# Patient Record
Sex: Male | Born: 1980 | Race: White | Hispanic: No | Marital: Single | Smoking: Current every day smoker
Health system: Southern US, Community
[De-identification: ages and names within clinical notes are randomized; demographics above are authoritative.]

## PROBLEM LIST (undated history)

## (undated) VITALS — BP 120/79 | HR 101 | Temp 98.5°F | Resp 18 | Ht 68.0 in | Wt 196.0 lb

## (undated) DIAGNOSIS — Z21 Asymptomatic human immunodeficiency virus [HIV] infection status: Secondary | ICD-10-CM

## (undated) DIAGNOSIS — Z9089 Acquired absence of other organs: Secondary | ICD-10-CM

## (undated) DIAGNOSIS — S3282XA Multiple fractures of pelvis without disruption of pelvic ring, initial encounter for closed fracture: Secondary | ICD-10-CM

## (undated) DIAGNOSIS — Z8781 Personal history of (healed) traumatic fracture: Secondary | ICD-10-CM

## (undated) DIAGNOSIS — Z87828 Personal history of other (healed) physical injury and trauma: Secondary | ICD-10-CM

## (undated) DIAGNOSIS — F1120 Opioid dependence, uncomplicated: Secondary | ICD-10-CM

## (undated) DIAGNOSIS — B20 Human immunodeficiency virus [HIV] disease: Secondary | ICD-10-CM

## (undated) DIAGNOSIS — M1990 Unspecified osteoarthritis, unspecified site: Secondary | ICD-10-CM

## (undated) DIAGNOSIS — F32A Depression, unspecified: Secondary | ICD-10-CM

## (undated) DIAGNOSIS — N200 Calculus of kidney: Secondary | ICD-10-CM

## (undated) DIAGNOSIS — F329 Major depressive disorder, single episode, unspecified: Secondary | ICD-10-CM

## (undated) DIAGNOSIS — IMO0002 Reserved for concepts with insufficient information to code with codable children: Secondary | ICD-10-CM

## (undated) DIAGNOSIS — Z5189 Encounter for other specified aftercare: Secondary | ICD-10-CM

## (undated) DIAGNOSIS — F99 Mental disorder, not otherwise specified: Secondary | ICD-10-CM

## (undated) HISTORY — PX: HIP FRACTURE SURGERY: SHX118

## (undated) HISTORY — DX: Personal history of (healed) traumatic fracture: Z87.81

## (undated) HISTORY — DX: Personal history of other (healed) physical injury and trauma: Z87.828

## (undated) HISTORY — DX: Acquired absence of other organs: Z90.89

## (undated) HISTORY — DX: Multiple fractures of pelvis without disruption of pelvic ring, initial encounter for closed fracture: S32.82XA

## (undated) HISTORY — PX: SPLENECTOMY: SUR1306

## (undated) HISTORY — DX: Reserved for concepts with insufficient information to code with codable children: IMO0002

## (undated) HISTORY — PX: TONSILLECTOMY: SUR1361

---
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
## 2005-05-19 DIAGNOSIS — IMO0001 Reserved for inherently not codable concepts without codable children: Secondary | ICD-10-CM

## 2005-05-19 DIAGNOSIS — Z5189 Encounter for other specified aftercare: Secondary | ICD-10-CM

## 2005-05-19 HISTORY — DX: Reserved for inherently not codable concepts without codable children: IMO0001

## 2005-05-19 HISTORY — DX: Encounter for other specified aftercare: Z51.89

## 2008-05-06 ENCOUNTER — Emergency Department (HOSPITAL_BASED_OUTPATIENT_CLINIC_OR_DEPARTMENT_OTHER): Admission: EM | Admit: 2008-05-06 | Discharge: 2008-05-06 | Payer: Self-pay | Admitting: Emergency Medicine

## 2008-05-06 ENCOUNTER — Ambulatory Visit: Payer: Self-pay | Admitting: Diagnostic Radiology

## 2009-05-09 ENCOUNTER — Ambulatory Visit: Payer: Self-pay | Admitting: Diagnostic Radiology

## 2009-05-09 ENCOUNTER — Ambulatory Visit (HOSPITAL_BASED_OUTPATIENT_CLINIC_OR_DEPARTMENT_OTHER): Admission: RE | Admit: 2009-05-09 | Discharge: 2009-05-09 | Payer: Self-pay | Admitting: Family Medicine

## 2009-08-09 ENCOUNTER — Emergency Department (HOSPITAL_BASED_OUTPATIENT_CLINIC_OR_DEPARTMENT_OTHER): Admission: EM | Admit: 2009-08-09 | Discharge: 2009-08-10 | Payer: Self-pay | Admitting: Emergency Medicine

## 2009-08-10 ENCOUNTER — Ambulatory Visit: Payer: Self-pay | Admitting: Interventional Radiology

## 2010-01-07 ENCOUNTER — Ambulatory Visit: Payer: Self-pay | Admitting: Diagnostic Radiology

## 2010-01-07 ENCOUNTER — Emergency Department (HOSPITAL_BASED_OUTPATIENT_CLINIC_OR_DEPARTMENT_OTHER): Admission: EM | Admit: 2010-01-07 | Discharge: 2010-01-07 | Payer: Self-pay | Admitting: Emergency Medicine

## 2010-08-02 LAB — POCT CARDIAC MARKERS
CKMB, poc: <1 ng/mL — ABNORMAL LOW (ref 1.0–8.0)
Myoglobin, poc: 36.4 ng/mL (ref 12–200)
Troponin i, poc: <0.05 ng/mL (ref 0.00–0.09)

## 2010-08-02 LAB — DIFFERENTIAL
Basophils Relative: 3 % — ABNORMAL HIGH (ref 0–1)
Eosinophils Absolute: 0.1 10*3/uL (ref 0.0–0.7)
Eosinophils Relative: 2 % (ref 0–5)
Lymphocytes Relative: 52 % — ABNORMAL HIGH (ref 12–46)
Monocytes Absolute: 0.4 10*3/uL (ref 0.1–1.0)
Monocytes Relative: 6 % (ref 3–12)
Neutrophils Relative %: 37 % — ABNORMAL LOW (ref 43–77)

## 2010-08-02 LAB — BASIC METABOLIC PANEL
BUN: 14 mg/dL (ref 6–23)
CO2: 28 mEq/L (ref 19–32)
Calcium: 9.2 mg/dL (ref 8.4–10.5)
Creatinine, Ser: 1 mg/dL (ref 0.4–1.5)
GFR calc Af Amer: >60 mL/min (ref >60)
Glucose, Bld: 75 mg/dL (ref 70–99)
Potassium: 3.8 mEq/L (ref 3.5–5.1)

## 2010-08-02 LAB — CBC
Hemoglobin: 15.4 g/dL (ref 13.0–17.0)
Platelets: 220 10*3/uL (ref 150–400)
RDW: 13.2 % (ref 11.5–15.5)

## 2010-08-12 LAB — BASIC METABOLIC PANEL
CO2: 27 mEq/L (ref 19–32)
GFR calc Af Amer: >60 mL/min (ref >60)
Glucose, Bld: 86 mg/dL (ref 70–99)
Sodium: 145 mEq/L (ref 135–145)

## 2010-08-12 LAB — CBC
HCT: 42.1 % (ref 39.0–52.0)
Hemoglobin: 14.5 g/dL (ref 13.0–17.0)
MCHC: 34.5 g/dL (ref 30.0–36.0)
MCV: 99.7 fL (ref 78.0–100.0)
Platelets: 238 10*3/uL (ref 150–400)
RBC: 4.22 MIL/uL (ref 4.22–5.81)
RDW: 12.8 % (ref 11.5–15.5)
WBC: 9.4 K/uL (ref 4.0–10.5)

## 2010-08-12 LAB — URINALYSIS, ROUTINE W REFLEX MICROSCOPIC
Bilirubin Urine: NEGATIVE
Glucose, UA: NEGATIVE mg/dL
Hgb urine dipstick: NEGATIVE
Ketones, ur: NEGATIVE mg/dL
Nitrite: NEGATIVE
Protein, ur: NEGATIVE mg/dL
Specific Gravity, Urine: 1.012 (ref 1.005–1.030)
Urobilinogen, UA: 1 mg/dL (ref 0.0–1.0)
pH: 7 (ref 5.0–8.0)

## 2010-08-12 LAB — DIFFERENTIAL
Basophils Absolute: 0.1 K/uL (ref 0.0–0.1)
Basophils Relative: 1 % (ref 0–1)
Eosinophils Absolute: 0.1 K/uL (ref 0.0–0.7)
Eosinophils Relative: 1 % (ref 0–5)
Lymphocytes Relative: 39 % (ref 12–46)
Lymphs Abs: 3.6 10*3/uL (ref 0.7–4.0)
Monocytes Absolute: 0.7 10*3/uL (ref 0.1–1.0)
Monocytes Relative: 8 % (ref 3–12)
Neutro Abs: 4.9 10*3/uL (ref 1.7–7.7)
Neutrophils Relative %: 52 % (ref 43–77)

## 2010-08-12 LAB — BASIC METABOLIC PANEL WITH GFR
BUN: 10 mg/dL (ref 6–23)
Calcium: 9.3 mg/dL (ref 8.4–10.5)
Chloride: 108 meq/L (ref 96–112)
Creatinine, Ser: 1 mg/dL (ref 0.4–1.5)
GFR calc non Af Amer: >60 mL/min (ref >60)
Potassium: 3.9 meq/L (ref 3.5–5.1)

## 2011-02-18 ENCOUNTER — Encounter: Payer: Self-pay | Admitting: *Deleted

## 2011-02-18 ENCOUNTER — Emergency Department (HOSPITAL_BASED_OUTPATIENT_CLINIC_OR_DEPARTMENT_OTHER)
Admission: EM | Admit: 2011-02-18 | Discharge: 2011-02-18 | Disposition: A | Discharge status: Home / Self Care | Payer: 59 | Attending: Emergency Medicine | Admitting: Emergency Medicine

## 2011-02-18 DIAGNOSIS — Z21 Asymptomatic human immunodeficiency virus [HIV] infection status: Secondary | ICD-10-CM | POA: Insufficient documentation

## 2011-02-18 DIAGNOSIS — IMO0001 Reserved for inherently not codable concepts without codable children: Secondary | ICD-10-CM | POA: Insufficient documentation

## 2011-02-18 DIAGNOSIS — F1120 Opioid dependence, uncomplicated: Secondary | ICD-10-CM | POA: Insufficient documentation

## 2011-02-18 DIAGNOSIS — F17200 Nicotine dependence, unspecified, uncomplicated: Secondary | ICD-10-CM | POA: Insufficient documentation

## 2011-02-18 DIAGNOSIS — R110 Nausea: Secondary | ICD-10-CM | POA: Insufficient documentation

## 2011-02-18 HISTORY — DX: Opioid dependence, uncomplicated: F11.20

## 2011-02-18 HISTORY — DX: Asymptomatic human immunodeficiency virus (hiv) infection status: Z21

## 2011-02-18 HISTORY — DX: Human immunodeficiency virus (HIV) disease: B20

## 2011-02-18 LAB — URINALYSIS, ROUTINE W REFLEX MICROSCOPIC
Bilirubin Urine: NEGATIVE
Hgb urine dipstick: NEGATIVE
Specific Gravity, Urine: 1.01 (ref 1.005–1.030)
pH: 7.5 (ref 5.0–8.0)

## 2011-02-18 LAB — RAPID URINE DRUG SCREEN, HOSP PERFORMED
Barbiturates: NOT DETECTED
Tetrahydrocannabinol: NOT DETECTED

## 2011-02-18 MED ORDER — OXYCODONE-ACETAMINOPHEN 5-325 MG PO TABS
2.0000 | ORAL_TABLET | Freq: Once | ORAL | Status: AC
  Administered 2011-02-18: 2 via ORAL
  Filled 2011-02-18: qty 2

## 2011-02-18 MED ORDER — OXYCODONE-ACETAMINOPHEN 5-325 MG PO TABS: 1.0000 | ORAL_TABLET | Freq: Three times a day (TID) | ORAL | Status: AC

## 2011-02-18 NOTE — ED Notes (Signed)
Pt. To see PA Lovenia Kim tomarrow.  Pt. In no distress and is not suicidal per Pt.

## 2011-02-18 NOTE — ED Provider Notes (Signed)
History     CSN: [id number] Arrival date & time: 02/18/2011 12:47 PM  Chief Complaint  Patient presents with  . Addiction Problem    (Consider location/radiation/quality/duration/timing/severity/associated sxs/prior treatment) HPI Comments: Pt states that he has been on percocet 10 three times a day for 1 year and 3 days ago he decided he didn't want to take them anymore so he flushed time down the toilet:pt states that feels horrible and he is supposed to see his doctor tomorrow, but with how anxious he felt today he thought he needed to be seen  Patient is a 30 y.o. male presenting with drug problem. The history is provided by the patient. No language interpreter was used.  Drug Problem This is a new problem. The current episode started in the past 7 days. The problem occurs constantly. Associated symptoms include diaphoresis, myalgias and nausea. Pertinent negatives include no vomiting. The symptoms are aggravated by nothing. He has tried nothing for the symptoms.  Drug Problem This is a new problem. The current episode started in the past 7 days. The problem occurs constantly. The symptoms are aggravated by nothing. He has tried nothing for the symptoms.    Past Medical History  Diagnosis Date  . HIV (human immunodeficiency virus infection)   . Narcotic addiction     Past Surgical History  Procedure Date  . Splenectomy     No family history on file.  History  Substance Use Topics  . Smoking status: Current Some Day Smoker  . Smokeless tobacco: Not on file  . Alcohol Use: No      Review of Systems  Constitutional: Positive for diaphoresis.  Gastrointestinal: Positive for nausea. Negative for vomiting.  Musculoskeletal: Positive for myalgias.  All other systems reviewed and are negative.    Allergies  Review of patient's allergies indicates not on file.  Home Medications  No current outpatient prescriptions on file.  BP 128/92  Pulse 133  Temp(Src) 98.3 F  (36.8 C) (Oral)  Resp 24  SpO2 99%  Physical Exam  Nursing note and vitals reviewed. Constitutional: He is oriented to person, place, and time. He appears well-developed and well-nourished.  HENT:  Head: Normocephalic and atraumatic.  Eyes: Pupils are equal, round, and reactive to light.  Cardiovascular: Normal rate and regular rhythm.   Pulmonary/Chest: Effort normal and breath sounds normal.  Abdominal: Soft. Bowel sounds are normal.  Musculoskeletal: Normal range of motion.  Neurological: He is alert and oriented to person, place, and time.  Skin: He is diaphoretic.  Psychiatric: His mood appears anxious.    ED Course  Procedures (including critical care time)  Labs Reviewed  URINE RAPID DRUG SCREEN (HOSP PERFORMED) - Abnormal; Notable for the following:    Benzodiazepines POSITIVE (*)    All other components within normal limits  URINALYSIS, ROUTINE W REFLEX MICROSCOPIC   No results found.   No diagnosis found.    MDM  Pt is feeling better after the medications:pt is supposed to see pcp tomorrow as I talked with pcp and they are to come up with a plan for the pt to get of the medications        Teressa Lower, NP 02/18/11 1456  Medical screening examination/treatment/procedure(s) were performed by non-physician practitioner and as supervising physician I was immediately available for consultation/collaboration.  Ethelda Chick, MD 02/18/11 [phone]

## 2011-02-18 NOTE — ED Notes (Signed)
States he is trying to get off Percocet. Has been taking 3 tablets everyday for the past year. Has not had any x 3 days. Feels shaky and anxious.

## 2011-02-18 NOTE — ED Notes (Signed)
Md canceled blood order

## 2011-02-21 LAB — RAPID STREP SCREEN (MED CTR MEBANE ONLY): Streptococcus, Group A Screen (Direct): NEGATIVE

## 2011-06-04 ENCOUNTER — Other Ambulatory Visit: Payer: Self-pay

## 2011-06-04 ENCOUNTER — Encounter (HOSPITAL_BASED_OUTPATIENT_CLINIC_OR_DEPARTMENT_OTHER): Payer: Self-pay | Admitting: *Deleted

## 2011-06-04 ENCOUNTER — Emergency Department (HOSPITAL_BASED_OUTPATIENT_CLINIC_OR_DEPARTMENT_OTHER)
Admission: EM | Admit: 2011-06-04 | Discharge: 2011-06-05 | Disposition: A | Discharge status: Home / Self Care | Payer: 59 | Attending: Emergency Medicine | Admitting: Emergency Medicine

## 2011-06-04 ENCOUNTER — Emergency Department (INDEPENDENT_AMBULATORY_CARE_PROVIDER_SITE_OTHER): Payer: 59

## 2011-06-04 DIAGNOSIS — F3289 Other specified depressive episodes: Secondary | ICD-10-CM | POA: Insufficient documentation

## 2011-06-04 DIAGNOSIS — R45851 Suicidal ideations: Secondary | ICD-10-CM

## 2011-06-04 DIAGNOSIS — F1910 Other psychoactive substance abuse, uncomplicated: Secondary | ICD-10-CM

## 2011-06-04 DIAGNOSIS — Z0389 Encounter for observation for other suspected diseases and conditions ruled out: Secondary | ICD-10-CM

## 2011-06-04 DIAGNOSIS — G8929 Other chronic pain: Secondary | ICD-10-CM | POA: Insufficient documentation

## 2011-06-04 DIAGNOSIS — F19939 Other psychoactive substance use, unspecified with withdrawal, unspecified: Secondary | ICD-10-CM | POA: Insufficient documentation

## 2011-06-04 DIAGNOSIS — Z79899 Other long term (current) drug therapy: Secondary | ICD-10-CM | POA: Insufficient documentation

## 2011-06-04 DIAGNOSIS — F1920 Other psychoactive substance dependence, uncomplicated: Secondary | ICD-10-CM | POA: Insufficient documentation

## 2011-06-04 DIAGNOSIS — Z21 Asymptomatic human immunodeficiency virus [HIV] infection status: Secondary | ICD-10-CM | POA: Insufficient documentation

## 2011-06-04 DIAGNOSIS — F329 Major depressive disorder, single episode, unspecified: Secondary | ICD-10-CM

## 2011-06-04 LAB — URINALYSIS, ROUTINE W REFLEX MICROSCOPIC
Ketones, ur: NEGATIVE mg/dL
Leukocytes, UA: NEGATIVE
Nitrite: NEGATIVE
Protein, ur: NEGATIVE mg/dL

## 2011-06-04 LAB — COMPREHENSIVE METABOLIC PANEL
ALT: 15 U/L (ref 0–53)
AST: 16 U/L (ref 0–37)
CO2: 25 mEq/L (ref 19–32)
Calcium: 9.6 mg/dL (ref 8.4–10.5)
Sodium: 141 mEq/L (ref 135–145)
Total Protein: 7.3 g/dL (ref 6.0–8.3)

## 2011-06-04 LAB — CBC
MCV: 95 fL (ref 78.0–100.0)
Platelets: 283 10*3/uL (ref 150–400)
RDW: 13.2 % (ref 11.5–15.5)
WBC: 12.3 10*3/uL — ABNORMAL HIGH (ref 4.0–10.5)

## 2011-06-04 LAB — DIFFERENTIAL
Basophils Absolute: 0 10*3/uL (ref 0.0–0.1)
Lymphs Abs: 7 10*3/uL — ABNORMAL HIGH (ref 0.7–4.0)
Monocytes Relative: 4 % (ref 3–12)
Neutrophils Relative %: 38 % — ABNORMAL LOW (ref 43–77)

## 2011-06-04 LAB — RAPID URINE DRUG SCREEN, HOSP PERFORMED
Amphetamines: NOT DETECTED
Barbiturates: NOT DETECTED
Benzodiazepines: POSITIVE — AB

## 2011-06-04 MED ORDER — ACETAMINOPHEN 325 MG PO TABS
650.0000 mg | ORAL_TABLET | Freq: Once | ORAL | Status: AC
  Administered 2011-06-04: 650 mg via ORAL
  Filled 2011-06-04: qty 2

## 2011-06-04 MED ORDER — LORAZEPAM 1 MG PO TABS
1.0000 mg | ORAL_TABLET | Freq: Once | ORAL | Status: AC
  Administered 2011-06-04: 1 mg via ORAL
  Filled 2011-06-04: qty 1
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
## 2011-06-04 NOTE — ED Notes (Signed)
[facility] here to evaluate pt now.

## 2011-06-04 NOTE — ED Notes (Signed)
Pt also sts he is having thoughts "that I wold be better off if I weren't here." Pt denies plan or desire to actually harm himself.

## 2011-06-04 NOTE — ED Notes (Signed)
Spoke with Dewayne Hatch from [facility]. Pt information given and will await return call.

## 2011-06-04 NOTE — ED Notes (Signed)
MD in to evaluate pt now.

## 2011-06-04 NOTE — ED Notes (Signed)
Pt sts he is addicted to percocet and wants to get off of them. He sts he ran out and is having body aches, nausea and sweats. Pt sts he called his PMD for a referral but he was not in the office.
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
## 2011-06-05 DIAGNOSIS — R45851 Suicidal ideations: Secondary | ICD-10-CM

## 2011-06-05 NOTE — ED Provider Notes (Signed)
12:40 AM   Has been seen by counsellor.  NO SI or HI.  Interested in pain mgt. Program and will see his pmd tomorrow.    Also has outpt psychiatrist.  Stable for dc.    Twylia Oka A. Patrica Duel, MD 06/05/11 0040

## 2011-06-05 NOTE — ED Notes (Signed)
[facility] counselor in with pt at this time

## 2011-06-27 ENCOUNTER — Other Ambulatory Visit: Payer: Self-pay | Admitting: Urology

## 2011-06-30 ENCOUNTER — Encounter (HOSPITAL_COMMUNITY): Payer: Self-pay | Admitting: *Deleted

## 2011-06-30 NOTE — Pre-Procedure Instructions (Signed)
Arrive in [facility] at 1530 on day of procedure,patient to follow laxative instructions in blue folder. Npo after midnight for solid food, clear liquids until 0900 .am on day of procedure then npo except sip with meds. Not to take any aspirin , ibuprofen, vitamins ,mobic until after the procedure. To bring driver, picture ID, insurance info, blue folder to short stay.. patient verbalized  Understanding of instructions.

## 2011-07-03 ENCOUNTER — Encounter (HOSPITAL_COMMUNITY)
Admission: RE | Disposition: A | Discharge status: Home / Self Care | Payer: Self-pay | Source: Ambulatory Visit | Attending: Urology

## 2011-07-03 ENCOUNTER — Ambulatory Visit (HOSPITAL_COMMUNITY): Payer: 59

## 2011-07-03 ENCOUNTER — Ambulatory Visit (HOSPITAL_COMMUNITY)
Admission: RE | Admit: 2011-07-03 | Discharge: 2011-07-03 | Disposition: A | Discharge status: Home / Self Care | Payer: 59 | Source: Ambulatory Visit | Attending: Urology | Admitting: Urology

## 2011-07-03 ENCOUNTER — Encounter (HOSPITAL_COMMUNITY): Payer: Self-pay | Admitting: *Deleted

## 2011-07-03 DIAGNOSIS — Z87891 Personal history of nicotine dependence: Secondary | ICD-10-CM | POA: Insufficient documentation

## 2011-07-03 DIAGNOSIS — F329 Major depressive disorder, single episode, unspecified: Secondary | ICD-10-CM | POA: Insufficient documentation

## 2011-07-03 DIAGNOSIS — F411 Generalized anxiety disorder: Secondary | ICD-10-CM | POA: Insufficient documentation

## 2011-07-03 DIAGNOSIS — B20 Human immunodeficiency virus [HIV] disease: Secondary | ICD-10-CM | POA: Insufficient documentation

## 2011-07-03 DIAGNOSIS — R12 Heartburn: Secondary | ICD-10-CM | POA: Insufficient documentation

## 2011-07-03 DIAGNOSIS — N200 Calculus of kidney: Secondary | ICD-10-CM | POA: Insufficient documentation

## 2011-07-03 DIAGNOSIS — Z79899 Other long term (current) drug therapy: Secondary | ICD-10-CM | POA: Insufficient documentation

## 2011-07-03 DIAGNOSIS — F3289 Other specified depressive episodes: Secondary | ICD-10-CM | POA: Insufficient documentation

## 2011-07-03 HISTORY — DX: Calculus of kidney: N20.0

## 2011-07-03 HISTORY — DX: Encounter for other specified aftercare: Z51.89

## 2011-07-03 HISTORY — DX: Unspecified osteoarthritis, unspecified site: M19.90

## 2011-07-03 SURGERY — LITHOTRIPSY, ESWL
Anesthesia: LOCAL | Laterality: Left

## 2011-07-03 MED ORDER — CIPROFLOXACIN HCL 500 MG PO TABS
500.0000 mg | ORAL_TABLET | ORAL | Status: AC
  Administered 2011-07-03: 500 mg via ORAL

## 2011-07-03 MED ORDER — DIPHENHYDRAMINE HCL 25 MG PO CAPS
25.0000 mg | ORAL_CAPSULE | Freq: Once | ORAL | Status: AC
  Administered 2011-07-03: 25 mg via ORAL

## 2011-07-03 MED ORDER — DEXTROSE-NACL 5-0.45 % IV SOLN
Freq: Once | INTRAVENOUS | Status: AC
  Administered 2011-07-03: 16:00:00 via INTRAVENOUS

## 2011-07-03 MED ORDER — DIAZEPAM 5 MG PO TABS
10.0000 mg | ORAL_TABLET | Freq: Once | ORAL | Status: AC
  Administered 2011-07-03: 10 mg via ORAL

## 2011-07-03 MED ORDER — DIAZEPAM 5 MG PO TABS: 10.0000 mg | ORAL_TABLET | ORAL | Status: DC

## 2011-07-03 MED ORDER — DEXTROSE-NACL 5-0.45 % IV SOLN: INTRAVENOUS | Status: DC

## 2011-07-03 MED ORDER — DIPHENHYDRAMINE HCL 25 MG PO CAPS
ORAL_CAPSULE | ORAL | Status: AC
  Administered 2011-07-03: 25 mg via ORAL
  Filled 2011-07-03: qty 1

## 2011-07-03 MED ORDER — DIPHENHYDRAMINE HCL 25 MG PO CAPS: 25.0000 mg | ORAL_CAPSULE | ORAL | Status: DC

## 2011-07-03 MED ORDER — DIAZEPAM 5 MG PO TABS
ORAL_TABLET | ORAL | Status: AC
  Administered 2011-07-03: 10 mg via ORAL
  Filled 2011-07-03: qty 2

## 2011-07-03 MED ORDER — CIPROFLOXACIN HCL 500 MG PO TABS: 500.0000 mg | ORAL_TABLET | ORAL | Status: DC

## 2011-07-03 MED ORDER — CIPROFLOXACIN HCL 500 MG PO TABS
ORAL_TABLET | ORAL | Status: AC
  Administered 2011-07-03: 500 mg via ORAL
  Filled 2011-07-03: qty 1

## 2011-07-03 NOTE — H&P (Signed)
History of Present Illness  Devin Crawford was seen in the ER for moderately severe left flank pain on 1/30.  CT scan revealed a 1.3 cm calculus in the upper pole of the left kidney.  He had similar symptom in March 2012.  CT scan then showed a 9 mm left upper pole renal calculus.  He still complains of left flank pain associated with frequency, dysuria.  Urinalysis shows 21-50 RBC's, 0-3 WBC's.  I reviewed the CT scan from a year ago.  I will request a copy of the most recent CT at [facility].   Past Medical History Problems  1. History of  Anxiety (Symptom) 300.00 2. History of  Depression 311 3. History of  Heartburn 787.1 4. History of  HIV Infection 042  Surgical History Problems  1. History of  Splenectomy  Current Meds 1. Abilify 2 MG Oral Tablet; Therapy: (Recorded:07Feb2013) to 2. Etodolac 400 MG Oral Tablet; Therapy: (Recorded:07Feb2013) to 3. FentaNYL 25 MCG/HR Transdermal Patch 72 Hour; Therapy: (Recorded:07Feb2013) to 4. Kaletra 200-50 MG Oral Tablet; Therapy: (Recorded:07Feb2013) to 5. Multi-Vitamin TABS; Therapy: (Recorded:07Feb2013) to 6. Omeprazole 40 MG Oral Capsule Delayed Release; Therapy: (Recorded:17Jan2011) to 7. Truvada 200-300 MG Oral Tablet; Therapy: (Recorded:07Feb2013) to 8. Tylenol TABS; Therapy: (Recorded:07Feb2013) to 9. Xanax 1 MG Oral Tablet; Therapy: (Recorded:07Feb2013) to 10. Zoloft 100 MG Oral Tablet; Therapy: (Recorded:07Feb2013) to  Allergies Medication  1. Morphine Derivatives 2. Penicillins 3. Sulfa Drugs  Family History Problems  1. Paternal history of  Family Health Status - Father's Age 40 2. Paternal history of  Family Health Status - Mother's Age 72 3. Family history of  Nephrolithiasis  Social History Problems  1. Caffeine Use 2-3 per day 2. Marital History - Single 3. Tobacco Use 305.1 1 pk in 2 wks, quit 1 yr ago Denied  4. History of  Alcohol Use  Review of Systems  As per HPI.  Everything else is unchanged since  06/04/09.     Vitals Vital Signs [Data Includes: Last 1 Day]  07Feb2013 10:25AM  BMI Calculated: 28.09 BSA Calculated: 1.93 Height: 5 ft 7 in Weight: 179 lb  Blood Pressure: 130 / 85 Heart Rate: 112 Respiration: 18  Physical Exam Constitutional: Well nourished and well developed . No acute distress.  ENT:. The ears and nose are normal in appearance.  Neck: The appearance of the neck is normal and no neck mass is present.  Pulmonary: No respiratory distress and normal respiratory rhythm and effort.  Cardiovascular: Heart rate and rhythm are normal . No peripheral edema.  Abdomen: The abdomen is soft and nontender. No masses are palpated. No CVA tenderness. No hernias are palpable. No hepatosplenomegaly noted.  Genitourinary: Examination of the penis demonstrates a normal meatus. The penis is circumcised. The scrotum is normal in appearance. The right epididymis is palpably normal. The left epididymis is enlarged. The right testis is palpably normal. The left testis is normal.  Lymphatics: The femoral and inguinal nodes are not enlarged or tender.  Skin: Normal skin turgor, no visible rash and no visible skin lesions.  Neuro/Psych:. Mood and affect are appropriate.    Results/Data Urine [Data Includes: Last 1 Day]   07Feb2013  COLOR YELLOW   APPEARANCE CLEAR   SPECIFIC GRAVITY 1.025   pH 6.0   GLUCOSE NEG mg/dL  BILIRUBIN NEG   KETONE NEG mg/dL  BLOOD LARGE   PROTEIN TRACE mg/dL  UROBILINOGEN 0.2 mg/dL  NITRITE NEG   LEUKOCYTE ESTERASE NEG   SQUAMOUS EPITHELIAL/HPF RARE  WBC 0-3 WBC/hpf  RBC 21-50 RBC/hpf  BACTERIA RARE   CRYSTALS Calcium Oxalate crystals noted   CASTS NONE SEEN    Assessment Assessed  1. Nephrolithiasis Of The Left Kidney 592.0 2. Ultrasound Scrotal Epididymis Left Cyst (___ Cm)  Plan Health Maintenance (V70.0)  1. UA With REFLEX  Done: 07Feb2013 10:07AM Nephrolithiasis Of The Left Kidney (592.0)  2. Hydrocodone-Acetaminophen 5-500 MG Oral  Tablet; Take by mouth 1 OR 2 tablets every 4-6  hours as needed for pain; Therapy: 07Feb2013 to (Last Rx:07Feb2013) 3. Follow-up Schedule Surgery Office  Follow-up  Done: 07Feb2013   He needs ESL of the renal calculus.  The procedure, risks, benefits were explained to the patient.  The risks include but are not limited to hemorrhage, renal or perirenal hematoma, injury to adjacent organs, steinstrasse.  He understands and wishes to proceed.   Signatures  CC: Dr Silvestre Moment  Electronically signed by : Su Grand, M.D.; Jun 26 2011 11:09AM

## 2011-07-03 NOTE — Discharge Instructions (Signed)

## 2011-07-05 ENCOUNTER — Encounter (HOSPITAL_COMMUNITY): Payer: Self-pay | Admitting: *Deleted

## 2011-07-05 ENCOUNTER — Observation Stay (HOSPITAL_COMMUNITY)
Admission: EM | Admit: 2011-07-05 | Discharge: 2011-07-06 | DRG: 669 | Disposition: A | Discharge status: Home / Self Care | Payer: 59 | Attending: Urology | Admitting: Urology

## 2011-07-05 DIAGNOSIS — Z7982 Long term (current) use of aspirin: Secondary | ICD-10-CM | POA: Insufficient documentation

## 2011-07-05 DIAGNOSIS — G8929 Other chronic pain: Secondary | ICD-10-CM | POA: Insufficient documentation

## 2011-07-05 DIAGNOSIS — Z21 Asymptomatic human immunodeficiency virus [HIV] infection status: Secondary | ICD-10-CM | POA: Insufficient documentation

## 2011-07-05 DIAGNOSIS — R109 Unspecified abdominal pain: Secondary | ICD-10-CM | POA: Insufficient documentation

## 2011-07-05 DIAGNOSIS — R112 Nausea with vomiting, unspecified: Secondary | ICD-10-CM | POA: Insufficient documentation

## 2011-07-05 DIAGNOSIS — N1330 Unspecified hydronephrosis: Secondary | ICD-10-CM | POA: Insufficient documentation

## 2011-07-05 DIAGNOSIS — Z79899 Other long term (current) drug therapy: Secondary | ICD-10-CM | POA: Insufficient documentation

## 2011-07-05 DIAGNOSIS — R000 Tachycardia, unspecified: Secondary | ICD-10-CM | POA: Insufficient documentation

## 2011-07-05 DIAGNOSIS — N201 Calculus of ureter: Principal | ICD-10-CM | POA: Insufficient documentation

## 2011-07-05 DIAGNOSIS — Z880 Allergy status to penicillin: Secondary | ICD-10-CM | POA: Insufficient documentation

## 2011-07-05 DIAGNOSIS — Z885 Allergy status to narcotic agent status: Secondary | ICD-10-CM | POA: Insufficient documentation

## 2011-07-05 DIAGNOSIS — F17200 Nicotine dependence, unspecified, uncomplicated: Secondary | ICD-10-CM | POA: Insufficient documentation

## 2011-07-05 DIAGNOSIS — N200 Calculus of kidney: Secondary | ICD-10-CM | POA: Insufficient documentation

## 2011-07-05 NOTE — ED Notes (Signed)
Pt c/o L flank pain and nausea since 2000. Pt is s/p lithotripsy x 2 days ago.

## 2011-07-06 ENCOUNTER — Encounter (HOSPITAL_COMMUNITY)
Admission: EM | Disposition: A | Discharge status: Home / Self Care | Payer: Self-pay | Source: Home / Self Care | Attending: Emergency Medicine

## 2011-07-06 ENCOUNTER — Emergency Department (HOSPITAL_COMMUNITY): Payer: 59

## 2011-07-06 ENCOUNTER — Encounter (HOSPITAL_COMMUNITY): Payer: Self-pay | Admitting: Certified Registered Nurse Anesthetist

## 2011-07-06 ENCOUNTER — Inpatient Hospital Stay (HOSPITAL_COMMUNITY): Payer: 59 | Admitting: Certified Registered Nurse Anesthetist

## 2011-07-06 LAB — URINE MICROSCOPIC-ADD ON

## 2011-07-06 LAB — BASIC METABOLIC PANEL
BUN: 10 mg/dL (ref 6–23)
Chloride: 101 mEq/L (ref 96–112)
Creatinine, Ser: 1.07 mg/dL (ref 0.50–1.35)
GFR calc Af Amer: >90 mL/min (ref >90)

## 2011-07-06 LAB — URINALYSIS, ROUTINE W REFLEX MICROSCOPIC
Bilirubin Urine: NEGATIVE
Ketones, ur: NEGATIVE mg/dL
Nitrite: NEGATIVE
Urobilinogen, UA: 0.2 mg/dL (ref 0.0–1.0)
pH: 6.5 (ref 5.0–8.0)

## 2011-07-06 SURGERY — CYSTOURETEROSCOPY, WITH RETROGRADE PYELOGRAM AND STENT INSERTION
Anesthesia: General | Site: Ureter | Laterality: Left | Wound class: Clean Contaminated

## 2011-07-06 MED ORDER — EMTRICITABINE-TENOFOVIR DF 200-300 MG PO TABS
1.0000 | ORAL_TABLET | Freq: Every day | ORAL | Status: DC
  Filled 2011-07-06 (×2): qty 1

## 2011-07-06 MED ORDER — CIPROFLOXACIN HCL 500 MG PO TABS: 500.0000 mg | ORAL_TABLET | Freq: Two times a day (BID) | ORAL | Status: AC

## 2011-07-06 MED ORDER — CIPROFLOXACIN IN D5W 400 MG/200ML IV SOLN
400.0000 mg | Freq: Once | INTRAVENOUS | Status: AC
  Administered 2011-07-06: 400 mg via INTRAVENOUS
  Filled 2011-07-06: qty 200

## 2011-07-06 MED ORDER — MELOXICAM 7.5 MG PO TABS
7.5000 mg | ORAL_TABLET | Freq: Every day | ORAL | Status: DC
  Filled 2011-07-06 (×2): qty 1

## 2011-07-06 MED ORDER — FENTANYL CITRATE 0.05 MG/ML IJ SOLN
INTRAMUSCULAR | Status: DC | PRN
  Administered 2011-07-06 (×2): 50 ug via INTRAVENOUS

## 2011-07-06 MED ORDER — HYDROMORPHONE HCL PF 1 MG/ML IJ SOLN
1.0000 mg | Freq: Once | INTRAMUSCULAR | Status: AC
  Administered 2011-07-06: 1 mg via INTRAVENOUS
  Filled 2011-07-06: qty 1

## 2011-07-06 MED ORDER — ALPRAZOLAM 0.5 MG PO TABS
ORAL_TABLET | ORAL | Status: AC
  Administered 2011-07-06: 0.5 mg
  Filled 2011-07-06: qty 1

## 2011-07-06 MED ORDER — HYDROMORPHONE HCL PF 1 MG/ML IJ SOLN: 1.0000 mg | INTRAMUSCULAR | Status: DC | PRN

## 2011-07-06 MED ORDER — PROMETHAZINE HCL 25 MG/ML IJ SOLN: 6.2500 mg | INTRAMUSCULAR | Status: DC | PRN

## 2011-07-06 MED ORDER — ONDANSETRON HCL 4 MG/2ML IJ SOLN
4.0000 mg | Freq: Once | INTRAMUSCULAR | Status: AC
  Administered 2011-07-06: 4 mg via INTRAVENOUS

## 2011-07-06 MED ORDER — LACTATED RINGERS IV SOLN
INTRAVENOUS | Status: DC | PRN
  Administered 2011-07-06: 14:00:00 via INTRAVENOUS

## 2011-07-06 MED ORDER — CIPROFLOXACIN IN D5W 400 MG/200ML IV SOLN
INTRAVENOUS | Status: DC | PRN
  Administered 2011-07-06: 400 mg via INTRAVENOUS

## 2011-07-06 MED ORDER — LIDOCAINE HCL (CARDIAC) 20 MG/ML IV SOLN
INTRAVENOUS | Status: DC | PRN
  Administered 2011-07-06: 100 mg via INTRAVENOUS

## 2011-07-06 MED ORDER — SODIUM CHLORIDE 0.9 % IR SOLN
Status: DC | PRN
  Administered 2011-07-06: 4000 mL

## 2011-07-06 MED ORDER — HYDROCODONE-ACETAMINOPHEN 5-325 MG PO TABS: 1.0000 | ORAL_TABLET | Freq: Four times a day (QID) | ORAL | Status: DC | PRN

## 2011-07-06 MED ORDER — TAMSULOSIN HCL 0.4 MG PO CAPS
0.4000 mg | ORAL_CAPSULE | Freq: Every day | ORAL | Status: DC
  Filled 2011-07-06 (×2): qty 1

## 2011-07-06 MED ORDER — FENTANYL CITRATE 0.05 MG/ML IJ SOLN
INTRAMUSCULAR | Status: AC
  Filled 2011-07-06: qty 2

## 2011-07-06 MED ORDER — DEXAMETHASONE SODIUM PHOSPHATE 10 MG/ML IJ SOLN
INTRAMUSCULAR | Status: DC | PRN
  Administered 2011-07-06: 10 mg via INTRAVENOUS

## 2011-07-06 MED ORDER — OXYCODONE HCL 5 MG PO TABS: 5.0000 mg | ORAL_TABLET | ORAL | Status: AC | PRN

## 2011-07-06 MED ORDER — ONDANSETRON HCL 4 MG/2ML IJ SOLN
INTRAMUSCULAR | Status: DC | PRN
  Administered 2011-07-06: 4 mg via INTRAVENOUS

## 2011-07-06 MED ORDER — ADULT MULTIVITAMIN W/MINERALS CH
1.0000 | ORAL_TABLET | Freq: Every day | ORAL | Status: DC
  Filled 2011-07-06 (×2): qty 1

## 2011-07-06 MED ORDER — 0.9 % SODIUM CHLORIDE (POUR BTL) OPTIME
TOPICAL | Status: DC | PRN
  Administered 2011-07-06: 1000 mL

## 2011-07-06 MED ORDER — ACETAMINOPHEN 10 MG/ML IV SOLN
INTRAVENOUS | Status: AC
  Filled 2011-07-06: qty 100

## 2011-07-06 MED ORDER — IBUPROFEN 800 MG PO TABS: 400.0000 mg | ORAL_TABLET | Freq: Four times a day (QID) | ORAL | Status: DC | PRN

## 2011-07-06 MED ORDER — URIBEL 118 MG PO CAPS: 1.0000 | ORAL_CAPSULE | Freq: Four times a day (QID) | ORAL | Status: DC | PRN

## 2011-07-06 MED ORDER — IOHEXOL 300 MG/ML  SOLN
INTRAMUSCULAR | Status: DC | PRN
  Administered 2011-07-06: 50 mL

## 2011-07-06 MED ORDER — IOHEXOL 300 MG/ML  SOLN
INTRAMUSCULAR | Status: AC
  Filled 2011-07-06: qty 1

## 2011-07-06 MED ORDER — KETAMINE HCL 10 MG/ML IJ SOLN
INTRAMUSCULAR | Status: DC | PRN
  Administered 2011-07-06: 25 mg via INTRAVENOUS

## 2011-07-06 MED ORDER — PANTOPRAZOLE SODIUM 40 MG PO TBEC
40.0000 mg | DELAYED_RELEASE_TABLET | Freq: Every day | ORAL | Status: DC
  Filled 2011-07-06: qty 1

## 2011-07-06 MED ORDER — SODIUM CHLORIDE 0.9 % IV SOLN
INTRAVENOUS | Status: AC
  Administered 2011-07-06: 150 mL/h via INTRAVENOUS

## 2011-07-06 MED ORDER — ALFUZOSIN HCL ER 10 MG PO TB24: 10.0000 mg | ORAL_TABLET | Freq: Every day | ORAL | Status: DC

## 2011-07-06 MED ORDER — KETOROLAC TROMETHAMINE 30 MG/ML IJ SOLN
INTRAMUSCULAR | Status: AC
  Filled 2011-07-06: qty 1

## 2011-07-06 MED ORDER — ONDANSETRON HCL 4 MG/2ML IJ SOLN
4.0000 mg | Freq: Three times a day (TID) | INTRAMUSCULAR | Status: AC | PRN
  Administered 2011-07-06: 4 mg via INTRAVENOUS
  Filled 2011-07-06 (×2): qty 2

## 2011-07-06 MED ORDER — SERTRALINE HCL 100 MG PO TABS
200.0000 mg | ORAL_TABLET | Freq: Every day | ORAL | Status: DC
  Filled 2011-07-06 (×2): qty 2

## 2011-07-06 MED ORDER — SUCCINYLCHOLINE CHLORIDE 20 MG/ML IJ SOLN
INTRAMUSCULAR | Status: DC | PRN
  Administered 2011-07-06: 100 mg via INTRAVENOUS

## 2011-07-06 MED ORDER — PROPOFOL 10 MG/ML IV EMUL
INTRAVENOUS | Status: DC | PRN
  Administered 2011-07-06: 150 mg via INTRAVENOUS

## 2011-07-06 MED ORDER — HYDROMORPHONE HCL PF 1 MG/ML IJ SOLN
1.0000 mg | INTRAMUSCULAR | Status: DC | PRN
  Administered 2011-07-06: 1 mg via INTRAVENOUS
  Filled 2011-07-06: qty 1

## 2011-07-06 MED ORDER — ACETAMINOPHEN 500 MG PO TABS: 500.0000 mg | ORAL_TABLET | Freq: Four times a day (QID) | ORAL | Status: DC | PRN

## 2011-07-06 MED ORDER — LOPINAVIR-RITONAVIR 200-50 MG PO TABS
2.0000 | ORAL_TABLET | Freq: Two times a day (BID) | ORAL | Status: DC
  Filled 2011-07-06 (×4): qty 2

## 2011-07-06 MED ORDER — ACETAMINOPHEN 10 MG/ML IV SOLN
INTRAVENOUS | Status: DC | PRN
  Administered 2011-07-06: 1000 mg via INTRAVENOUS

## 2011-07-06 MED ORDER — ONDANSETRON HCL 4 MG/2ML IJ SOLN
4.0000 mg | Freq: Once | INTRAMUSCULAR | Status: AC
  Administered 2011-07-06: 4 mg via INTRAVENOUS
  Filled 2011-07-06: qty 2

## 2011-07-06 MED ORDER — FENTANYL CITRATE 0.05 MG/ML IJ SOLN
25.0000 ug | INTRAMUSCULAR | Status: DC | PRN
  Administered 2011-07-06 (×3): 50 ug via INTRAVENOUS

## 2011-07-06 MED ORDER — HYDROMORPHONE HCL 2 MG PO TABS
4.0000 mg | ORAL_TABLET | ORAL | Status: DC | PRN
  Administered 2011-07-06: 4 mg via ORAL
  Filled 2011-07-06 (×2): qty 2

## 2011-07-06 MED ORDER — HYDROMORPHONE HCL PF 1 MG/ML IJ SOLN
1.0000 mg | INTRAMUSCULAR | Status: DC
  Administered 2011-07-06 (×2): 1 mg via INTRAVENOUS
  Filled 2011-07-06 (×2): qty 1

## 2011-07-06 MED ORDER — KETOROLAC TROMETHAMINE 30 MG/ML IJ SOLN
30.0000 mg | Freq: Once | INTRAMUSCULAR | Status: AC
  Administered 2011-07-06: 30 mg via INTRAVENOUS

## 2011-07-06 MED ORDER — CIPROFLOXACIN IN D5W 400 MG/200ML IV SOLN
INTRAVENOUS | Status: AC
  Filled 2011-07-06: qty 200

## 2011-07-06 MED ORDER — FENTANYL 25 MCG/HR TD PT72: 25.0000 ug | MEDICATED_PATCH | TRANSDERMAL | Status: DC

## 2011-07-06 MED ORDER — MIDAZOLAM HCL 5 MG/5ML IJ SOLN
INTRAMUSCULAR | Status: DC | PRN
  Administered 2011-07-06: 2 mg via INTRAVENOUS

## 2011-07-06 SURGICAL SUPPLY — 17 items
BAG URO CATCHER STRL LF (DRAPE) ×3 IMPLANT
BASKET ZERO TIP NITINOL 2.4FR (BASKET) ×2 IMPLANT
BSKT STON RTRVL ZERO TP 2.4FR (BASKET) ×2
CATH URET 5FR 28IN OPEN ENDED (CATHETERS) ×3 IMPLANT
CLOTH BEACON ORANGE TIMEOUT ST (SAFETY) ×3 IMPLANT
DRAPE CAMERA CLOSED 9X96 (DRAPES) ×3 IMPLANT
GLIDEWIRE 0.025 SS STRAIGHT (WIRE) ×2 IMPLANT
GLOVE SURG SS PI 8.0 STRL IVOR (GLOVE) ×3 IMPLANT
GOWN PREVENTION PLUS XLARGE (GOWN DISPOSABLE) ×3 IMPLANT
GOWN STRL REIN XL XLG (GOWN DISPOSABLE) ×3 IMPLANT
GUIDEWIRE STR DUAL SENSOR (WIRE) ×2 IMPLANT
LASER FIBER DISP (UROLOGICAL SUPPLIES) ×2 IMPLANT
MANIFOLD NEPTUNE II (INSTRUMENTS) ×3 IMPLANT
MARKER SKIN DUAL TIP RULER LAB (MISCELLANEOUS) ×3 IMPLANT
PACK CYSTO (CUSTOM PROCEDURE TRAY) ×3 IMPLANT
STENT CONTOUR URETERAL (STENTS) ×2 IMPLANT
TUBING CONNECTING 10 (TUBING) ×3 IMPLANT

## 2011-07-06 NOTE — Progress Notes (Addendum)
Patient returned from OR. Vital signs stable.  Patient states he feels better after procedure.  Patient urinated 700 ml of pink-light red urine.  Tolerating sprite and water.

## 2011-07-06 NOTE — ED Provider Notes (Signed)
History     CSN: [id number]  Arrival date & time 07/05/11  2319   First MD Initiated Contact with Patient 07/05/11 2334      Chief Complaint  Patient presents with  . Flank Pain    L flank s/p lithotripsy 07/03/11  . Nausea    (Consider location/radiation/quality/duration/timing/severity/associated sxs/prior treatment) HPI Comments: 31 year old male with a history of left-sided large kidney stone. He presents with acute onset of left flank pain radiating to the left lower abdomen. This is colicky in nature, severe at worse, associated with nausea and vomiting and persistent. He states that approximately 2 days ago he had lithotripsy performed at this hospital and has been passing a small amount of sediment since that time. He denies any dysuria fevers. He has been taking hydromorphone at home with good pain relief. His symptoms were acute in onset this evening, nothing makes it better or worse and he did note hematuria prior to arrival  Patient is a 31 y.o. male presenting with flank pain. The history is provided by the patient.  Flank Pain This is a new problem.    Past Medical History  Diagnosis Date  . HIV (human immunodeficiency virus infection)   . Blood transfusion   . Arthritis   . Kidney calculus   . Narcotic addiction     Past Surgical History  Procedure Date  . Splenectomy   . Hip fracture surgery   . Tonsillectomy     Family History  Problem Relation Age of Onset  . Multiple sclerosis Mother   . Stroke Father     History  Substance Use Topics  . Smoking status: Current Some Day Smoker    Types: Cigarettes  . Smokeless tobacco: Not on file  . Alcohol Use: No      Review of Systems  Genitourinary: Positive for flank pain.  All other systems reviewed and are negative.    Allergies  Morphine and related; Penicillins; Sulfa antibiotics; and Capzasin  Home Medications   Current Outpatient Rx  Name Route Sig Dispense Refill  . ACETAMINOPHEN 500  MG PO TABS Oral Take 500 mg by mouth every 6 (six) hours as needed. For pain    . ALPRAZOLAM 1 MG PO TABS Oral Take 1 mg by mouth every 4 (four) hours as needed. For anxiety    . ASPIRIN-ACETAMINOPHEN 500-325 MG PO PACK Oral Take 1 Package by mouth daily as needed. PAIN    . EMTRICITABINE-TENOFOVIR 200-300 MG PO TABS Oral Take 1 tablet by mouth daily.    . FENTANYL 25 MCG/HR TD PT72 Transdermal Place 1 patch onto the skin 2 days.    [name] HYDROCODONE-ACETAMINOPHEN 5-500 MG PO TABS Oral Take 1 tablet by mouth every 6 (six) hours as needed.    [name] HYDROMORPHONE HCL 4 MG PO TABS Oral Take 4 mg by mouth every 4 (four) hours as needed. FOR PAIN    . IBUPROFEN 200 MG PO TABS Oral Take 400 mg by mouth every 6 (six) hours as needed.    [name] LOPINAVIR-RITONAVIR 200-50 MG PO TABS Oral Take 2 tablets by mouth 2 (two) times daily.    . MELOXICAM 7.5 MG PO TABS Oral Take 7.5 mg by mouth daily.    . ADULT MULTIVITAMIN W/MINERALS CH Oral Take 1 tablet by mouth daily.    [name] OMEPRAZOLE 20 MG PO CPDR Oral Take 20 mg by mouth daily.    [name] PSEUDOEPHEDRINE HCL ER 120 MG PO TB12 Oral Take 120 mg by mouth  every 12 (twelve) hours.    . SERTRALINE HCL 100 MG PO TABS Oral Take 200 mg by mouth daily.      BP 133/82  Pulse 68  Temp(Src) 97.7 F (36.5 C) (Oral)  Resp 16  Ht 5\' 8"  (1.727 m)  Wt 179 lb (81.194 kg)  BMI 27.22 kg/m2  SpO2 98%  Physical Exam  Nursing note and vitals reviewed. Constitutional: He appears well-developed and well-nourished. No distress.  HENT:  Head: Normocephalic and atraumatic.  Mouth/Throat: Oropharynx is clear and moist. No oropharyngeal exudate.  Eyes: Conjunctivae and EOM are normal. Pupils are equal, round, and reactive to light. Right eye exhibits no discharge. Left eye exhibits no discharge. No scleral icterus.  Neck: Normal range of motion. Neck supple. No JVD present. No thyromegaly present.  Cardiovascular: Normal rate, regular rhythm, normal heart sounds and intact distal pulses.   Exam reveals no gallop and no friction rub.   No murmur heard. Pulmonary/Chest: Effort normal and breath sounds normal. No respiratory distress. He has no wheezes. He has no rales.  Abdominal: Soft. Bowel sounds are normal. He exhibits no distension and no mass. There is no tenderness ( No reproducible tenderness to palpation).       CVA tenderness on the left  Musculoskeletal: Normal range of motion. He exhibits no edema and no tenderness.  Lymphadenopathy:    He has no cervical adenopathy.  Neurological: He is alert. Coordination normal.  Skin: Skin is warm and dry. No rash noted. No erythema.  Psychiatric: He has a normal mood and affect. His behavior is normal.    ED Course  Procedures (including critical care time)  Labs Reviewed  URINALYSIS, ROUTINE W REFLEX MICROSCOPIC - Abnormal; Notable for the following:    Color, Urine RED (*) BIOCHEMICALS MAY BE AFFECTED BY COLOR   APPearance CLOUDY (*)    Hgb urine dipstick LARGE (*)    Protein, ur 30 (*)    Leukocytes, UA TRACE (*)    All other components within normal limits  BASIC METABOLIC PANEL - Abnormal; Notable for the following:    Potassium 3.3 (*)    All other components within normal limits  URINE MICROSCOPIC-ADD ON - Abnormal; Notable for the following:    Crystals CA OXALATE CRYSTALS (*)    All other components within normal limits   Ct Abdomen Pelvis Wo Contrast  07/06/2011  *RADIOLOGY REPORT*  Clinical Data: Left flank pain and recent lithotripsy.  CT ABDOMEN AND PELVIS WITHOUT CONTRAST  Technique:  Multidetector CT imaging of the abdomen and pelvis was performed following the standard protocol without intravenous contrast.  Comparison: 08/10/2009  Findings: Lung bases are clear.  No evidence for free air.  There is left perinephric stranding and the patient has developed a small subcapsular hematoma.  Hematoma measures 0.8 cm in thickness and 3.5 cm in length. The left kidney is enlarged and appears edematous.  Mild  enlargement of left renal collecting system and left ureter.  They are two large stones along the left kidney lower pole.  These stones roughly measure 10 mm in size.  4 mm stone in the left kidney mid pole and 2 mm stone in the left kidney upper pole.  There are multiple stones in the left ureter.  Three stones in the upper left ureter.  Multiple stones in the distal left ureter proximal to the ureterovesical junction.  Normal appearance of the adrenal glands.  Normal appearance of the right kidney without stones or hydronephrosis.  There  appears to be at least one stone in the urinary bladder.  Normal appearance of the prostate, seminal vesicles and bowel structures.  The appendix contains high dense material but no acute inflammation.  No gross abnormality involving the liver, gallbladder and pancreas.  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  There are fixation nails extending through the left iliac bone.  No acute bony abnormality.  IMPRESSION:  The left kidney is enlarged with mild hydronephrosis.  Multiple stones throughout the left ureter.  Small subcapsular hematoma in the left kidney.  Left renal stones.  Original Report Authenticated By: Richarda Overlie, M.D.     1. Ureterolithiasis       MDM  Vital signs reflecting mild tachycardia, patient appears to have renal colic clinically and given his history of recent lithotripsy this is the most likely etiology of his pain. Urinalysis, basic metabolic panel, CT abdomen to rule out large impacted stone fragment. Intravenous hydromorphone and Zofran with fluids.    Discussed with urologist who will admit the patient to the hospital for ongoing pain and urinary obstruction  Ciprofloxacin ordered in the emergency department, basic metabolic panel reviewed and is normal, CT scan reviewed and shows multiple left-sided ureteral stones causing obstruction. Patient has passed urine since arrival, it is  dark  Vida Roller, MD 07/06/11 [phone]

## 2011-07-06 NOTE — Progress Notes (Signed)
Paged Md regarding patient home medications.  Per MD, he will address medications when able.  Patient to remain NPO with no ice chips at this time.

## 2011-07-06 NOTE — Anesthesia Postprocedure Evaluation (Signed)
  Anesthesia Post-op Note  Patient: Devin Crawford  Procedure(s) Performed: Procedure(s) (LRB): CYSTOSCOPY WITH RETROGRADE PYELOGRAM, URETEROSCOPY AND STENT PLACEMENT (Left) HOLMIUM LASER APPLICATION (Left)  Patient Location: PACU  Anesthesia Type: General  Level of Consciousness: oriented and sedated  Airway and Oxygen Therapy: Patient Spontanous Breathing and Patient connected to nasal cannula oxygen  Post-op Pain: mild  Post-op Assessment: Post-op Vital signs reviewed, Patient's Cardiovascular Status Stable, Respiratory Function Stable and Patent Airway  Post-op Vital Signs: stable  Complications: No apparent anesthesia complications

## 2011-07-06 NOTE — H&P (Signed)
H&P    History of Present Illness: 31 yo who underwent left ESWL for 16 mm LUP stone 3 days ago. Last night developed severe left flank and groin pain with nausea and emesis. Pain uncontrolled on IV meds, too. Pt with emesis this AM and has not passed any stone material since reporting to ER.   CT and KUB -  - i reviewed all images - 2 cm left steinstrasse at left UVJ with proximal hydronephrosis, 4 mm right prox frag and two large fragment in LLP   Past Medical History  Diagnosis Date  . HIV (human immunodeficiency virus infection)   . Blood transfusion   . Arthritis   . Kidney calculus   . Narcotic addiction    Past Surgical History  Procedure Date  . Splenectomy   . Hip fracture surgery   . Tonsillectomy     Home Medications:  Prescriptions prior to admission  Medication Sig Dispense Refill  . acetaminophen (TYLENOL) 500 MG tablet Take 500 mg by mouth every 6 (six) hours as needed. For pain      . ALPRAZolam (XANAX) 1 MG tablet Take 1 mg by mouth every 4 (four) hours as needed. For anxiety      . Aspirin-Acetaminophen (GOODY BODY PAIN) 500-325 MG PACK Take 1 Package by mouth daily as needed. PAIN      . emtricitabine-tenofovir (TRUVADA) 200-300 MG per tablet Take 1 tablet by mouth daily.      . fentaNYL (DURAGESIC - DOSED MCG/HR) 25 MCG/HR Place 1 patch onto the skin 2 days.      [name] HYDROcodone-acetaminophen (VICODIN) 5-500 MG per tablet Take 1 tablet by mouth every 6 (six) hours as needed.      [name] HYDROmorphone (DILAUDID) 4 MG tablet Take 4 mg by mouth every 4 (four) hours as needed. FOR PAIN      . ibuprofen (ADVIL,MOTRIN) 200 MG tablet Take 400 mg by mouth every 6 (six) hours as needed.      . lopinavir-ritonavir (KALETRA) 200-50 MG per tablet Take 2 tablets by mouth 2 (two) times daily.      . meloxicam (MOBIC) 7.5 MG tablet Take 7.5 mg by mouth daily.      . Multiple Vitamin (MULITIVITAMIN WITH MINERALS) TABS Take 1 tablet by mouth daily.      [name] omeprazole (PRILOSEC) 20 MG  capsule Take 20 mg by mouth daily.      . pseudoephedrine (SUDAFED 12 HOUR) 120 MG 12 hr tablet Take 120 mg by mouth every 12 (twelve) hours.      . sertraline (ZOLOFT) 100 MG tablet Take 200 mg by mouth daily.       Allergies:  Allergies  Allergen Reactions  . Morphine And Related     Hallucinations   . Penicillins     edema  . Sulfa Antibiotics Hives  . Capzasin Rash    Family History  Problem Relation Age of Onset  . Multiple sclerosis Mother   . Stroke Father    Social History:  reports that he has been smoking Cigarettes.  He does not have any smokeless tobacco history on file. He reports that he uses illicit drugs. He reports that he does not drink alcohol.  ROS: A complete review of systems was performed.  All systems are negative except for pertinent findings as noted. @ROS @  Physical Exam:  Vital signs in last 24 hours: Temp:  [97.1 F (36.2 C)-97.7 F (36.5 C)] 97.1 F (36.2 C) (02/17 0700) Pulse Rate:  [  62-107] 91  (02/17 0700) Resp:  [14-20] 18  (02/17 0700) BP: (133-143)/(73-98) 143/98 mmHg (02/17 0700) SpO2:  [98 %-100 %] 98 % (02/17 0700) Weight:  [81.194 kg (179 lb)] 81.194 kg (179 lb) (02/16 2330) General:  Alert and oriented, No acute distress HEENT: Normocephalic, atraumatic Neck: No JVD or lymphadenopathy Cardiovascular: Regular rate and rhythm Lungs: normal effort, rate Abdomen: Soft, nontender, nondistended, no abdominal masses Back: No CVA tenderness Extremities: No edema Neurologic: Grossly intact  Laboratory Data:  Results for orders placed during the hospital encounter of 07/05/11 (from the past 24 hour(s))  BASIC METABOLIC PANEL     Status: Abnormal   Collection Time   07/05/11 11:35 PM      Component Value Range   Sodium 136  135 - 145 (mEq/L)   Potassium 3.3 (*) 3.5 - 5.1 (mEq/L)   Chloride 101  96 - 112 (mEq/L)   CO2 26  19 - 32 (mEq/L)   Glucose, Bld 91  70 - 99 (mg/dL)   BUN 10  6 - 23 (mg/dL)   Creatinine, Ser 1.61  0.50 -  1.35 (mg/dL)   Calcium 9.3  8.4 - 09.6 (mg/dL)   GFR calc non Af Amer >90  >90 (mL/min)   GFR calc Af Amer >90  >90 (mL/min)  URINALYSIS, ROUTINE W REFLEX MICROSCOPIC     Status: Abnormal   Collection Time   07/06/11  1:44 AM      Component Value Range   Color, Urine RED (*) YELLOW    APPearance CLOUDY (*) CLEAR    Specific Gravity, Urine 1.013  1.005 - 1.030    pH 6.5  5.0 - 8.0    Glucose, UA NEGATIVE  NEGATIVE (mg/dL)   Hgb urine dipstick LARGE (*) NEGATIVE    Bilirubin Urine NEGATIVE  NEGATIVE    Ketones, ur NEGATIVE  NEGATIVE (mg/dL)   Protein, ur 30 (*) NEGATIVE (mg/dL)   Urobilinogen, UA 0.2  0.0 - 1.0 (mg/dL)   Nitrite NEGATIVE  NEGATIVE    Leukocytes, UA TRACE (*) NEGATIVE   URINE MICROSCOPIC-ADD ON     Status: Abnormal   Collection Time   07/06/11  1:44 AM      Component Value Range   Squamous Epithelial / LPF RARE  RARE    WBC, UA 0-2  <3 (WBC/hpf)   RBC / HPF TOO NUMEROUS TO COUNT  <3 (RBC/hpf)   Bacteria, UA RARE  RARE    Crystals CA OXALATE CRYSTALS (*) NEGATIVE    Urine-Other MUCOUS PRESENT     No results found for this or any previous visit (from the past 240 hour(s)). Creatinine:  Basename 07/05/11 2335  CREATININE 1.07   CT and KUB  - i reviewed all images - 2 cm left steinstrasse at left UVJ, 4 mm right prox frag and two large fragment in LLP Impression/Assessment:  Left ureteral stones Left renal stone Hydronephrosis Chronic pain Nausea and emesis  Plan:  -I discussed with the patient the CT and KUB findings. We discussed the nature, potential benefits, risks and alternatives to cystoscopy possible left ureteroscopy with laser lithotripsy and possible ureteral stent, including side effects of the proposed treatment, the likelihood of the patient achieving the goals of the procedure, and any potential problems that might occur during the procedure or recuperation. We discussed we may only be able to leave a stent, not get stones in ureter or kidney and  he may need staged/multiple procedure. We discussed ureteral injury and stent pain  among others. We discussed non-surgical treatment and continued stone passage with MET. All questions answered. Patient elects to proceed with cystoscopy possible left ureteroscopy with laser lithotripsy and possible ureteral stent.  -Discussed plan D/C home post-op   Antony Haste 07/06/2011, 11:06 AM

## 2011-07-06 NOTE — Progress Notes (Signed)
Patient had episode of vomitus.  Gave Zofran and will check back with patient.

## 2011-07-06 NOTE — Brief Op Note (Signed)
07/05/2011 - 07/06/2011  3:31 PM  PATIENT:  Devin Crawford  31 y.o. male  PRE-OPERATIVE DIAGNOSIS:  left ureteral stone, left renal stones, hydronephrosis, N/V  POST-OPERATIVE DIAGNOSIS: left ureteral stone, left renal stones, hydronephrosis, N/V  PROCEDURE:  Procedure(s) (LRB): CYSTOSCOPY WITH LEFT Ureteroscopy, STENT PLACEMENT, HOLMIUM LASER APPLICATION/Lithotripsy (Left)  SURGEON:  Surgeon(s) and Role:    * Antony Haste, MD - Primary  ANESTHESIA:   spinal and general  EBL:  Total I/O In: 1015 [I.V.:1015] Out: 400 [Urine:400]    DRAINS: 6 x 26 cm left ureteral stent  LOCAL MEDICATIONS USED:  NONE  SPECIMEN:  No Specimen  DISPOSITION OF SPECIMEN:  N/A  COUNTS:  YES  TOURNIQUET:  * No tourniquets in log *  DICTATION: [id number]  PLAN OF CARE: Discharge to home after PACU  PATIENT DISPOSITION:  PACU - hemodynamically stable.   Delay start of Pharmacological VTE agent (>24hrs) due to surgical blood loss or risk of bleeding: no

## 2011-07-06 NOTE — Anesthesia Preprocedure Evaluation (Addendum)
Anesthesia Evaluation  Patient identified by MRN, date of birth, ID band Patient awake  General Assessment Comment:Infection precautions NPO today  Reviewed: Allergy & Precautions, H&P , NPO status , Patient's Chart, lab work & pertinent test results, reviewed documented beta blocker date and time   Airway Mallampati: II TM Distance: >3 FB Neck ROM: Full    Dental  (+) Teeth Intact   Pulmonary neg pulmonary ROS,  clear to auscultation        Cardiovascular neg cardio ROS Regular Normal Denies cardiac symptoms   Neuro/Psych Negative Neurological ROS  Negative Psych ROS   GI/Hepatic negative GI ROS, Neg liver ROS,   Endo/Other  Negative Endocrine ROS  Renal/GU Kidney stone  Genitourinary negative   Musculoskeletal negative musculoskeletal ROS (+)   Abdominal   Peds negative pediatric ROS (+)  Hematology + HIV   Anesthesia Other Findings   Reproductive/Obstetrics negative OB ROS                           Anesthesia Physical Anesthesia Plan  ASA: III and Emergent  Anesthesia Plan: General   Post-op Pain Management:    Induction: Intravenous, Rapid sequence and Cricoid pressure planned  Airway Management Planned: Oral ETT  Additional Equipment:   Intra-op Plan:   Post-operative Plan: Extubation in OR  Informed Consent: I have reviewed the patients History and Physical, chart, labs and discussed the procedure including the risks, benefits and alternatives for the proposed anesthesia with the patient or authorized representative who has indicated his/her understanding and acceptance.   Dental advisory given  Plan Discussed with: CRNA and Surgeon  Anesthesia Plan Comments:        Anesthesia Quick Evaluation

## 2011-07-06 NOTE — Progress Notes (Signed)
Patient had second episode of vomiting, yellow and liquid.  Patient unable to take morning medications due to nausea.

## 2011-07-06 NOTE — Anesthesia Postprocedure Evaluation (Signed)
  Anesthesia Post-op Note  Patient: Devin Crawford  Procedure(s) Performed: Procedure(s) (LRB): CYSTOSCOPY WITH RETROGRADE PYELOGRAM, URETEROSCOPY AND STENT PLACEMENT (Left) HOLMIUM LASER APPLICATION (Left)  Patient Location: PACU  Anesthesia Type: General  Level of Consciousness: oriented and sedated  Airway and Oxygen Therapy: Patient Spontanous Breathing  Post-op Pain: mild  Post-op Assessment: Post-op Vital signs reviewed, Patient's Cardiovascular Status Stable, Respiratory Function Stable and Patent Airway  Post-op Vital Signs: stable  Complications: No apparent anesthesia complications

## 2011-07-06 NOTE — Transfer of Care (Signed)
Immediate Anesthesia Transfer of Care Note  Patient: Devin Crawford  Procedure(s) Performed: Procedure(s) (LRB): CYSTOSCOPY WITH RETROGRADE PYELOGRAM, URETEROSCOPY AND STENT PLACEMENT (Left) HOLMIUM LASER APPLICATION (Left)  Patient Location: PACU  Anesthesia Type: General  Level of Consciousness: sedated, patient cooperative and responds to stimulaton  Airway & Oxygen Therapy: Patient Spontanous Breathing and Patient connected to face mask oxgen  Post-op Assessment: Report given to PACU RN and Post -op Vital signs reviewed and stable  Post vital signs: Reviewed and stable  Complications: No apparent anesthesia complications

## 2011-07-06 NOTE — Progress Notes (Signed)
Patient refused 1000, 1200, PO medications due to nausea and vomiting.

## 2011-07-07 LAB — URINE CULTURE: Culture: NO GROWTH

## 2011-07-07 NOTE — Discharge Summary (Signed)
Physician Discharge Summary  Patient ID: Devin Crawford MRN: [id number] DOB/AGE: 21-Mar-1981 31 y.o.  Admit date: 07/05/2011 Discharge date: 07/07/2011  Admission Diagnoses: Left ureteral stones, left renal stones, hydronephrosis left, nausea vomiting  Discharge Diagnoses: Left ureteral stones, left renal stones, hydronephrosis left, nausea vomiting   Discharged Condition: good  Hospital Course: Patient was admitted for IV pain control and IV fluids for emesis. CT and KUB revealed 2 cm of left ureteral stones at the left UVJ as well as a proximal stone fragment and several fragments in the kidney with hydronephrosis. Patient is postop day #3 from left extracorporeal shockwave lithotripsy. He was treated with left ureteroscopy, laser lithotripsy, stone extraction and stent placement. He was discharged to home after this procedure on hospital day one.  Consults: None  Significant Diagnostic Studies: radiology: CT scan and KUB as above.  Treatments: Left ureteroscopy, laser lithotripsy, stone extraction and stent placemen  Discharge Exam: Blood pressure 119/79, pulse 86, temperature 97.2 F (36.2 C), temperature source Oral, resp. rate 16, height 5\' 8"  (1.727 m), weight 81.194 kg (179 lb), SpO2 95.00%. General appearance: alert, NAD  Disposition: Home or Self Care   Medication List  As of 07/07/2011 10:09 AM   STOP taking these medications         HYDROcodone-acetaminophen 5-500 MG per tablet      HYDROmorphone 4 MG tablet         TAKE these medications         acetaminophen 500 MG tablet   Commonly known as: TYLENOL   Take 500 mg by mouth every 6 (six) hours as needed. For pain      alfuzosin 10 MG 24 hr tablet   Commonly known as: UROXATRAL   Take 1 tablet (10 mg total) by mouth daily.      ALPRAZolam 1 MG tablet   Commonly known as: XANAX   Take 1 mg by mouth every 4 (four) hours as needed. For anxiety      ciprofloxacin 500 MG tablet   Commonly known as: CIPRO   Take 1 tablet (500 mg total) by mouth 2 (two) times daily.      emtricitabine-tenofovir 200-300 MG per tablet   Commonly known as: TRUVADA   Take 1 tablet by mouth daily.      fentaNYL 25 MCG/HR   Commonly known as: DURAGESIC - dosed mcg/hr   Place 1 patch onto the skin 2 days.      GOODY BODY PAIN 500-325 MG Pack   Generic drug: Aspirin-Acetaminophen   Take 1 Package by mouth daily as needed. PAIN      ibuprofen 200 MG tablet   Commonly known as: ADVIL,MOTRIN   Take 400 mg by mouth every 6 (six) hours as needed.      lopinavir-ritonavir 200-50 MG per tablet   Commonly known as: KALETRA   Take 2 tablets by mouth 2 (two) times daily.      meloxicam 7.5 MG tablet   Commonly known as: MOBIC   Take 7.5 mg by mouth daily.      mulitivitamin with minerals Tabs   Take 1 tablet by mouth daily.      omeprazole 20 MG capsule   Commonly known as: PRILOSEC   Take 20 mg by mouth daily.      oxyCODONE 5 MG immediate release tablet   Commonly known as: Oxy IR/ROXICODONE   Take 1-2 tablets (5-10 mg total) by mouth every 4 (four) hours as needed for pain.  sertraline 100 MG tablet   Commonly known as: ZOLOFT   Take 200 mg by mouth daily.      SUDAFED 12 HOUR 120 MG 12 hr tablet   Generic drug: pseudoephedrine   Take 120 mg by mouth every 12 (twelve) hours.      URIBEL 118 MG Caps   Take 1 capsule (118 mg total) by mouth 4 (four) times daily as needed.           Follow-up Information    Follow up with NESI,MARC-HENRY, MD. (1-2 WEEKS)    Contact information:   [address] [place], [address] [facility] [place] [address] [phone]          Signed: Antony Haste 07/07/2011, 10:09 AM

## 2011-07-07 NOTE — Op Note (Signed)
NAMECLEO, VILLAMIZAR NO.:  [id number]  MEDICAL RECORD NO.:  [id number]  LOCATION:  1421                         FACILITY:  [facility]  PHYSICIAN:  Jerilee Field, MD   DATE OF BIRTH:  07/09/1980  DATE OF PROCEDURE: DATE OF DISCHARGE:  07/06/2011                              OPERATIVE REPORT   PREOPERATIVE DIAGNOSES: 1. Left ureteral stone. 2. Left renal stones. 3. Hydronephrosis. 4. Nausea and vomiting.  POSTOPERATIVE DIAGNOSES: 1. Left ureteral stone. 2. Left renal stones. 3. Hydronephrosis. 4. Nausea and vomiting.  PROCEDURE:  Cystoscopy with left ureteroscopy, laser lithotripsy, stone basket extraction, and ureteral stent placement.  SURGEONS:  Jerilee Field, MD  ANESTHESIA:  General.  INDICATION FOR PROCEDURE:  Mr. Devin Crawford is a 31 year old, who underwent extracorporeal shock wave lithotripsy with 13-16 mm left upper pole stone, approximately 3 days ago.  He developed severe left flank pain yesterday with nausea and vomiting that was unrelenting today.  CT scan revealed steinstrasse stones stacked up in the left distal ureter at the ureterovesical junction for 2 cm as well as a 4 mm proximal ureteral stone and 2 large stone fragments in the left lower pole.  Despite admission through the ER and on the floor with IV Dilaudid, the patient continued to have uncontrolled pain, nausea, and vomiting.  We discussed the nature, risks, benefits and alternatives to cystoscopy with left ureteroscopy, laser lithotripsy, and stent placement.  We discussed nontreatment, continued stone passage with medical expulsion therapy. All questions were answered and he elected to proceed with ureteroscopy and stone extraction.  We discussed he had quite a bit of stone burden and he may need multiple procedures to clear all the stones ureteroscopically.  FINDINGS:  On fluoroscopy, the stones were visualized, still stacked up at the left UVJ.  They were quite impacted and  it finally required an angled glide wire to negotiate the wire into the ureter with a backing of the 6-French ureteral catheter.  Stones were lasered in the distal ureter and up in the renal pelvis, upper pole, and lower pole.  As best as could be visualized, all stone fragments were tiny and clinically insignificant, although there was some bleeding from the prior shock wave, visualization was fair in the kidney.  Once the access sheath was backed out, the entire ureter was carefully inspected, noted to be injury free and stone free.  There are quite a bit of small fragments in the kidney and he will follow up with a KUB.  If no stone fragments are seen on the KUB, it would be reasonable to remove the stent.  The stone fragments are visualized.  He may need a second look for repeat shock wave.  DESCRIPTION OF PROCEDURE:  After consent was obtained, the patient was brought to the operating room, time-out was performed to confirm the patient and procedure.  After adequate anesthesia, he was placed in the lithotomy position, prepped and draped in the usual fashion.  Rigid cystoscope was passed per urethra.  The left ureteral orifice was visualized.  There were no stones seen in the bladder.  The stones were visible on scout imaging with the fluoro at the left UVJ.  A  Sensor wire would not advance past the stones even with the aid of a 6-French open- ended catheter, therefore, an angle tip Glidewire was advanced and finally was able to be negotiated into the ureter and up into the upper pole of the left kidney.  The bladder was then drained and the scope removed.  The rigid ureteral scope was advanced adjacent to the wire without difficulty, and I encountered a large likely 6 mm stone in the distal ureter.  This was lasered with a 200 micron laser fiber into tiny pieces.  There were multiple other small stone fragments here.  Using the Nitinol basket, I was able to clear the distal ureter  and the ureter all the way up to the iliacs.  Second wire was then advanced through the ureteroscope into the kidney.  The ureteroscope was removed.  The ureteral access sheath was advanced over the second wire up to the iliacs without difficulty.  The cannula and wire were removed and the digital ureteroscope were advanced.  The remainder of the mid and proximal ureter was stone free.  There was a large fragments in the renal pelvis, and this was lasered to small pieces.  There was a fragment noted in the upper pole that was lasered and a large fragment in the lower pole that was lasered.  Inspection of the upper, middle, and lower poles did not reveal any significant stone fragments remaining, although there were a quite a bit of fragments scattered throughout.  The ureteral access sheath was backed out on the ureteroscope and the ureter was carefully inspected, noted to be normal all the way to the bladder.  The wire was then backloaded on the cystoscope and a 6 x 26 cm stent was placed.  The wire was removed.  A good coil was seen in the kidney and a good coil in the bladder.  The patient was awakened and taken to recovery room in stable condition.  COMPLICATIONS:  None.  BLOOD LOSS:  None.  DISPOSITION:  The patient was stable to PACU and we will plan to discharge him home once he awakens fully from his anesthesia.          ______________________________ Jerilee Field, MD     ME/MEDQ  D:  07/06/2011  T:  07/07/2011  Job:  [id number]

## 2011-08-08 ENCOUNTER — Emergency Department (HOSPITAL_COMMUNITY): Payer: Self-pay

## 2011-08-08 ENCOUNTER — Emergency Department (HOSPITAL_COMMUNITY)
Admission: EM | Admit: 2011-08-08 | Discharge: 2011-08-08 | Disposition: A | Discharge status: Home / Self Care | Payer: Self-pay | Attending: Emergency Medicine | Admitting: Emergency Medicine

## 2011-08-08 ENCOUNTER — Encounter (HOSPITAL_COMMUNITY): Payer: Self-pay | Admitting: *Deleted

## 2011-08-08 DIAGNOSIS — M7989 Other specified soft tissue disorders: Secondary | ICD-10-CM | POA: Insufficient documentation

## 2011-08-08 DIAGNOSIS — F17200 Nicotine dependence, unspecified, uncomplicated: Secondary | ICD-10-CM | POA: Insufficient documentation

## 2011-08-08 DIAGNOSIS — Z79899 Other long term (current) drug therapy: Secondary | ICD-10-CM | POA: Insufficient documentation

## 2011-08-08 DIAGNOSIS — Z21 Asymptomatic human immunodeficiency virus [HIV] infection status: Secondary | ICD-10-CM | POA: Insufficient documentation

## 2011-08-08 DIAGNOSIS — M129 Arthropathy, unspecified: Secondary | ICD-10-CM | POA: Insufficient documentation

## 2011-08-08 DIAGNOSIS — Z87442 Personal history of urinary calculi: Secondary | ICD-10-CM | POA: Insufficient documentation

## 2011-08-08 DIAGNOSIS — R609 Edema, unspecified: Secondary | ICD-10-CM | POA: Insufficient documentation

## 2011-08-08 DIAGNOSIS — R0602 Shortness of breath: Secondary | ICD-10-CM | POA: Insufficient documentation

## 2011-08-08 DIAGNOSIS — B20 Human immunodeficiency virus [HIV] disease: Secondary | ICD-10-CM

## 2011-08-08 LAB — HEPATIC FUNCTION PANEL
ALT: 23 U/L (ref 0–53)
AST: 16 U/L (ref 0–37)
Albumin: 3.5 g/dL (ref 3.5–5.2)
Alkaline Phosphatase: 52 U/L (ref 39–117)
Bilirubin, Direct: 0.1 mg/dL (ref 0.0–0.3)
Indirect Bilirubin: 0.4 mg/dL (ref 0.3–0.9)
Total Bilirubin: 0.5 mg/dL (ref 0.3–1.2)
Total Protein: 6.2 g/dL (ref 6.0–8.3)

## 2011-08-08 LAB — BASIC METABOLIC PANEL WITH GFR
BUN: 20 mg/dL (ref 6–23)
CO2: 22 meq/L (ref 19–32)
Calcium: 8.8 mg/dL (ref 8.4–10.5)
Chloride: 106 meq/L (ref 96–112)
Creatinine, Ser: 0.95 mg/dL (ref 0.50–1.35)
GFR calc Af Amer: >90 mL/min
GFR calc non Af Amer: >90 mL/min
Glucose, Bld: 114 mg/dL — ABNORMAL HIGH (ref 70–99)
Potassium: 3.7 meq/L (ref 3.5–5.1)
Sodium: 140 meq/L (ref 135–145)

## 2011-08-08 LAB — CBC
HCT: 39.4 % (ref 39.0–52.0)
Hemoglobin: 13.6 g/dL (ref 13.0–17.0)
MCH: 34.2 pg — ABNORMAL HIGH (ref 26.0–34.0)
MCHC: 34.5 g/dL (ref 30.0–36.0)
MCV: 99 fL (ref 78.0–100.0)
Platelets: 244 10*3/uL (ref 150–400)
RBC: 3.98 MIL/uL — ABNORMAL LOW (ref 4.22–5.81)
RDW: 14.9 % (ref 11.5–15.5)
WBC: 15.4 10*3/uL — ABNORMAL HIGH (ref 4.0–10.5)

## 2011-08-08 LAB — DIFFERENTIAL
Basophils Absolute: 0 10*3/uL (ref 0.0–0.1)
Eosinophils Relative: 0 % (ref 0–5)
Lymphocytes Relative: 13 % (ref 12–46)
Monocytes Absolute: 1 10*3/uL (ref 0.1–1.0)

## 2011-08-08 MED ORDER — HYDROCHLOROTHIAZIDE 25 MG PO TABS: 25.0000 mg | ORAL_TABLET | Freq: Every day | ORAL | Status: AC

## 2011-08-08 MED ORDER — FLUCONAZOLE 200 MG PO TABS: 100.0000 mg | ORAL_TABLET | Freq: Every day | ORAL | Status: AC

## 2011-08-08 NOTE — ED Notes (Signed)
To note, CD4 count was 600 prior to steroid injection and last reading, 1 week ago is 131. ID MD, Jason Nest with [facility].

## 2011-08-08 NOTE — ED Notes (Signed)
States CD4 count dropped past steriod injection.

## 2011-08-08 NOTE — ED Notes (Signed)
To ED for eval of sob that started suddenly 30 min pta. Pt states he received a steriod inj at the beginning of the month. ID doc thinks pt is having an adverse rx. States swelling in his lower extremities started a week ago. Denies fever. Denies cough.

## 2011-08-08 NOTE — ED Notes (Signed)
Patient left without signing discharge. Unsure if pt received discharge paperwork. Per Dr. Deretha Emory, pt has Rx.

## 2011-08-08 NOTE — Discharge Instructions (Signed)
Workup in the emergency department negative for the shortness of breath no evidence of fluid on the lungs no wheezing no asthma no significant throat tongue or lip swelling. Followup with your infectious disease doctors at [facility] call the on-call service if necessary. I know your regular doctor with plan to give you a diuretics were not sure which direction they were going in and don't get too many cooks in the kitchen. History of thrush very mild white areas in the throat not extensive. Will deferto [facility] infectious disease doctors about restarting nystatin.

## 2011-08-09 NOTE — ED Provider Notes (Signed)
History     CSN: [id number]  Arrival date & time 08/08/11  1743   First MD Initiated Contact with Patient 08/08/11 1755      Chief Complaint  Patient presents with  . Shortness of Breath    (Consider location/radiation/quality/duration/timing/severity/associated sxs/prior treatment) Patient is a 31 y.o. male presenting with shortness of breath. The history is provided by the patient.  Shortness of Breath  The current episode started today. The onset was sudden. The problem has been rapidly improving. The problem is moderate. The symptoms are relieved by nothing. The symptoms are aggravated by nothing. Associated symptoms include shortness of breath. Pertinent negatives include no chest pain, no chest pressure, no fever, no sore throat, no stridor, no cough and no wheezing.   Hx of HIV followed at [facility]. His ID doc states that the bilat leg swelling for one week is realted to steroid injection form one month ago. No fever no cough. No rash no oral swelling. No cp. SOB improving.   Past Medical History  Diagnosis Date  . HIV (human immunodeficiency virus infection)   . Blood transfusion   . Arthritis   . Kidney calculus   . Narcotic addiction     Past Surgical History  Procedure Date  . Splenectomy   . Hip fracture surgery   . Tonsillectomy     Family History  Problem Relation Age of Onset  . Multiple sclerosis Mother   . Stroke Father     History  Substance Use Topics  . Smoking status: Current Some Day Smoker    Types: Cigarettes  . Smokeless tobacco: Not on file  . Alcohol Use: No      Review of Systems  Constitutional: Negative for fever.  HENT: Negative for congestion, sore throat, trouble swallowing, neck pain and neck stiffness.   Eyes: Negative for redness.  Respiratory: Positive for shortness of breath. Negative for cough, wheezing and stridor.   Cardiovascular: Positive for leg swelling. Negative for chest pain and palpitations.  Gastrointestinal:  Negative for nausea, vomiting and abdominal pain.  Genitourinary: Negative for dysuria and hematuria.  Musculoskeletal: Negative for back pain.  Skin: Negative for rash.  Neurological: Negative for light-headedness and headaches.  Hematological: Does not bruise/bleed easily.    Allergies  Morphine and related; Penicillins; Sulfa antibiotics; and Capzasin  Home Medications   Current Outpatient Rx  Name Route Sig Dispense Refill  . ACETAMINOPHEN 500 MG PO TABS Oral Take 500 mg by mouth every 6 (six) hours as needed. For pain    . ALPRAZOLAM 1 MG PO TABS Oral Take 1 mg by mouth every 4 (four) hours as needed. For anxiety    . ATOVAQUONE 750 MG/5ML PO SUSP Oral Take 1,500 mg by mouth daily.    [name] EMTRICITABINE-TENOFOVIR 200-300 MG PO TABS Oral Take 1 tablet by mouth daily.    . FENTANYL 25 MCG/HR TD PT72 Transdermal Place 1 patch onto the skin 2 days.    . IBUPROFEN 200 MG PO TABS Oral Take 400 mg by mouth every 6 (six) hours as needed. For pain.    [name] LOPINAVIR-RITONAVIR 200-50 MG PO TABS Oral Take 2 tablets by mouth 2 (two) times daily.    . MELOXICAM 7.5 MG PO TABS Oral Take 7.5 mg by mouth daily.    . ADULT MULTIVITAMIN W/MINERALS CH Oral Take 1 tablet by mouth daily.    [name] OMEPRAZOLE 20 MG PO CPDR Oral Take 20 mg by mouth daily.    . OXYCODONE HCL  5 MG PO TABS Oral Take 5 mg by mouth every 4 (four) hours as needed. For pain.    [name] SERTRALINE HCL 100 MG PO TABS Oral Take 200 mg by mouth daily.    [name] FLUCONAZOLE 200 MG PO TABS Oral Take 0.5 tablets (100 mg total) by mouth daily. 7 tablet 0  . HYDROCHLOROTHIAZIDE 25 MG PO TABS Oral Take 1 tablet (25 mg total) by mouth daily. 7 tablet 0    BP 146/92  Pulse 105  Temp(Src) 97.7 F (36.5 C) (Oral)  Resp 20  SpO2 98%  Physical Exam  Nursing note and vitals reviewed. Constitutional: He is oriented to person, place, and time. He appears well-developed and well-nourished. No distress.  HENT:  Head: Normocephalic and atraumatic.    Mouth/Throat: Oropharynx is clear and moist. No oropharyngeal exudate.  Eyes: Conjunctivae and EOM are normal. Pupils are equal, round, and reactive to light.  Neck: Normal range of motion. Neck supple.  Cardiovascular: Normal rate, regular rhythm, normal heart sounds and intact distal pulses.   No murmur heard. Pulmonary/Chest: Effort normal and breath sounds normal. No respiratory distress. He has no wheezes. He has no rales. He exhibits no tenderness.  Abdominal: Soft. Bowel sounds are normal. There is no tenderness.  Musculoskeletal: Normal range of motion. He exhibits edema. He exhibits no tenderness.       bilat swelling  Lymphadenopathy:    He has no cervical adenopathy.  Neurological: He is alert and oriented to person, place, and time. No cranial nerve deficit. He exhibits normal muscle tone. Coordination normal.  Skin: Skin is warm. No rash noted. He is not diaphoretic.    ED Course  Procedures (including critical care time)  Labs Reviewed  BASIC METABOLIC PANEL - Abnormal; Notable for the following:    Glucose, Bld 114 (*)    All other components within normal limits  CBC - Abnormal; Notable for the following:    WBC 15.4 (*)    RBC 3.98 (*)    MCH 34.2 (*)    All other components within normal limits  DIFFERENTIAL - Abnormal; Notable for the following:    Neutrophils Relative 81 (*)    Neutro Abs 12.4 (*)    All other components within normal limits  HEPATIC FUNCTION PANEL  LAB REPORT - SCANNED   Dg Chest 2 View  08/08/2011  *RADIOLOGY REPORT*  Clinical Data: Shortness of breath.  CHEST - 2 VIEW  Comparison: PA and lateral chest 06/04/2011.  Findings: Lungs are clear.  Heart size is normal.  No pneumothorax or effusion.  Convex right thoracic scoliosis noted.  IMPRESSION: No acute finding.  Original Report Authenticated By: Bernadene Bell. Maricela Curet, M.D.   Results for orders placed during the hospital encounter of 08/08/11  BASIC METABOLIC PANEL      Component Value  Range   Sodium 140  135 - 145 (mEq/L)   Potassium 3.7  3.5 - 5.1 (mEq/L)   Chloride 106  96 - 112 (mEq/L)   CO2 22  19 - 32 (mEq/L)   Glucose, Bld 114 (*) 70 - 99 (mg/dL)   BUN 20  6 - 23 (mg/dL)   Creatinine, Ser 4.09  0.50 - 1.35 (mg/dL)   Calcium 8.8  8.4 - 81.1 (mg/dL)   GFR calc non Af Amer >90  >90 (mL/min)   GFR calc Af Amer >90  >90 (mL/min)  HEPATIC FUNCTION PANEL      Component Value Range   Total Protein 6.2  6.0 -  8.3 (g/dL)   Albumin 3.5  3.5 - 5.2 (g/dL)   AST 16  0 - 37 (U/L)   ALT 23  0 - 53 (U/L)   Alkaline Phosphatase 52  39 - 117 (U/L)   Total Bilirubin 0.5  0.3 - 1.2 (mg/dL)   Bilirubin, Direct 0.1  0.0 - 0.3 (mg/dL)   Indirect Bilirubin 0.4  0.3 - 0.9 (mg/dL)  CBC      Component Value Range   WBC 15.4 (*) 4.0 - 10.5 (K/uL)   RBC 3.98 (*) 4.22 - 5.81 (MIL/uL)   Hemoglobin 13.6  13.0 - 17.0 (g/dL)   HCT 04.5  40.9 - 81.1 (%)   MCV 99.0  78.0 - 100.0 (fL)   MCH 34.2 (*) 26.0 - 34.0 (pg)   MCHC 34.5  30.0 - 36.0 (g/dL)   RDW 91.4  78.2 - 95.6 (%)   Platelets 244  150 - 400 (K/uL)  DIFFERENTIAL      Component Value Range   Neutrophils Relative 81 (*) 43 - 77 (%)   Neutro Abs 12.4 (*) 1.7 - 7.7 (K/uL)   Lymphocytes Relative 13  12 - 46 (%)   Lymphs Abs 2.0  0.7 - 4.0 (K/uL)   Monocytes Relative 6  3 - 12 (%)   Monocytes Absolute 1.0  0.1 - 1.0 (K/uL)   Eosinophils Relative 0  0 - 5 (%)   Eosinophils Absolute 0.0  0.0 - 0.7 (K/uL)   Basophils Relative 0  0 - 1 (%)   Basophils Absolute 0.0  0.0 - 0.1 (K/uL)  '  1. Shortness of breath   2. HIV (human immunodeficiency virus infection)       MDM   Patient with hx of HIV on antivirals, followed at [facility] by ID. Recent drop in CD4 count from 600 to 100, known to [facility]. Presents with SOB feeling and bilateral leg swelling. The swelling felt to related to fecent steroid treatment. SOB new and more related to full feeling in throat. CXR negative for infection and fluid. No renal failure. No tongue or lip  swelling not stridor. Oxygen sats normal and no sig tachypnea. Do not believe PE related. Patient in no resp distress or compromise. In fact SOB resolved in ED. Patient admitted it may be anxiety related. Also hx of thrush and wants treatment for that just finished diflucan and feels it is coming back, no sig oral thrush noted but will treat. He states ID was planning on starting him on a diuretic today but he came he instead. Will give short course of HCTZ until he can see ID on Monday.         Shelda Jakes, MD 08/09/11 1248

## 2011-09-30 ENCOUNTER — Ambulatory Visit: Payer: 59

## 2011-09-30 ENCOUNTER — Telehealth: Payer: Self-pay

## 2011-09-30 NOTE — Telephone Encounter (Signed)
Patient had appt today for [facility] and [facility] - he tranferred from [facility], but has not done intake with Tomasita Morrow - called patient and left message for her to call Tammy K for appt - also, talked to Tammy at [facility] as well and let her know.

## 2012-01-16 ENCOUNTER — Telehealth: Payer: Self-pay

## 2012-01-16 NOTE — Telephone Encounter (Signed)
Pt calling to schedule intake.  I have his record from [facility]. Initial attempt to reach patient  09-08-11. Patient states his ADAP has been approved.   Laurell Josephs, RN

## 2012-01-29 ENCOUNTER — Ambulatory Visit (INDEPENDENT_AMBULATORY_CARE_PROVIDER_SITE_OTHER): Payer: Self-pay

## 2012-01-29 ENCOUNTER — Ambulatory Visit: Payer: 59

## 2012-01-29 DIAGNOSIS — Z113 Encounter for screening for infections with a predominantly sexual mode of transmission: Secondary | ICD-10-CM

## 2012-01-29 DIAGNOSIS — B20 Human immunodeficiency virus [HIV] disease: Secondary | ICD-10-CM

## 2012-01-29 DIAGNOSIS — G2581 Restless legs syndrome: Secondary | ICD-10-CM

## 2012-01-29 DIAGNOSIS — F419 Anxiety disorder, unspecified: Secondary | ICD-10-CM

## 2012-01-29 DIAGNOSIS — F329 Major depressive disorder, single episode, unspecified: Secondary | ICD-10-CM

## 2012-01-29 LAB — CBC WITH DIFFERENTIAL/PLATELET
Eosinophils Absolute: 0.1 10*3/uL (ref 0.0–0.7)
HCT: 42 % (ref 39.0–52.0)
Hemoglobin: 14.7 g/dL (ref 13.0–17.0)
Lymphs Abs: 4.4 10*3/uL — ABNORMAL HIGH (ref 0.7–4.0)
MCH: 33.6 pg (ref 26.0–34.0)
MCV: 96.1 fL (ref 78.0–100.0)
Monocytes Absolute: 0.6 10*3/uL (ref 0.1–1.0)
Monocytes Relative: 7 % (ref 3–12)
Neutrophils Relative %: 41 % — ABNORMAL LOW (ref 43–77)
RBC: 4.37 MIL/uL (ref 4.22–5.81)

## 2012-01-30 LAB — COMPLETE METABOLIC PANEL WITH GFR
Albumin: 4.4 g/dL (ref 3.5–5.2)
CO2: 27 mEq/L (ref 19–32)
GFR, Est African American: >89 mL/min
GFR, Est Non African American: >89 mL/min
Glucose, Bld: 82 mg/dL (ref 70–99)
Sodium: 142 mEq/L (ref 135–145)
Total Bilirubin: 0.4 mg/dL (ref 0.3–1.2)
Total Protein: 7 g/dL (ref 6.0–8.3)

## 2012-01-30 LAB — URINALYSIS
Glucose, UA: NEGATIVE mg/dL
Leukocytes, UA: NEGATIVE
pH: 6 (ref 5.0–8.0)

## 2012-01-30 LAB — LIPID PANEL
Cholesterol: 241 mg/dL — ABNORMAL HIGH (ref 0–200)
HDL: 40 mg/dL (ref >39)
Total CHOL/HDL Ratio: 6 Ratio

## 2012-01-30 LAB — HEPATITIS B SURFACE ANTIBODY,QUALITATIVE: Hep B S Ab: POSITIVE — AB

## 2012-01-30 LAB — T-HELPER CELL (CD4) - (RCID CLINIC ONLY)
CD4 % Helper T Cell: 19 % — ABNORMAL LOW (ref 33–55)
CD4 T Cell Abs: 890 uL (ref 400–2700)

## 2012-02-09 DIAGNOSIS — B20 Human immunodeficiency virus [HIV] disease: Secondary | ICD-10-CM | POA: Insufficient documentation

## 2012-02-09 DIAGNOSIS — G2581 Restless legs syndrome: Secondary | ICD-10-CM | POA: Insufficient documentation

## 2012-02-09 DIAGNOSIS — F329 Major depressive disorder, single episode, unspecified: Secondary | ICD-10-CM | POA: Insufficient documentation

## 2012-02-09 DIAGNOSIS — F419 Anxiety disorder, unspecified: Secondary | ICD-10-CM | POA: Insufficient documentation

## 2012-02-09 NOTE — Progress Notes (Signed)
Pt is transferring from [facility] since he lives in [place].    He has requested assistance with finding a Primary Care Physician.  Diagnosed 2006 and has taken Reyataz- caused eye discolaration    Norvir,  Sustiva - whole body rash which were discontinued .  Pt has c/o "no sex drive" and left eye blurry with grey dots present X 1 1/2 years  which seems to be getting worse floaters and double vision.   Pt has a Psychiatrist who prescribes antianxiety and depression medications.  [facility].   Laurell Josephs, RN

## 2012-02-11 ENCOUNTER — Ambulatory Visit: Payer: Self-pay

## 2012-02-11 ENCOUNTER — Encounter: Payer: Self-pay | Admitting: Internal Medicine

## 2012-02-11 ENCOUNTER — Ambulatory Visit (INDEPENDENT_AMBULATORY_CARE_PROVIDER_SITE_OTHER): Payer: Self-pay | Admitting: Internal Medicine

## 2012-02-11 VITALS — BP 128/81 | HR 81 | Temp 97.9°F | Ht 68.0 in | Wt 192.0 lb

## 2012-02-11 DIAGNOSIS — H532 Diplopia: Secondary | ICD-10-CM

## 2012-02-11 DIAGNOSIS — Z8619 Personal history of other infectious and parasitic diseases: Secondary | ICD-10-CM

## 2012-02-11 DIAGNOSIS — M76899 Other specified enthesopathies of unspecified lower limb, excluding foot: Secondary | ICD-10-CM

## 2012-02-11 DIAGNOSIS — M7070 Other bursitis of hip, unspecified hip: Secondary | ICD-10-CM

## 2012-02-11 DIAGNOSIS — E781 Pure hyperglyceridemia: Secondary | ICD-10-CM

## 2012-02-11 MED ORDER — GEMFIBROZIL 600 MG PO TABS: 600.0000 mg | ORAL_TABLET | Freq: Two times a day (BID) | ORAL | Status: DC

## 2012-02-11 NOTE — Progress Notes (Signed)
HIV CLINIC NOTE  RFV: establishing care at clinic, transfer from [facility] Subjective:    Patient ID: Devin Crawford, male    DOB: 14-Jun-1980, 31 y.o.   MRN: [id number]  HPI [name] is a 31yo Male with HIV, CD 4 count 890(19%) / VL < 20, in September 2013, currently on truvada/kaletra. Last seen in April 2013 at [facility], previously there for 30yrs.[name] HIV diagnosed in 2006. Diagnosed in setting of PCP. 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 He states that after having shingles, He reports having left eye difficulty with floaters for the past 2 yrs. And also has diplopia out of left eye. Has seen 2 eye doctors in the past that were not able to find answers in the beginning of the problem.   Had history of left hip bursitis s/p steroid joint injection in march where he had 30 lbs.   Depression/anxiety on abilify, zoloft 200mg  daily. Decreased libido. Occ. Panic attack 1 /mo.  ART HX: atripla - urticaria, possibly due to EFV Atazanavir - jaundice fosamprenavir Allergies  Allergen Reactions  . Morphine And Related     Hallucinations   . Penicillins     edema  . Sulfa Antibiotics Hives  . Capsaicin Rash  . Sustiva (Efavirenz) Rash    Whole body rash     Current Outpatient Prescriptions on File Prior to Visit  Medication Sig Dispense Refill  . acetaminophen (TYLENOL) 500 MG tablet Take 500 mg by mouth every 6 (six) hours as needed. For pain      . ALPRAZolam (XANAX) 1 MG tablet Take 1 mg by mouth every 4 (four) hours as needed. For anxiety      . ARIPiprazole (ABILIFY) 2 MG tablet Take 2 mg by mouth daily.      [name] atovaquone (MEPRON) 750 MG/5ML suspension Take 1,500 mg by mouth daily.      . baclofen (LIORESAL) 10 MG tablet Take 10 mg by mouth 3 (three) times daily.      [name] emtricitabine-tenofovir (TRUVADA) 200-300 MG per tablet Take 1 tablet by mouth daily.      . fentaNYL (DURAGESIC - DOSED MCG/HR) 25 MCG/HR Place 1 patch onto the skin 2 days.      .  hydrochlorothiazide (HYDRODIURIL) 25 MG tablet Take 1 tablet (25 mg total) by mouth daily.  7 tablet  0  . ibuprofen (ADVIL,MOTRIN) 200 MG tablet Take 400 mg by mouth every 6 (six) hours as needed. For pain.      [name] lopinavir-ritonavir (KALETRA) 200-50 MG per tablet Take 2 tablets by mouth 2 (two) times daily.      . meloxicam (MOBIC) 7.5 MG tablet Take 7.5 mg by mouth daily.      . Multiple Vitamin (MULITIVITAMIN WITH MINERALS) TABS Take 1 tablet by mouth daily.      [name] omeprazole (PRILOSEC) 20 MG capsule Take 40 mg by mouth daily.       [name] oxyCODONE (OXY IR/ROXICODONE) 5 MG immediate release tablet Take 5 mg by mouth every 4 (four) hours as needed. Take two every 6 hours or prn - [name](primary care)      . sertraline (ZOLOFT) 100 MG tablet Take 200 mg by mouth daily.       Active Ambulatory Problems    Diagnosis Date Noted  . Suicidal ideation 06/05/2011  . Human immunodeficiency virus (HIV) disease 02/09/2012  . Restless leg syndrome 02/09/2012  . Depression, major 02/09/2012  . Anxiety 02/09/2012   Resolved Ambulatory  Problems    Diagnosis Date Noted  . No Resolved Ambulatory Problems   Past Medical History  Diagnosis Date  . HIV (human immunodeficiency virus infection)   . Blood transfusion 2007  . Arthritis   . Kidney calculus   . Narcotic addiction   . Multiple pelvic fractures   . History of tonsillectomy   . hip fracture left   . History of spleen injury    Social hx = 3 cigs/day x 3-87months; intermittent for a few years; no drinking; serodiscordant couple x 5 yrs with HIV- MSM.  Family hx = stroke F; anxiety F; MS Mother  Review of Systems  Constitutional: Negative for fever, chills, diaphoresis, activity change, appetite change, fatigue and unexpected weight change.  HENT: Negative for congestion, sore throat, rhinorrhea, sneezing, trouble swallowing and sinus pressure.  Eyes: blurry vision/double vision of left eye  Respiratory: Negative for cough, chest  tightness, shortness of breath, wheezing and stridor.  Cardiovascular: Negative for chest pain, palpitations and leg swelling.  Gastrointestinal: Negative for nausea, vomiting, abdominal pain, diarrhea, constipation, blood in stool, abdominal distention and anal bleeding.  Genitourinary: Negative for dysuria, hematuria, flank pain and difficulty urinating.  Musculoskeletal: Negative for myalgias, back pain, joint swelling, arthralgias and gait problem.  Skin: Negative for color change, pallor, rash and wound.  Neurological: Negative for dizziness, tremors, weakness and light-headedness.  Hematological: Negative for adenopathy. Does not bruise/bleed easily.  Psychiatric/Behavioral: Negative for behavioral problems, confusion, sleep disturbance, dysphoric mood, decreased concentration and agitation.       Objective:   Physical Exam BP 128/81  Pulse 81  Temp 97.9 F (36.6 C) (Oral)  Ht 5\' 8"  (1.727 m)  Wt 192 lb (87.091 kg)  BMI 29.19 kg/m2  General Appearance:    Alert, cooperative, no distress, appears stated age  Head:    Normocephalic, without obvious abnormality, atraumatic  Eyes:    PERRL, conjunctiva/corneas clear, EOM's intact, fundi    benign, both eyes. He has diplopia/blurriness of OS VF      Ears:    Normal TM's and external ear canals, both ears  Nose:   Nares normal, septum midline, mucosa normal, no drainage   or sinus tenderness  Throat:   Lips, mucosa, and tongue normal; teeth and gums normal  Neck:   Supple, symmetrical, trachea midline, no adenopathy;       thyroid:  No enlargement/tenderness/nodules; no carotid   bruit or JVD  Back:     Symmetric, no curvature, ROM normal, no CVA tenderness  Lungs:     Clear to auscultation bilaterally, respirations unlabored  Chest wall:    No tenderness or deformity  Heart:    Regular rate and rhythm, S1 and S2 normal, no murmur, rub   or gallop  Abdomen:     Soft, non-tender, bowel sounds active all four quadrants,    no  masses, no organomegaly        Extremities:   Extremities normal, atraumatic, no cyanosis or edema  Pulses:   2+ and symmetric all extremities  Skin:   Skin color, texture, turgor normal, no rashes or lesions  Lymph nodes:   Cervical, supraclavicular, and axillary nodes normal  Neurologic:   CNII-XII intact. Normal strength, sensation and reflexes      throughout         Assessment & Plan:  HIV = continue on kaletra and truvada, good viral suppression  hypertriglyceridemia = in the 500s, will start gemfibrizol plus fishoil  Depression/anxiety = will refer  to see Bernette Redbird for counseling in addition to his psychiatrist  Monocular diplopia of Left eye (OS) = wondering if he sustained corneal injury/scarring after episode of herpetic zoster which he reports preceded his vision difficulties. Will refer to ophthomology, who accepts orange card.  PCP: Madison Hickman at [facility] in [place]  Psych: Sales executive  rtc in 3 months

## 2012-02-14 DIAGNOSIS — H532 Diplopia: Secondary | ICD-10-CM | POA: Insufficient documentation

## 2012-02-14 DIAGNOSIS — M7070 Other bursitis of hip, unspecified hip: Secondary | ICD-10-CM | POA: Insufficient documentation

## 2012-02-14 DIAGNOSIS — Z8619 Personal history of other infectious and parasitic diseases: Secondary | ICD-10-CM | POA: Insufficient documentation

## 2012-05-04 ENCOUNTER — Other Ambulatory Visit: Payer: Self-pay | Admitting: Licensed Clinical Social Worker

## 2012-05-04 DIAGNOSIS — B20 Human immunodeficiency virus [HIV] disease: Secondary | ICD-10-CM

## 2012-05-04 MED ORDER — LOPINAVIR-RITONAVIR 200-50 MG PO TABS: 2.0000 | ORAL_TABLET | Freq: Two times a day (BID) | ORAL | Status: DC

## 2012-05-04 MED ORDER — EMTRICITABINE-TENOFOVIR DF 200-300 MG PO TABS: 1.0000 | ORAL_TABLET | Freq: Every day | ORAL | Status: DC

## 2012-05-06 ENCOUNTER — Other Ambulatory Visit: Payer: Self-pay

## 2012-05-24 ENCOUNTER — Telehealth: Payer: Self-pay | Admitting: *Deleted

## 2012-05-24 ENCOUNTER — Ambulatory Visit: Payer: Self-pay | Admitting: Internal Medicine

## 2012-05-24 NOTE — Telephone Encounter (Signed)
Generic message left for patient on generic voicemail box reminding patient that he missed today's appointment, and asking him to reschedule at his earliest convenience. Andree Coss, RN

## 2012-07-13 ENCOUNTER — Other Ambulatory Visit: Payer: Self-pay | Admitting: Infectious Diseases

## 2012-07-13 ENCOUNTER — Other Ambulatory Visit (INDEPENDENT_AMBULATORY_CARE_PROVIDER_SITE_OTHER): Payer: No Typology Code available for payment source

## 2012-07-13 DIAGNOSIS — B20 Human immunodeficiency virus [HIV] disease: Secondary | ICD-10-CM

## 2012-07-14 LAB — COMPREHENSIVE METABOLIC PANEL
ALT: 33 U/L (ref 0–53)
Albumin: 4.8 g/dL (ref 3.5–5.2)
Alkaline Phosphatase: 129 U/L — ABNORMAL HIGH (ref 39–117)
Glucose, Bld: 141 mg/dL — ABNORMAL HIGH (ref 70–99)
Potassium: 3.8 mEq/L (ref 3.5–5.3)
Sodium: 143 mEq/L (ref 135–145)
Total Protein: 7.2 g/dL (ref 6.0–8.3)

## 2012-07-14 LAB — CBC WITH DIFFERENTIAL/PLATELET
Basophils Relative: 0 % (ref 0–1)
Hemoglobin: 15.2 g/dL (ref 13.0–17.0)
Lymphs Abs: 2.5 10*3/uL (ref 0.7–4.0)
MCHC: 35.3 g/dL (ref 30.0–36.0)
Monocytes Relative: 5 % (ref 3–12)
Neutro Abs: 5.2 10*3/uL (ref 1.7–7.7)
Neutrophils Relative %: 63 % (ref 43–77)
RBC: 4.51 MIL/uL (ref 4.22–5.81)

## 2012-07-15 LAB — HIV-1 RNA QUANT-NO REFLEX-BLD: HIV-1 RNA Quant, Log: <1.3 {Log} (ref <1.30)

## 2012-07-27 ENCOUNTER — Ambulatory Visit (INDEPENDENT_AMBULATORY_CARE_PROVIDER_SITE_OTHER): Payer: No Typology Code available for payment source | Admitting: Internal Medicine

## 2012-07-27 ENCOUNTER — Other Ambulatory Visit: Payer: Self-pay | Admitting: *Deleted

## 2012-07-27 ENCOUNTER — Encounter: Payer: Self-pay | Admitting: Internal Medicine

## 2012-07-27 VITALS — BP 125/83 | HR 120 | Temp 98.2°F | Ht 68.0 in | Wt 200.0 lb

## 2012-07-27 DIAGNOSIS — K219 Gastro-esophageal reflux disease without esophagitis: Secondary | ICD-10-CM

## 2012-07-27 DIAGNOSIS — R5383 Other fatigue: Secondary | ICD-10-CM

## 2012-07-27 DIAGNOSIS — B20 Human immunodeficiency virus [HIV] disease: Secondary | ICD-10-CM

## 2012-07-27 LAB — CBC WITH DIFFERENTIAL/PLATELET
Basophils Absolute: 0 10*3/uL (ref 0.0–0.1)
Basophils Relative: 0 % (ref 0–1)
HCT: 41.3 % (ref 39.0–52.0)
Hemoglobin: 14.8 g/dL (ref 13.0–17.0)
Lymphocytes Relative: 30 % (ref 12–46)
Lymphs Abs: 3.8 10*3/uL (ref 0.7–4.0)
MCH: 33.9 pg (ref 26.0–34.0)
MCV: 94.5 fL (ref 78.0–100.0)
Monocytes Relative: 8 % (ref 3–12)
Platelets: 261 10*3/uL (ref 150–400)
RBC: 4.37 MIL/uL (ref 4.22–5.81)

## 2012-07-27 MED ORDER — OMEPRAZOLE 40 MG PO CPDR: 40.0000 mg | DELAYED_RELEASE_CAPSULE | Freq: Every day | ORAL | Status: DC

## 2012-07-27 MED ORDER — ELVITEG-COBIC-EMTRICIT-TENOFDF 150-150-200-300 MG PO TABS: 1.0000 | ORAL_TABLET | Freq: Every day | ORAL | Status: DC

## 2012-07-27 NOTE — Progress Notes (Signed)
[facility] CLINIC NOTE  RFV: routine follow up Subjective:    Patient ID: Devin Crawford, male    DOB: Dec 24, 1980, 32 y.o.   MRN: [id number]  HPI  31yo Male, with CD 4 count 380/VL <20,  Currently on truvada/kaletra. Concerned about drop in CD 4 count from high 800s to 300s.   Feeling fatigue but wonders if related to his depression, occ hotflash/cold sweat, different from panic attack for the past month. 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  Current Outpatient Prescriptions on File Prior to Visit  Medication Sig Dispense Refill  . acetaminophen (TYLENOL) 500 MG tablet Take 500 mg by mouth every 6 (six) hours as needed. For pain      . ALPRAZolam (XANAX) 1 MG tablet Take 1 mg by mouth every 4 (four) hours as needed. For anxiety      . ARIPiprazole (ABILIFY) 2 MG tablet Take 2 mg by mouth daily.      [name] atovaquone (MEPRON) 750 MG/5ML suspension Take 1,500 mg by mouth daily.      . baclofen (LIORESAL) 10 MG tablet Take 10 mg by mouth 3 (three) times daily.      [name] emtricitabine-tenofovir (TRUVADA) 200-300 MG per tablet Take 1 tablet by mouth daily.  30 tablet  6  . fentaNYL (DURAGESIC - DOSED MCG/HR) 25 MCG/HR Place 1 patch onto the skin 2 days.      [name] gemfibrozil (LOPID) 600 MG tablet Take 1 tablet (600 mg total) by mouth 2 (two) times daily before a meal.  60 tablet  11  . hydrochlorothiazide (HYDRODIURIL) 25 MG tablet Take 1 tablet (25 mg total) by mouth daily.  7 tablet  0  . ibuprofen (ADVIL,MOTRIN) 200 MG tablet Take 400 mg by mouth every 6 (six) hours as needed. For pain.      [name] lopinavir-ritonavir (KALETRA) 200-50 MG per tablet Take 2 tablets by mouth 2 (two) times daily.  60 tablet  6  . meloxicam (MOBIC) 7.5 MG tablet Take 7.5 mg by mouth daily.      . Multiple Vitamin (MULITIVITAMIN WITH MINERALS) TABS Take 1 tablet by mouth daily.      [name] omeprazole (PRILOSEC)  20 MG capsule Take 40 mg by mouth daily.       [name] oxyCODONE (OXY IR/ROXICODONE) 5 MG immediate release tablet Take 5 mg by mouth every 4 (four) hours as needed. Take two every 6 hours or prn - [name](primary care)      . sertraline (ZOLOFT) 100 MG tablet Take 200 mg by mouth daily.       No current facility-administered medications on file prior to visit.   Active Ambulatory Problems    Diagnosis Date Noted  . Suicidal ideation 06/05/2011  . Human immunodeficiency virus (HIV) disease 02/09/2012  . Restless leg syndrome 02/09/2012  . Depression, major 02/09/2012  . Anxiety 02/09/2012  . Monocular diplopia 02/14/2012  . H/O herpes zoster 02/14/2012  . Bursitis of hip 02/14/2012   Resolved Ambulatory Problems    Diagnosis Date Noted  . No Resolved Ambulatory Problems   Past Medical History  Diagnosis Date  . HIV (human immunodeficiency virus infection)   . Blood transfusion 2007  . Arthritis   . Kidney calculus   . Narcotic addiction   . Multiple pelvic fractures   . History of tonsillectomy   . hip  fracture left   . History of spleen injury     Review of Systems     Objective:   Physical Exam BP 125/83  Pulse 120  Temp(Src) 98.2 F (36.8 C) (Oral)  Ht 5\' 8"  (1.727 m)  Wt 200 lb (90.719 kg)  BMI 30.42 kg/m2 Physical Exam  Constitutional: He is oriented to person, place, and time. He appears well-developed and well-nourished. No distress.  HENT:  Mouth/Throat: Oropharynx is clear and moist. No oropharyngeal exudate.  Cardiovascular: Normal rate, regular rhythm and normal heart sounds. Exam reveals no gallop and no friction rub.  No murmur heard.  Pulmonary/Chest: Effort normal and breath sounds normal. No respiratory distress. He has no wheezes.  Abdominal: Soft. Bowel sounds are normal. He exhibits no distension. There is no tenderness.  Lymphadenopathy:  He has no cervical adenopathy.  Neurological: He is alert and oriented to person, place, and time.  Skin:  Skin is warm and dry. No rash noted. No erythema.  Psychiatric: He has a normal mood and affect. His behavior is normal.       Assessment & Plan:   hiv = wants to switch to one pill per day alternative. Will switch to stribild daily.  Fatigue = tsh, and anemia  aki = noticeable increase in cr from 0.9 to 1.3. Patient states he often does not drink sufficient h20. Will recheck bmp today. May need to reconsider switch from truvada/kaletra to stribild  Constipation = increase miralax, prune juice, liquids  rtc in 4 wks

## 2012-07-28 LAB — BASIC METABOLIC PANEL WITH GFR
BUN: 11 mg/dL (ref 6–23)
CO2: 28 meq/L (ref 19–32)
Calcium: 9.8 mg/dL (ref 8.4–10.5)
Chloride: 105 meq/L (ref 96–112)
Creat: 1.18 mg/dL (ref 0.50–1.35)
Glucose, Bld: 107 mg/dL — ABNORMAL HIGH (ref 70–99)
Potassium: 4.5 meq/L (ref 3.5–5.3)
Sodium: 142 meq/L (ref 135–145)

## 2012-07-28 LAB — T4, FREE: Free T4: 0.81 ng/dL (ref 0.80–1.80)

## 2012-07-28 LAB — TSH: TSH: 0.7 u[IU]/mL (ref 0.350–4.500)

## 2012-08-24 ENCOUNTER — Ambulatory Visit (INDEPENDENT_AMBULATORY_CARE_PROVIDER_SITE_OTHER): Payer: Self-pay | Admitting: Internal Medicine

## 2012-08-24 ENCOUNTER — Encounter: Payer: Self-pay | Admitting: Internal Medicine

## 2012-08-24 ENCOUNTER — Encounter: Payer: Self-pay | Admitting: *Deleted

## 2012-08-24 VITALS — BP 116/66 | HR 112 | Temp 97.9°F | Wt 196.0 lb

## 2012-08-24 DIAGNOSIS — R110 Nausea: Secondary | ICD-10-CM

## 2012-08-24 MED ORDER — ONDANSETRON HCL 4 MG PO TABS: 4.0000 mg | ORAL_TABLET | Freq: Three times a day (TID) | ORAL | Status: DC | PRN

## 2012-08-24 NOTE — Progress Notes (Signed)
[facility] CLINIC NOTE  RFV: routine HIV clinic visit, 1 month follow up Subjective:    Patient ID: Devin Crawford, male    DOB: Sep 20, 1980, 32 y.o.   MRN: [id number]  HPI Devin Crawford is a 32yo Male with HIV, CD 4 count of 380/VL <20, where at his last visit he was changed from kaletra/truvada to stribild at last visit 4 wks ago. Noticing to have some nausea with stribild.  Loving the 1pill/day regimen.  Still has fatigue   Had episode of pleurisy for over a period of a week, now back to normal.  Also suffers fatigue. Currently   Social hx: has partner, chris. Works till 11pm sleeps from 1 am til 12 noon. Not waking through sleep. Does not snore, or apnea. Had been out of work for a year but now improved since he is working.  Current Outpatient Prescriptions on File Prior to Visit  Medication Sig Dispense Refill  . acetaminophen (TYLENOL) 500 MG tablet Take 500 mg by mouth every 6 (six) hours as needed. For pain      . ALPRAZolam (XANAX) 1 MG tablet Take 1 mg by mouth every 4 (four) hours as needed. For anxiety      . ARIPiprazole (ABILIFY) 2 MG tablet Take 2 mg by mouth daily.      . baclofen (LIORESAL) 10 MG tablet Take 10 mg by mouth 3 (three) times daily.      [name] elvitegravir-cobicistat-emtricitabine-tenofovir (STRIBILD) 150-150-200-300 MG TABS Take 1 tablet by mouth daily with breakfast.  30 tablet  11  . fentaNYL (DURAGESIC - DOSED MCG/HR) 25 MCG/HR Place 1 patch onto the skin 2 days.      [name] gemfibrozil (LOPID) 600 MG tablet Take 1 tablet (600 mg total) by mouth 2 (two) times daily before a meal.  60 tablet  11  . Multiple Vitamin (MULITIVITAMIN WITH MINERALS) TABS Take 1 tablet by mouth daily.      [name] omeprazole (PRILOSEC) 40 MG capsule Take 1 capsule (40 mg total) by mouth daily.  30 capsule  11  . oxyCODONE (OXY IR/ROXICODONE) 5 MG immediate release tablet Take 5 mg by mouth every 4 (four) hours as needed. Take two every 6 hours or prn - [name](primary care)      . sertraline (ZOLOFT)  100 MG tablet Take 200 mg by mouth daily.      . traMADol (ULTRAM) 50 MG tablet Take 50 mg by mouth every 6 (six) hours as needed for pain.       No current facility-administered medications on file prior to visit.      Review of Systems 10 point ROS is negative other than what is mentioned in HPI    Objective:   Physical Exam BP 116/66  Pulse 112  Temp(Src) 97.9 F (36.6 C) (Oral)  Wt 196 lb (88.905 kg)  BMI 29.81 kg/m2 Physical Exam  Constitutional: He is oriented to person, place, and time. He appears well-developed and well-nourished. No distress.  HENT:  Mouth/Throat: Oropharynx is clear and moist. No oropharyngeal exudate.  Cardiovascular: Normal rate, regular rhythm and normal heart sounds. Exam reveals no gallop and no friction rub.  No murmur heard.  Pulmonary/Chest: Effort normal and breath sounds normal. No respiratory distress. He has no wheezes.  Abdominal: Soft. Bowel sounds are normal. He exhibits no distension. There is no tenderness.  Lymphadenopathy:  He has no cervical adenopathy.  Neurological: He is alert and oriented to person, place, and time.  Skin: Skin is warm  and dry. No rash noted. No erythema.  Psychiatric: He has a normal mood and affect. His behavior is normal.       Assessment & Plan:  HIV = continue with stribild doing well on new regimen Nausea side effect= will prescribe Anti-emetic  rtc in 3 month  Needs work release note from April 4th.

## 2012-08-24 NOTE — Progress Notes (Signed)
HPI: Devin Crawford is a 32 y.o. male with hx of HIV who is here for his follow up visit.  Allergies: Allergies  Allergen Reactions  . Morphine And Related     Hallucinations   . Penicillins     edema  . Sulfa Antibiotics Hives  . Capsaicin Rash  . Sustiva (Efavirenz) Rash    Whole body rash     Vitals: Temp: 97.9 F (36.6 C) (04/08 1051) Temp src: Oral (04/08 1051) BP: 116/66 mmHg (04/08 1051) Pulse Rate: 112 (04/08 1051)  Past Medical History: Past Medical History  Diagnosis Date  . HIV (human immunodeficiency virus infection)   . Blood transfusion 2007  . Arthritis   . Kidney calculus   . Narcotic addiction   . Multiple pelvic fractures   . History of tonsillectomy   . hip fracture left     rod placement 04-2006  . History of spleen injury     removal 2007    Social History: History   Social History  . Marital Status: Single    Spouse Name: N/A    Number of Children: N/A  . Years of Education: N/A   Social History Main Topics  . Smoking status: Current Every Day Smoker -- 0.20 packs/day for 9 years    Types: Cigarettes  . Smokeless tobacco: Never Used  . Alcohol Use: No  . Drug Use: No  . Sexually Active: Yes -- Male partner(s)    Birth Control/ Protection: Condom     Comment: pt. declined condoms   Other Topics Concern  . None   Social History Narrative  . None    Previous Regimen:   Current Regimen: Stribild  Labs: HIV 1 RNA Quant (copies/mL)  Date Value  07/13/2012 <20   01/29/2012 <20      CD4 T Cell Abs (cmm)  Date Value  07/13/2012 380*  01/29/2012 890      Hep B S Ab (no units)  Date Value  01/29/2012 POS*     Hepatitis B Surface Ag (no units)  Date Value  01/29/2012 NEGATIVE      HCV Ab (no units)  Date Value  01/29/2012 NEGATIVE     CrCl: The CrCl is unknown because both a height and weight (above a minimum accepted value) are required for this calculation.  Lipids:    Component Value Date/Time   CHOL 241*  01/29/2012 1141   TRIG 581* 01/29/2012 1141   HDL 40 01/29/2012 1141   CHOLHDL 6.0 01/29/2012 1141   VLDL NOT CALC 01/29/2012 1141   LDLCALC Comment:   Not calculated due to Triglyceride >400. Suggest ordering Direct LDL (Unit Code: 08657).   Total Cholesterol/HDL Ratio:CHD Risk                        Coronary Heart Disease Risk Table                                        Men       Women          1/2 Average Risk              3.4        3.3              Average Risk  5.0        4.4           2X Average Risk              9.6        7.1           3X Average Risk             23.4       11.0 Use the calculated Patient Ratio above and the CHD Risk table  to determine the patient's CHD Risk. ATP III Classification (LDL):       < 100        mg/dL         Optimal      161 - 129     mg/dL         Near or Above Optimal      130 - 159     mg/dL         Borderline High      160 - 189     mg/dL         High       > 096        mg/dL         Very High   0/45/4098 1141    Assessment: Pt has been switched over to stribild for consolidation. Overall, he is doing well with his HIV. His CD4 dipped with the last lab drawn but VL is undetectable. He does complain of nausea and has missed one day of work for it. I told him he needs to call the clinic if he has issues with his meds. I offer him PRN zofran to take for nausea. He was agreeable to it. Still needs to get his antidepressant meds adjusted but will let his psychiatrist do that. He will also get a lipid panel at the next visit.   Recommendations: Cont Stribild Zofran 4mg  PRN for nausea  Clide Cliff, PharmD Clinical Infectious Disease Pharmacist [facility] 08/24/2012, 2:07 PM

## 2012-08-25 ENCOUNTER — Encounter: Payer: Self-pay | Admitting: *Deleted

## 2012-10-12 ENCOUNTER — Other Ambulatory Visit: Payer: Self-pay | Admitting: Internal Medicine

## 2012-10-12 ENCOUNTER — Other Ambulatory Visit: Payer: Self-pay

## 2012-10-12 DIAGNOSIS — B20 Human immunodeficiency virus [HIV] disease: Secondary | ICD-10-CM

## 2012-10-28 ENCOUNTER — Ambulatory Visit: Payer: Self-pay | Admitting: Internal Medicine

## 2012-10-29 ENCOUNTER — Telehealth: Payer: Self-pay | Admitting: *Deleted

## 2012-10-29 NOTE — Telephone Encounter (Signed)
This patient can be scheduled one more time before he needs to start coming to walk-in clinic. Thanks [name] Muir

## 2012-10-29 NOTE — Telephone Encounter (Signed)
Called patient to see if I could reschedule his missed appt and had to leave a message on his voicemail to call the office and do so asap.

## 2012-12-14 ENCOUNTER — Other Ambulatory Visit (INDEPENDENT_AMBULATORY_CARE_PROVIDER_SITE_OTHER): Payer: Self-pay

## 2012-12-14 DIAGNOSIS — B20 Human immunodeficiency virus [HIV] disease: Secondary | ICD-10-CM

## 2012-12-14 LAB — CBC WITH DIFFERENTIAL/PLATELET
Basophils Relative: 0 % (ref 0–1)
Hemoglobin: 14.7 g/dL (ref 13.0–17.0)
Lymphocytes Relative: 36 % (ref 12–46)
MCHC: 35.4 g/dL (ref 30.0–36.0)
Monocytes Relative: 7 % (ref 3–12)
Neutro Abs: 7.7 10*3/uL (ref 1.7–7.7)
Neutrophils Relative %: 55 % (ref 43–77)
RBC: 4.35 MIL/uL (ref 4.22–5.81)
WBC: 14 10*3/uL — ABNORMAL HIGH (ref 4.0–10.5)

## 2012-12-14 LAB — COMPREHENSIVE METABOLIC PANEL
AST: 18 U/L (ref 0–37)
Albumin: 4.5 g/dL (ref 3.5–5.2)
Alkaline Phosphatase: 108 U/L (ref 39–117)
Glucose, Bld: 103 mg/dL — ABNORMAL HIGH (ref 70–99)
Potassium: 3.9 mEq/L (ref 3.5–5.3)
Sodium: 139 mEq/L (ref 135–145)
Total Protein: 6.7 g/dL (ref 6.0–8.3)

## 2012-12-15 LAB — T-HELPER CELL (CD4) - (RCID CLINIC ONLY)
CD4 % Helper T Cell: 19 % — ABNORMAL LOW (ref 33–55)
CD4 T Cell Abs: 1110 uL (ref 400–2700)

## 2012-12-15 LAB — HIV-1 RNA QUANT-NO REFLEX-BLD: HIV-1 RNA Quant, Log: <1.3 {Log} (ref <1.30)

## 2013-01-03 ENCOUNTER — Ambulatory Visit (INDEPENDENT_AMBULATORY_CARE_PROVIDER_SITE_OTHER): Payer: Self-pay | Admitting: Internal Medicine

## 2013-01-03 ENCOUNTER — Encounter: Payer: Self-pay | Admitting: Internal Medicine

## 2013-01-03 VITALS — BP 123/80 | HR 98 | Temp 97.8°F | Ht 68.5 in | Wt 196.0 lb

## 2013-01-03 DIAGNOSIS — Z23 Encounter for immunization: Secondary | ICD-10-CM

## 2013-01-03 DIAGNOSIS — E781 Pure hyperglyceridemia: Secondary | ICD-10-CM

## 2013-01-03 NOTE — Progress Notes (Signed)
[facility] CLINIC NOTE  RFV: routine  Subjective:    Patient ID: Devin Crawford, male    DOB: [birth year], 32 y.o.   MRN: [id number]  HPI 31yo M with HIV, CD 4 count 1110/VL<20, on stribild.he reports doing well, no missing a dose. Overall, continues to be busy, attempting to have better diet.  No fever,chills, nightsweats, cough, diarrhea or recent illness  Current Outpatient Prescriptions on File Prior to Visit  Medication Sig Dispense Refill  . acetaminophen (TYLENOL) 500 MG tablet Take 500 mg by mouth every 6 (six) hours as needed. For pain      . ALPRAZolam (XANAX) 1 MG tablet Take 1 mg by mouth every 4 (four) hours as needed. For anxiety      . elvitegravir-cobicistat-emtricitabine-tenofovir (STRIBILD) 150-150-200-300 MG TABS Take 1 tablet by mouth daily with breakfast.  30 tablet  11  . fentaNYL (DURAGESIC - DOSED MCG/HR) 25 MCG/HR Place 1 patch onto the skin 2 days.      [name] gemfibrozil (LOPID) 600 MG tablet Take 1 tablet (600 mg total) by mouth 2 (two) times daily before a meal.  60 tablet  11  . meloxicam (MOBIC) 15 MG tablet Take 15 mg by mouth 3 (three) times a week. Pt takes as needed      . Multiple Vitamin (MULITIVITAMIN WITH MINERALS) TABS Take 1 tablet by mouth daily.      [name] omeprazole (PRILOSEC) 40 MG capsule Take 1 capsule (40 mg total) by mouth daily.  30 capsule  11  . oxyCODONE (OXY IR/ROXICODONE) 5 MG immediate release tablet Take 5 mg by mouth every 4 (four) hours as needed. Take two every 6 hours or prn - [name](primary care)      . polyethylene glycol (MIRALAX / GLYCOLAX) packet Take 17 g by mouth daily.      . sertraline (ZOLOFT) 100 MG tablet Take 200 mg by mouth daily.      . traMADol (ULTRAM) 50 MG tablet Take 50 mg by mouth every 6 (six) hours as needed for pain.       No current facility-administered medications on file prior to visit.     Review of Systems 12 point ROS is negative    Objective:   Physical Exam BP 123/80  Pulse 98  Temp(Src) 97.8  F (36.6 C) (Oral)  Ht 5' 8.5" (1.74 m)  Wt 196 lb (88.905 kg)  BMI 29.36 kg/m2 Physical Exam  Constitutional: He is oriented to person, place, and time. He appears well-developed and well-nourished. No distress.  HENT:  Mouth/Throat: Oropharynx is clear and moist. No oropharyngeal exudate.  Cardiovascular: Normal rate, regular rhythm and normal heart sounds. Exam reveals no gallop and no friction rub.  No murmur heard.  Pulmonary/Chest: Effort normal and breath sounds normal. No respiratory distress. He has no wheezes.  Abdominal: Soft. Bowel sounds are normal. He exhibits no distension. There is no tenderness.  Lymphadenopathy:  He has no cervical adenopathy.  Neurological: He is alert and oriented to person, place, and time.  Skin: Skin is warm and dry. No rash noted. No erythema.  Psychiatric: He has a normal mood and affect. His behavior is normal.          Assessment & Plan:  Hiv = continue with stribild  Health maintenance = get hep A vaccine today. And then #2 dose in 6 months. Back in 6 wk for flu  Hypertriglyceridemia = will repeat fasting in the coming weeks, then decide on treatment. Currently on  gemfibrizol.

## 2013-01-26 ENCOUNTER — Ambulatory Visit: Payer: Self-pay

## 2013-03-07 ENCOUNTER — Ambulatory Visit (INDEPENDENT_AMBULATORY_CARE_PROVIDER_SITE_OTHER): Payer: Self-pay | Admitting: Licensed Clinical Social Worker

## 2013-03-07 DIAGNOSIS — Z23 Encounter for immunization: Secondary | ICD-10-CM

## 2013-03-22 ENCOUNTER — Other Ambulatory Visit: Payer: Self-pay

## 2013-04-05 ENCOUNTER — Ambulatory Visit: Payer: Self-pay | Admitting: Internal Medicine

## 2013-04-08 ENCOUNTER — Telehealth: Payer: Self-pay | Admitting: *Deleted

## 2013-04-08 NOTE — Telephone Encounter (Signed)
Called the patient about his no show this week and got his voicemail. Left a message for him to call and reschedule asap.

## 2013-04-25 ENCOUNTER — Other Ambulatory Visit (INDEPENDENT_AMBULATORY_CARE_PROVIDER_SITE_OTHER): Payer: Self-pay

## 2013-04-25 DIAGNOSIS — Z79899 Other long term (current) drug therapy: Secondary | ICD-10-CM

## 2013-04-25 DIAGNOSIS — B20 Human immunodeficiency virus [HIV] disease: Secondary | ICD-10-CM

## 2013-04-25 DIAGNOSIS — E781 Pure hyperglyceridemia: Secondary | ICD-10-CM

## 2013-04-25 DIAGNOSIS — Z113 Encounter for screening for infections with a predominantly sexual mode of transmission: Secondary | ICD-10-CM

## 2013-04-25 LAB — COMPREHENSIVE METABOLIC PANEL
AST: 34 U/L (ref 0–37)
Albumin: 4.4 g/dL (ref 3.5–5.2)
BUN: 10 mg/dL (ref 6–23)
CO2: 29 mEq/L (ref 19–32)
Calcium: 9.4 mg/dL (ref 8.4–10.5)
Chloride: 103 mEq/L (ref 96–112)
Glucose, Bld: 70 mg/dL (ref 70–99)
Potassium: 4 mEq/L (ref 3.5–5.3)

## 2013-04-25 LAB — LIPID PANEL

## 2013-04-26 LAB — T-HELPER CELL (CD4) - (RCID CLINIC ONLY)
CD4 % Helper T Cell: 17 % — ABNORMAL LOW (ref 33–55)
CD4 T Cell Abs: 1140 /uL (ref 400–2700)

## 2013-04-26 LAB — CBC WITH DIFFERENTIAL/PLATELET
Basophils Absolute: 0 10*3/uL (ref 0.0–0.1)
Eosinophils Relative: 3 % (ref 0–5)
HCT: 42.4 % (ref 39.0–52.0)
Lymphocytes Relative: 55 % — ABNORMAL HIGH (ref 12–46)
Lymphs Abs: 6.4 10*3/uL — ABNORMAL HIGH (ref 0.7–4.0)
MCV: 94.6 fL (ref 78.0–100.0)
Monocytes Absolute: 0.8 10*3/uL (ref 0.1–1.0)
Neutro Abs: 4 10*3/uL (ref 1.7–7.7)
RBC: 4.48 MIL/uL (ref 4.22–5.81)
RDW: 14.6 % (ref 11.5–15.5)
WBC: 11.6 10*3/uL — ABNORMAL HIGH (ref 4.0–10.5)

## 2013-04-26 LAB — RPR

## 2013-04-27 LAB — HIV-1 RNA QUANT-NO REFLEX-BLD: HIV-1 RNA Quant, Log: <1.3 {Log} (ref <1.30)

## 2013-05-02 ENCOUNTER — Telehealth: Payer: Self-pay | Admitting: *Deleted

## 2013-05-02 NOTE — Telephone Encounter (Signed)
Patient was reviewing his lab results on mychart and saw a pathologist smear and wanted to know what that was about. He said he looked it up on web MD and was concerned. I am not sure why this was ordered so I told him I would send to Dr. Drue Second and call him back once she reviews. Devin Crawford

## 2013-05-09 ENCOUNTER — Other Ambulatory Visit: Payer: Self-pay | Admitting: Internal Medicine

## 2013-05-09 ENCOUNTER — Encounter: Payer: Self-pay | Admitting: Internal Medicine

## 2013-05-09 ENCOUNTER — Ambulatory Visit (INDEPENDENT_AMBULATORY_CARE_PROVIDER_SITE_OTHER): Payer: Self-pay | Admitting: Internal Medicine

## 2013-05-09 VITALS — BP 123/82 | HR 79 | Temp 98.3°F | Ht 68.5 in | Wt 207.0 lb

## 2013-05-09 DIAGNOSIS — B20 Human immunodeficiency virus [HIV] disease: Secondary | ICD-10-CM

## 2013-05-09 DIAGNOSIS — D72820 Lymphocytosis (symptomatic): Secondary | ICD-10-CM

## 2013-05-09 DIAGNOSIS — F411 Generalized anxiety disorder: Secondary | ICD-10-CM

## 2013-05-09 LAB — CBC WITH DIFFERENTIAL/PLATELET
Basophils Absolute: 0 10*3/uL (ref 0.0–0.1)
Eosinophils Relative: 1 % (ref 0–5)
HCT: 44 % (ref 39.0–52.0)
Lymphocytes Relative: 49 % — ABNORMAL HIGH (ref 12–46)
MCV: 94.4 fL (ref 78.0–100.0)
Monocytes Absolute: 0.6 10*3/uL (ref 0.1–1.0)
Neutro Abs: 5.2 10*3/uL (ref 1.7–7.7)
RDW: 14.7 % (ref 11.5–15.5)
WBC: 11.5 10*3/uL — ABNORMAL HIGH (ref 4.0–10.5)

## 2013-05-09 MED ORDER — ELVITEG-COBIC-EMTRICIT-TENOFDF 150-150-200-300 MG PO TABS: 1.0000 | ORAL_TABLET | Freq: Every day | ORAL | Status: DC

## 2013-05-09 MED ORDER — CLONAZEPAM 0.5 MG PO TABS: 0.5000 mg | ORAL_TABLET | Freq: Two times a day (BID) | ORAL | Status: DC | PRN

## 2013-05-09 NOTE — Progress Notes (Signed)
Subjective:    Patient ID: Devin Crawford, male    DOB: 1980/07/07, 32 y.o.   MRN: [id number]  HPI 32yo M with HIV, CD 4 count of / VL<20, on stribild who  Get him set up with kenny to discuss better management depression.  Fentanyl, oxycodone -> stopped 3 days ago. Xanax 1mg  daily  Current Outpatient Prescriptions on File Prior to Visit  Medication Sig Dispense Refill  . acetaminophen (TYLENOL) 500 MG tablet Take 500 mg by mouth every 6 (six) hours as needed. For pain      . ALPRAZolam (XANAX) 1 MG tablet Take 1 mg by mouth every 4 (four) hours as needed. For anxiety      . elvitegravir-cobicistat-emtricitabine-tenofovir (STRIBILD) 150-150-200-300 MG TABS Take 1 tablet by mouth daily with breakfast.  30 tablet  11  . fentaNYL (DURAGESIC - DOSED MCG/HR) 25 MCG/HR Place 1 patch onto the skin 2 days.      . Multiple Vitamin (MULITIVITAMIN WITH MINERALS) TABS Take 1 tablet by mouth daily.      [name] omeprazole (PRILOSEC) 40 MG capsule Take 1 capsule (40 mg total) by mouth daily.  30 capsule  11  . oxyCODONE (OXY IR/ROXICODONE) 5 MG immediate release tablet Take 5 mg by mouth every 4 (four) hours as needed. Take two every 6 hours or prn - [name](primary care)      . polyethylene glycol (MIRALAX / GLYCOLAX) packet Take 17 g by mouth daily.      . sertraline (ZOLOFT) 100 MG tablet Take 200 mg by mouth daily.      . traMADol (ULTRAM) 50 MG tablet Take 50 mg by mouth every 6 (six) hours as needed for pain.      [name] gemfibrozil (LOPID) 600 MG tablet Take 1 tablet (600 mg total) by mouth 2 (two) times daily before a meal.  60 tablet  11   No current facility-administered medications on file prior to visit.     Review of Systems Severe depression, no energy Removed himself of pain medication, taking away his energy     Objective:   Physical Exam BP 123/82  Pulse 79  Temp(Src) 98.3 F (36.8 C) (Oral)  Ht 5' 8.5" (1.74 m)  Wt 207 lb (93.895 kg)  BMI 31.01 kg/m2 Physical Exam    Constitutional: He is oriented to person, place, and time. He appears well-developed and well-nourished. No distress.  HENT:  Mouth/Throat: Oropharynx is clear and moist. No oropharyngeal exudate.  Cardiovascular: Normal rate, regular rhythm and normal heart sounds. Exam reveals no gallop and no friction rub.  No murmur heard.  Pulmonary/Chest: Effort normal and breath sounds normal. No respiratory distress. He has no wheezes.  Abdominal: Soft. Bowel sounds are normal. He exhibits no distension. There is no tenderness.  Lymphadenopathy:  He has no cervical adenopathy.  Neurological: He is alert and oriented to person, place, and time.  Skin: Skin is warm and dry. No rash noted. No erythema.  Psychiatric: He has a normal mood and affect. His behavior is normal.   LABS: Lab Results  Component Value Date   WBC 11.5* 05/09/2013   HGB 15.4 05/09/2013   HCT 44.0 05/09/2013   MCV 94.4 05/09/2013   PLT 367 05/09/2013       Assessment & Plan:  hiv = continue with stribild.  Depression = will have him set up with kenny  Chronic pain for pelvis injury = attempting to self wean from opiates but feeling bearable pain,  Now  feeling anxious, and sweating easily, insomnia.  Lymphocytosis = will repeat cbc with diff  Smoking cessation = contemplative trying.  Hypertriglyceridemia = start lopid

## 2013-05-09 NOTE — Telephone Encounter (Signed)
Lab results explained to patient at visit on 05/09/13. Devin Crawford

## 2013-05-10 LAB — PATHOLOGIST SMEAR REVIEW

## 2013-05-24 ENCOUNTER — Ambulatory Visit: Payer: Self-pay | Admitting: *Deleted

## 2013-05-24 DIAGNOSIS — F329 Major depressive disorder, single episode, unspecified: Secondary | ICD-10-CM

## 2013-05-24 NOTE — Progress Notes (Signed)
Patient ID: Devin Crawford, male   DOB: [birth year], 33 y.o.   MRN: [id number] Met [name] in first appt and addressed his reported depression. He reports weaning self off of Zoloft in recent months as he believes it is no longer helping him. However, he is open to trying other psychotropics but needs to do so affordably. Will explore appt at [facility] for psyc follow-up. Pt is open to this. In exploring his depressive symptoms, he acknowledges vague SI with no plan, intent, or means. He describes these thoughts as fleeting and depression is manifested through low energy and loss of interest in activities. Devin Crawford ties much of this to his recent job loss and resulting lack of daily focus. Supports include partner, Devin Crawford, a friend Devin Crawford, and his mother who lives in [place]. Devin Crawford describes his partner relationship as somewhat sustaining although his partner has limited understanding of his depression. Counselor processed with pt the availability of counseling support at [facility] and the plan for him to engage [facility] in ongoing therapy for depression. Devin Crawford was positive about this as well as seeing this Probation officer in the interim. Pt scheduled to return next week for further planning and assessment. Devin Crawford affirmed that he is safe today, no thoughts of self harm, and that he intends to pursue healthy options. He was encouraged to contact [facility] with questions or if feeling increasingly depressed. While mildly tearful during session, affect was appropriately variable and pt engaged Probation officer well. Intervention was effective as evidenced by patient voluntary self-disclosures and active engagement in planning.   Devin Crawford, [facility]

## 2013-05-25 ENCOUNTER — Telehealth: Payer: Self-pay | Admitting: *Deleted

## 2013-05-25 NOTE — Telephone Encounter (Signed)
FYI

## 2013-05-25 NOTE — Telephone Encounter (Signed)
Message copied by Macy MisOCKERHAM, Joakim Huesman A on Wed May 25, 2013  4:28 PM ------      Message from: Neale BurlyMENIUS, MAX E      Created: Tue May 24, 2013 11:58 AM       Hi Dr. Drue SecondSnider - A general FYI. I saw [name] today and he is scheduled to return next week to see me again until we can get him in to see Bernette RedbirdKenny. He has been tapering off of Zoloft but is open to trying new anti-depressant medication. I wasn't sure if that was something you wanted to pursue with him so I addressed with him a [facility] which [name] was open to. He cannot afford his private psychiatrist since losing his job.             Thanks, Max ------

## 2013-05-27 ENCOUNTER — Inpatient Hospital Stay (HOSPITAL_COMMUNITY)
Admission: AD | Admit: 2013-05-27 | Discharge: 2013-05-31 | DRG: 885 | Disposition: A | Discharge status: Home / Self Care | Payer: No Typology Code available for payment source | Attending: Psychiatry | Admitting: Psychiatry

## 2013-05-27 ENCOUNTER — Encounter (HOSPITAL_COMMUNITY): Payer: Self-pay | Admitting: Emergency Medicine

## 2013-05-27 ENCOUNTER — Emergency Department (HOSPITAL_COMMUNITY)
Admission: EM | Admit: 2013-05-27 | Discharge: 2013-05-27 | Disposition: A | Discharge status: Other Acute Inpatient Hospital | Payer: Self-pay | Attending: Emergency Medicine | Admitting: Emergency Medicine

## 2013-05-27 DIAGNOSIS — F3289 Other specified depressive episodes: Secondary | ICD-10-CM | POA: Insufficient documentation

## 2013-05-27 DIAGNOSIS — Z79899 Other long term (current) drug therapy: Secondary | ICD-10-CM

## 2013-05-27 DIAGNOSIS — Z87442 Personal history of urinary calculi: Secondary | ICD-10-CM | POA: Insufficient documentation

## 2013-05-27 DIAGNOSIS — F419 Anxiety disorder, unspecified: Secondary | ICD-10-CM | POA: Diagnosis present

## 2013-05-27 DIAGNOSIS — F32A Depression, unspecified: Secondary | ICD-10-CM

## 2013-05-27 DIAGNOSIS — F1920 Other psychoactive substance dependence, uncomplicated: Secondary | ICD-10-CM | POA: Diagnosis present

## 2013-05-27 DIAGNOSIS — F17200 Nicotine dependence, unspecified, uncomplicated: Secondary | ICD-10-CM | POA: Insufficient documentation

## 2013-05-27 DIAGNOSIS — E785 Hyperlipidemia, unspecified: Secondary | ICD-10-CM | POA: Diagnosis present

## 2013-05-27 DIAGNOSIS — F4310 Post-traumatic stress disorder, unspecified: Secondary | ICD-10-CM | POA: Diagnosis present

## 2013-05-27 DIAGNOSIS — Z8781 Personal history of (healed) traumatic fracture: Secondary | ICD-10-CM | POA: Insufficient documentation

## 2013-05-27 DIAGNOSIS — Z87828 Personal history of other (healed) physical injury and trauma: Secondary | ICD-10-CM | POA: Insufficient documentation

## 2013-05-27 DIAGNOSIS — R45851 Suicidal ideations: Secondary | ICD-10-CM

## 2013-05-27 DIAGNOSIS — G4700 Insomnia, unspecified: Secondary | ICD-10-CM | POA: Diagnosis present

## 2013-05-27 DIAGNOSIS — B20 Human immunodeficiency virus [HIV] disease: Secondary | ICD-10-CM | POA: Diagnosis present

## 2013-05-27 DIAGNOSIS — F1120 Opioid dependence, uncomplicated: Secondary | ICD-10-CM | POA: Diagnosis present

## 2013-05-27 DIAGNOSIS — G894 Chronic pain syndrome: Secondary | ICD-10-CM | POA: Diagnosis present

## 2013-05-27 DIAGNOSIS — Z21 Asymptomatic human immunodeficiency virus [HIV] infection status: Secondary | ICD-10-CM | POA: Insufficient documentation

## 2013-05-27 DIAGNOSIS — F329 Major depressive disorder, single episode, unspecified: Secondary | ICD-10-CM

## 2013-05-27 DIAGNOSIS — E781 Pure hyperglyceridemia: Secondary | ICD-10-CM | POA: Diagnosis present

## 2013-05-27 DIAGNOSIS — D72829 Elevated white blood cell count, unspecified: Secondary | ICD-10-CM | POA: Diagnosis present

## 2013-05-27 DIAGNOSIS — Z9089 Acquired absence of other organs: Secondary | ICD-10-CM | POA: Insufficient documentation

## 2013-05-27 DIAGNOSIS — Z9889 Other specified postprocedural states: Secondary | ICD-10-CM | POA: Insufficient documentation

## 2013-05-27 DIAGNOSIS — M129 Arthropathy, unspecified: Secondary | ICD-10-CM | POA: Insufficient documentation

## 2013-05-27 DIAGNOSIS — Z823 Family history of stroke: Secondary | ICD-10-CM

## 2013-05-27 DIAGNOSIS — F411 Generalized anxiety disorder: Secondary | ICD-10-CM | POA: Diagnosis present

## 2013-05-27 DIAGNOSIS — F332 Major depressive disorder, recurrent severe without psychotic features: Principal | ICD-10-CM | POA: Diagnosis present

## 2013-05-27 HISTORY — DX: Major depressive disorder, single episode, unspecified: F32.9

## 2013-05-27 HISTORY — DX: Depression, unspecified: F32.A

## 2013-05-27 HISTORY — DX: Mental disorder, not otherwise specified: F99

## 2013-05-27 LAB — CBC WITH DIFFERENTIAL/PLATELET
BASOS ABS: 0 10*3/uL (ref 0.0–0.1)
Basophils Relative: 0 % (ref 0–1)
EOS ABS: 0 10*3/uL (ref 0.0–0.7)
EOS PCT: 0 % (ref 0–5)
HCT: 45.3 % (ref 39.0–52.0)
Hemoglobin: 15.9 g/dL (ref 13.0–17.0)
LYMPHS PCT: 36 % (ref 12–46)
Lymphs Abs: 4.8 10*3/uL — ABNORMAL HIGH (ref 0.7–4.0)
MCH: 33.1 pg (ref 26.0–34.0)
MCHC: 35.1 g/dL (ref 30.0–36.0)
MCV: 94.4 fL (ref 78.0–100.0)
Monocytes Absolute: 0.8 10*3/uL (ref 0.1–1.0)
Monocytes Relative: 6 % (ref 3–12)
Neutro Abs: 7.8 10*3/uL — ABNORMAL HIGH (ref 1.7–7.7)
Neutrophils Relative %: 58 % (ref 43–77)
PLATELETS: 358 10*3/uL (ref 150–400)
RBC: 4.8 MIL/uL (ref 4.22–5.81)
RDW: 14.5 % (ref 11.5–15.5)
WBC: 13.4 10*3/uL — AB (ref 4.0–10.5)

## 2013-05-27 LAB — COMPREHENSIVE METABOLIC PANEL
ALBUMIN: 4.9 g/dL (ref 3.5–5.2)
ALT: 19 U/L (ref 0–53)
AST: 19 U/L (ref 0–37)
Alkaline Phosphatase: 155 U/L — ABNORMAL HIGH (ref 39–117)
BUN: 14 mg/dL (ref 6–23)
CALCIUM: 9.7 mg/dL (ref 8.4–10.5)
CO2: 20 mEq/L (ref 19–32)
Chloride: 107 mEq/L (ref 96–112)
Creatinine, Ser: 1.04 mg/dL (ref 0.50–1.35)
GFR calc Af Amer: >90 mL/min (ref >90)
GFR calc non Af Amer: >90 mL/min (ref >90)
Glucose, Bld: 91 mg/dL (ref 70–99)
Potassium: 4 mEq/L (ref 3.7–5.3)
SODIUM: 145 meq/L (ref 137–147)
Total Bilirubin: 0.4 mg/dL (ref 0.3–1.2)
Total Protein: 7.9 g/dL (ref 6.0–8.3)

## 2013-05-27 LAB — URINALYSIS, ROUTINE W REFLEX MICROSCOPIC
Bilirubin Urine: NEGATIVE
Glucose, UA: NEGATIVE mg/dL
Hgb urine dipstick: NEGATIVE
KETONES UR: NEGATIVE mg/dL
Leukocytes, UA: NEGATIVE
NITRITE: NEGATIVE
Protein, ur: NEGATIVE mg/dL
SPECIFIC GRAVITY, URINE: 1.02 (ref 1.005–1.030)
Urobilinogen, UA: 0.2 mg/dL (ref 0.0–1.0)
pH: 6 (ref 5.0–8.0)

## 2013-05-27 LAB — ETHANOL

## 2013-05-27 LAB — RAPID URINE DRUG SCREEN, HOSP PERFORMED
Amphetamines: NOT DETECTED
BENZODIAZEPINES: POSITIVE — AB
Barbiturates: NOT DETECTED
COCAINE: NOT DETECTED
Opiates: POSITIVE — AB
Tetrahydrocannabinol: POSITIVE — AB

## 2013-05-27 LAB — SALICYLATE LEVEL: Salicylate Lvl: 2.6 mg/dL — ABNORMAL LOW (ref 2.8–20.0)

## 2013-05-27 LAB — ACETAMINOPHEN LEVEL: Acetaminophen (Tylenol), Serum: <15 ug/mL (ref 10–30)

## 2013-05-27 MED ORDER — GEMFIBROZIL 600 MG PO TABS
600.0000 mg | ORAL_TABLET | Freq: Two times a day (BID) | ORAL | Status: DC
  Filled 2013-05-27: qty 1

## 2013-05-27 MED ORDER — NICOTINE 21 MG/24HR TD PT24: 21.0000 mg | MEDICATED_PATCH | Freq: Every day | TRANSDERMAL | Status: DC

## 2013-05-27 MED ORDER — ONDANSETRON HCL 4 MG PO TABS: 4.0000 mg | ORAL_TABLET | Freq: Three times a day (TID) | ORAL | Status: DC | PRN

## 2013-05-27 MED ORDER — SERTRALINE HCL 50 MG PO TABS
200.0000 mg | ORAL_TABLET | Freq: Every day | ORAL | Status: DC
  Administered 2013-05-27: 200 mg via ORAL
  Filled 2013-05-27: qty 4

## 2013-05-27 MED ORDER — ELVITEG-COBIC-EMTRICIT-TENOFDF 150-150-200-300 MG PO TABS
1.0000 | ORAL_TABLET | Freq: Every day | ORAL | Status: DC
  Filled 2013-05-27: qty 1

## 2013-05-27 MED ORDER — ALPRAZOLAM 1 MG PO TABS
1.0000 mg | ORAL_TABLET | ORAL | Status: DC | PRN
  Administered 2013-05-27: 1 mg via ORAL
  Filled 2013-05-27: qty 1

## 2013-05-27 MED ORDER — PANTOPRAZOLE SODIUM 40 MG PO TBEC
40.0000 mg | DELAYED_RELEASE_TABLET | Freq: Every day | ORAL | Status: DC
  Administered 2013-05-27: 40 mg via ORAL
  Filled 2013-05-27: qty 1

## 2013-05-27 NOTE — ED Notes (Signed)
[facility], rm [id number].  Pending [facility] transport.

## 2013-05-27 NOTE — ED Notes (Signed)
TTS assessment with [facility] in progress at present. 

## 2013-05-27 NOTE — BH Assessment (Signed)
Received call for tele-assessment. Spoke with Dr. Cathren LaineKevin Steinl who said Pt has been very depressed and was suicidal with plan to hang himself with an extension cord. Tele-assessment will be initiated.  Harlin RainFord Ellis Ria CommentWarrick Jr, LPC, [facility] Triage Specialist

## 2013-05-27 NOTE — ED Notes (Addendum)
Pt here due to SI. Pt states he saw an extension cord at home and had thoughts of strangling himself with it.  Pt does have HIV.

## 2013-05-27 NOTE — Discharge Instructions (Signed)
Transfer to [facility] 

## 2013-05-27 NOTE — ED Provider Notes (Signed)
CSN: [id number]     Arrival date & time 05/27/13  1654 History  This chart was scribed for non-physician practitioner Devin Mead, PA-C working with Devin Relic, MD by Devin Crawford, ED Scribe. This patient was seen in room WTR2/WLPT2 and the patient's care was started at 1654.   Chief Complaint  Patient presents with  . Medical Clearance   The history is provided by the patient. No language interpreter was used.    HPI Comments: Devin Crawford is a 33 y.o. Male with past medical history of HIV postitive who presents to the Emergency Department complaining of 6 months of constant SI that worsened today. Pt states that the suicidal plan was to hang himself with an extension cord on the floor but refrained when he had thoughts of his partner and mother. Pt has a history of depression for the past 14 years and is currently taking Zoloft and Xanax. He reports no longer seeing a psychiatrist due to financial difficulties. Pt denies chest pain, SOB, dyspnea, audiovisual hallucinations, HI, nausea, vomiting, insomnia, and dysuria. He denies alcohol or street drug use. He is a 1 cigarette per day smoker and is attempting to quit.   Pt is positive for HIV and is currently taking Stribild and he has been compliant with this medication. Devin Crawford is his infectious disease doctor and Devin Crawford is his primary care provider. His last CD4 count was 1140 with a negative viral load.   Past Medical History  Diagnosis Date  . HIV (human immunodeficiency virus infection)   . Blood transfusion 2007  . Arthritis   . Kidney calculus   . Narcotic addiction   . Multiple pelvic fractures   . History of tonsillectomy   . hip fracture left     rod placement 04-2006  . History of spleen injury     removal 2007  . Mental disorder   . Depression    Past Surgical History  Procedure Laterality Date  . Splenectomy    . Hip fracture surgery    . Tonsillectomy     Family History  Problem Relation Age of  Onset  . Multiple sclerosis Mother   . Stroke Father    History  Substance Use Topics  . Smoking status: Current Every Day Smoker -- 0.20 packs/day for 9 years    Types: Cigarettes  . Smokeless tobacco: Never Used  . Alcohol Use: No    Review of Systems  Respiratory: Negative for shortness of breath.   Cardiovascular: Negative for chest pain.  Gastrointestinal: Negative for nausea and vomiting.  Genitourinary: Negative for dysuria.  Psychiatric/Behavioral: Positive for suicidal ideas. Negative for hallucinations.  All other systems reviewed and are negative.    Allergies  Morphine and related; Penicillins; Sulfa antibiotics; Capsaicin; and Sustiva  Home Medications   No current outpatient prescriptions on file. BP 130/87  Pulse 85  Temp(Src) 97.7 F (36.5 C) (Oral)  Resp 20  SpO2 94% Physical Exam  Nursing note and vitals reviewed. Constitutional: He is oriented to person, place, and time. He appears well-developed and well-nourished.  HENT:  Head: Normocephalic and atraumatic.  Mouth/Throat: Oropharynx is clear and moist. No oropharyngeal exudate.  Eyes: Conjunctivae and EOM are normal. Pupils are equal, round, and reactive to light. Right eye exhibits no discharge. Left eye exhibits no discharge.  Neck: Normal range of motion. Neck supple.  Cardiovascular: Normal rate, regular rhythm and normal heart sounds.   Pulmonary/Chest: Effort normal. No respiratory distress. He has no  wheezes. He has no rales.  Abdominal: Soft. Bowel sounds are normal. There is no tenderness. There is no guarding.  Negative pain upon palpation-negative acute abdomen, negative peritoneal signs  Musculoskeletal: Normal range of motion.  Full ROM to upper and lower extremities without difficulty noted, negative ataxia noted.  Lymphadenopathy:    He has no cervical adenopathy.  Neurological: He is alert and oriented to person, place, and time. No cranial nerve deficit. He exhibits normal muscle  tone. Coordination normal.  Cranial nerves III through XII grossly intact Strength intact with resistance applied, equal distribution noted to upper and lower extremities bilaterally  Skin: Skin is warm and dry.  Psychiatric:  Interactive and pleasant Tearful upon examination and assessment Good eye contact Goal oriented    ED Course  Procedures  DIAGNOSTIC STUDIES: Oxygen Saturation is 97% on RA, adequate by my interpretation.    COORDINATION OF CARE: 5:08 PM Discussed treatment plan with pt at bedside and pt agreed to plan.  Results for orders placed during the hospital encounter of 05/27/13  CBC WITH DIFFERENTIAL      Result Value Range   WBC 13.4 (*) 4.0 - 10.5 K/uL   RBC 4.80  4.22 - 5.81 MIL/uL   Hemoglobin 15.9  13.0 - 17.0 g/dL   HCT 45.3  39.0 - 52.0 %   MCV 94.4  78.0 - 100.0 fL   MCH 33.1  26.0 - 34.0 pg   MCHC 35.1  30.0 - 36.0 g/dL   RDW 14.5  11.5 - 15.5 %   Platelets 358  150 - 400 K/uL   Neutrophils Relative % 58  43 - 77 %   Neutro Abs 7.8 (*) 1.7 - 7.7 K/uL   Lymphocytes Relative 36  12 - 46 %   Lymphs Abs 4.8 (*) 0.7 - 4.0 K/uL   Monocytes Relative 6  3 - 12 %   Monocytes Absolute 0.8  0.1 - 1.0 K/uL   Eosinophils Relative 0  0 - 5 %   Eosinophils Absolute 0.0  0.0 - 0.7 K/uL   Basophils Relative 0  0 - 1 %   Basophils Absolute 0.0  0.0 - 0.1 K/uL  COMPREHENSIVE METABOLIC PANEL      Result Value Range   Sodium 145  137 - 147 mEq/L   Potassium 4.0  3.7 - 5.3 mEq/L   Chloride 107  96 - 112 mEq/L   CO2 20  19 - 32 mEq/L   Glucose, Bld 91  70 - 99 mg/dL   BUN 14  6 - 23 mg/dL   Creatinine, Ser 1.04  0.50 - 1.35 mg/dL   Calcium 9.7  8.4 - 10.5 mg/dL   Total Protein 7.9  6.0 - 8.3 g/dL   Albumin 4.9  3.5 - 5.2 g/dL   AST 19  0 - 37 U/L   ALT 19  0 - 53 U/L   Alkaline Phosphatase 155 (*) 39 - 117 U/L   Total Bilirubin 0.4  0.3 - 1.2 mg/dL   GFR calc non Af Amer >90  >90 mL/min   GFR calc Af Amer >32  >95 mL/min  SALICYLATE LEVEL      Result  Value Range   Salicylate Lvl 2.6 (*) 2.8 - 20.0 mg/dL  ACETAMINOPHEN LEVEL      Result Value Range   Acetaminophen (Tylenol), Serum <15.0  10 - 30 ug/mL  ETHANOL      Result Value Range   Alcohol, Ethyl (B) <11  0 -  11 mg/dL  URINALYSIS, ROUTINE W REFLEX MICROSCOPIC      Result Value Range   Color, Urine YELLOW  YELLOW   APPearance CLEAR  CLEAR   Specific Gravity, Urine 1.020  1.005 - 1.030   pH 6.0  5.0 - 8.0   Glucose, UA NEGATIVE  NEGATIVE mg/dL   Hgb urine dipstick NEGATIVE  NEGATIVE   Bilirubin Urine NEGATIVE  NEGATIVE   Ketones, ur NEGATIVE  NEGATIVE mg/dL   Protein, ur NEGATIVE  NEGATIVE mg/dL   Urobilinogen, UA 0.2  0.0 - 1.0 mg/dL   Nitrite NEGATIVE  NEGATIVE   Leukocytes, UA NEGATIVE  NEGATIVE  URINE RAPID DRUG SCREEN (HOSP PERFORMED)      Result Value Range   Opiates POSITIVE (*) NONE DETECTED   Cocaine NONE DETECTED  NONE DETECTED   Benzodiazepines POSITIVE (*) NONE DETECTED   Amphetamines NONE DETECTED  NONE DETECTED   Tetrahydrocannabinol POSITIVE (*) NONE DETECTED   Barbiturates NONE DETECTED  NONE DETECTED    Labs Review Labs Reviewed  CBC WITH DIFFERENTIAL - Abnormal; Notable for the following:    WBC 13.4 (*)    Neutro Abs 7.8 (*)    Lymphs Abs 4.8 (*)    All other components within normal limits  COMPREHENSIVE METABOLIC PANEL - Abnormal; Notable for the following:    Alkaline Phosphatase 155 (*)    All other components within normal limits  SALICYLATE LEVEL - Abnormal; Notable for the following:    Salicylate Lvl 2.6 (*)    All other components within normal limits  URINE RAPID DRUG SCREEN (HOSP PERFORMED) - Abnormal; Notable for the following:    Opiates POSITIVE (*)    Benzodiazepines POSITIVE (*)    Tetrahydrocannabinol POSITIVE (*)    All other components within normal limits  ACETAMINOPHEN LEVEL  ETHANOL  URINALYSIS, ROUTINE W REFLEX MICROSCOPIC   Imaging Review No results found.  EKG Interpretation   None       MDM   1.  Depression   2. Suicidal ideation     Filed Vitals:   05/27/13 1700 05/27/13 1941 05/27/13 2154  BP: 136/84 118/79 130/87  Pulse: 97 86 85  Temp: 98.1 F (36.7 C) 97.8 F (36.6 C) 97.7 F (36.5 C)  TempSrc: Oral Oral Oral  Resp: $Remo'16 18 20  'sdDnP$ SpO2: 97% 98% 94%   I personally performed the services described in this documentation, which was scribed in my presence. The recorded information has been reviewed and is accurate.  Patient presenting to emergency Department history of depression and suicidal ideation. Reports that the suicidal ideation has been ongoing for the past 6 months with worsening and frequent thoughts that occurred today. Patient reports that he noticed an extension cord and was attempted to use it to hang himself today. Reported that he called his mother who recommended that patient be seen and assessed in the emergency department. Patient reports he was diagnosed with depression when he was 33 years of age. Patient reports he has HIV, is seen by Dr. West Pugh that his last T4 cell count was 1140-is currently taking medications, antivirals-every morning. Patient is medically compliant. Primary care provider is Dr. Corine Crawford. Reports he has not seen a therapist in a long time secondary to medical care issues and insurance issues. Alert and oriented. GCS 15. Heart rate and rhythm normal. Lungs good auscultation bilaterally. Radial and DP pulses 2+ bilaterally. Abdomen soft, nontender upon palpation. Full range of motion to upper lower extremities bilaterally without difficulty noted,  negative ataxia. Strength intact with equal distribution. Sensation intact. Interactive pleasant. Tearful intermittently upon physical exam assessment. Goal oriented. Urine negative findings for infection. Urine drug screen positive for cannabis, benzos, and opiates. CBC negative acute findings. CMP negative findings-mild elevation of alkaline phosphatase of 155, patient appears to have a  mildly elevated alk phos when compared to previous levels. Salicylate level low, 2.6. Acetaminophen and ethanol level negative elevation. Patient stable, afebrile. Patient medically cleared. Patient moved to psych ED in [place]. Psych holding orders have been placed. Medications reordered.  Discussed case and labs with Dr. Rosine Abe at change in shift. Transfer of care to Dr. Rosine Abe at change in shift.   Devin Mead, PA-C 05/28/13 1020

## 2013-05-27 NOTE — ED Provider Notes (Signed)
Psych team indicates pt accepted to [facility], bed ready, F Hobson/Dr Jonnalagadda. Pt alert, content, nad. Vitals normal.   Suzi RootsKevin E Aalani Aikens, MD 05/27/13 2148
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
## 2013-05-27 NOTE — ED Notes (Signed)
Pt transferred from Main ED, presents for medical clearance, requesting help for SI thoughts, plan to strangle self with extension cord.  HIV pt feeling hopeless.  Denies HI or AV hallucinations.  Pt reports psych past of Major Depressive DO.  Pt calm & cooperative at present.

## 2013-05-27 NOTE — BH Assessment (Signed)
Assessment complete. Devin HobbyLuwanda Crawford, [facility]. Consulted with Alberteen SamFran Hobson, NP who accepted Pt to the service of Dr. Mervyn GayJ. Jonnalagadda, room 501-1. Notified Dr. Cathren LaineKevin Steinl of acceptance.  Harlin RainFord Ellis Ria CommentWarrick Jr, LPC, [facility] Triage Specialist

## 2013-05-28 ENCOUNTER — Encounter (HOSPITAL_COMMUNITY): Payer: Self-pay | Admitting: *Deleted

## 2013-05-28 DIAGNOSIS — E781 Pure hyperglyceridemia: Secondary | ICD-10-CM | POA: Diagnosis present

## 2013-05-28 DIAGNOSIS — F1920 Other psychoactive substance dependence, uncomplicated: Secondary | ICD-10-CM | POA: Diagnosis present

## 2013-05-28 DIAGNOSIS — E785 Hyperlipidemia, unspecified: Secondary | ICD-10-CM

## 2013-05-28 DIAGNOSIS — F411 Generalized anxiety disorder: Secondary | ICD-10-CM

## 2013-05-28 DIAGNOSIS — G894 Chronic pain syndrome: Secondary | ICD-10-CM | POA: Diagnosis present

## 2013-05-28 DIAGNOSIS — B20 Human immunodeficiency virus [HIV] disease: Secondary | ICD-10-CM

## 2013-05-28 DIAGNOSIS — F332 Major depressive disorder, recurrent severe without psychotic features: Secondary | ICD-10-CM | POA: Diagnosis present

## 2013-05-28 LAB — LIPID PANEL
CHOL/HDL RATIO: 6.9 ratio
CHOLESTEROL: 220 mg/dL — AB (ref 0–200)
HDL: 32 mg/dL — AB (ref >39)
LDL Cholesterol: 164 mg/dL — ABNORMAL HIGH (ref 0–99)
Triglycerides: 119 mg/dL (ref <150)
VLDL: 24 mg/dL (ref 0–40)

## 2013-05-28 MED ORDER — TRAMADOL HCL 50 MG PO TABS
50.0000 mg | ORAL_TABLET | Freq: Four times a day (QID) | ORAL | Status: DC | PRN
  Administered 2013-05-28 – 2013-05-30 (×4): 50 mg via ORAL
  Filled 2013-05-28 (×4): qty 1

## 2013-05-28 MED ORDER — ACETAMINOPHEN 325 MG PO TABS
650.0000 mg | ORAL_TABLET | Freq: Four times a day (QID) | ORAL | Status: DC | PRN
  Administered 2013-05-28 – 2013-05-29 (×3): 650 mg via ORAL
  Filled 2013-05-28 (×3): qty 2

## 2013-05-28 MED ORDER — HYDROXYZINE HCL 25 MG PO TABS
25.0000 mg | ORAL_TABLET | Freq: Four times a day (QID) | ORAL | Status: DC | PRN
  Administered 2013-05-28 – 2013-05-30 (×4): 25 mg via ORAL
  Filled 2013-05-28 (×4): qty 1

## 2013-05-28 MED ORDER — FENTANYL 25 MCG/HR TD PT72
25.0000 ug | MEDICATED_PATCH | TRANSDERMAL | Status: DC
  Administered 2013-05-28 – 2013-05-31 (×2): 25 ug via TRANSDERMAL
  Filled 2013-05-28 (×2): qty 1

## 2013-05-28 MED ORDER — OXYCODONE HCL 5 MG PO TABS
5.0000 mg | ORAL_TABLET | Freq: Four times a day (QID) | ORAL | Status: DC | PRN
  Administered 2013-05-28 – 2013-05-31 (×9): 5 mg via ORAL
  Filled 2013-05-28 (×9): qty 1

## 2013-05-28 MED ORDER — NICOTINE 21 MG/24HR TD PT24
21.0000 mg | MEDICATED_PATCH | Freq: Every day | TRANSDERMAL | Status: DC
  Filled 2013-05-28 (×3): qty 1

## 2013-05-28 MED ORDER — MAGNESIUM HYDROXIDE 400 MG/5ML PO SUSP: 30.0000 mL | Freq: Every day | ORAL | Status: DC | PRN

## 2013-05-28 MED ORDER — BUPROPION HCL ER (XL) 150 MG PO TB24
150.0000 mg | ORAL_TABLET | Freq: Every day | ORAL | Status: DC
  Administered 2013-05-29 – 2013-05-31 (×3): 150 mg via ORAL
  Filled 2013-05-28: qty 14
  Filled 2013-05-28 (×4): qty 1

## 2013-05-28 MED ORDER — SERTRALINE HCL 100 MG PO TABS
200.0000 mg | ORAL_TABLET | Freq: Every day | ORAL | Status: DC
  Administered 2013-05-28 – 2013-05-31 (×4): 200 mg via ORAL
  Filled 2013-05-28 (×5): qty 2
  Filled 2013-05-28: qty 28

## 2013-05-28 MED ORDER — TRAZODONE HCL 50 MG PO TABS
50.0000 mg | ORAL_TABLET | Freq: Every evening | ORAL | Status: DC | PRN
  Administered 2013-05-28 – 2013-05-29 (×2): 50 mg via ORAL
  Filled 2013-05-28: qty 1

## 2013-05-28 MED ORDER — PANTOPRAZOLE SODIUM 40 MG PO TBEC
40.0000 mg | DELAYED_RELEASE_TABLET | Freq: Every day | ORAL | Status: DC
  Administered 2013-05-28 – 2013-05-31 (×4): 40 mg via ORAL
  Filled 2013-05-28 (×6): qty 1

## 2013-05-28 MED ORDER — HYDROXYZINE HCL 50 MG PO TABS
50.0000 mg | ORAL_TABLET | Freq: Once | ORAL | Status: AC
  Administered 2013-05-28: 50 mg via ORAL
  Filled 2013-05-28 (×2): qty 1

## 2013-05-28 MED ORDER — BUPROPION HCL ER (XL) 150 MG PO TB24
ORAL_TABLET | ORAL | Status: AC
  Administered 2013-05-28: 10:00:00
  Filled 2013-05-28: qty 1

## 2013-05-28 MED ORDER — GEMFIBROZIL 600 MG PO TABS
600.0000 mg | ORAL_TABLET | Freq: Two times a day (BID) | ORAL | Status: DC
  Administered 2013-05-28 – 2013-05-31 (×7): 600 mg via ORAL
  Filled 2013-05-28 (×12): qty 1

## 2013-05-28 MED ORDER — ELVITEG-COBIC-EMTRICIT-TENOFDF 150-150-200-300 MG PO TABS
1.0000 | ORAL_TABLET | Freq: Every day | ORAL | Status: DC
  Administered 2013-05-28 – 2013-05-31 (×4): 1 via ORAL
  Filled 2013-05-28 (×6): qty 1

## 2013-05-28 MED ORDER — CLONIDINE HCL 0.1 MG PO TABS: 0.1000 mg | ORAL_TABLET | Freq: Three times a day (TID) | ORAL | Status: DC | PRN

## 2013-05-28 MED ORDER — FENTANYL 12 MCG/HR TD PT72: 25.0000 ug | MEDICATED_PATCH | TRANSDERMAL | Status: DC

## 2013-05-28 MED ORDER — ALUM & MAG HYDROXIDE-SIMETH 200-200-20 MG/5ML PO SUSP
30.0000 mL | ORAL | Status: DC | PRN
Start: 2013-05-28 — End: 2013-05-31

## 2013-05-28 NOTE — Consult Note (Signed)
[facility] Consult Note  Devin Crawford [id number]   Patients out patient PCP is HEPLER,MARK, PA-C Consult requested in the Hospital by Oneita Kras*, On 05/28/2013   Reason for consult: Evaluation and management recommendations for pain management and HIV  With History of - Active Problems:   Severe recurrent major depression without psychotic features   Dyslipidemia   Hypertriglyceridemia    Past Medical History  Diagnosis Date  . HIV (human immunodeficiency virus infection)   . Blood transfusion 2007  . Arthritis   . Kidney calculus   . Narcotic addiction   . Multiple pelvic fractures   . History of tonsillectomy   . hip fracture left     rod placement 04-2006  . History of spleen injury     removal 2007  . Mental disorder   . Depression      Past Surgical History  Procedure Laterality Date  . Splenectomy    . Hip fracture surgery    . Tonsillectomy      Past Surgical History  Procedure Laterality Date  . Splenectomy    . Hip fracture surgery    . Tonsillectomy      HPI:-  Devin Crawford [id number] is a 33 y.o. male, The patient has recently been admitted to behavioral health with depression.  He has a history of chronic pain secondary to a severe crushed hip injury in 2007.  He had been taking fentanyl patches and oxycodone.  He has a history of dyslipidemia, depression, kidney stones and HIV. He reports that he recently started taking gemfibrozil for Hypertriglyceridemia. The patient had tried to wean himself off his pain meds a couple of weeks ago but reported that the pain became so severe that he had to restart his meds.  A medical consultation was requested to assist with managing his pain medications and HIV medications.     Review of Systems   Constitutional: negative Eyes: negative for color blindness, contacts/glasses and glaucoma Ears, nose, mouth, throat, and face: negative for earaches,  epistaxis, facial trauma, hearing loss, hoarseness and nasal congestion Respiratory: negative for chronic bronchitis, cough, dyspnea on exertion, emphysema and hemoptysis Cardiovascular: negative for chest pain, chest pressure/discomfort, claudication, dyspnea, exertional chest pressure/discomfort and irregular heart beat Gastrointestinal: negative for abdominal pain, change in bowel habits, constipation, diarrhea and dyspepsia Hematologic/lymphatic: negative for bleeding, easy bruising, lymphadenopathy and petechiae Musculoskeletal:negative for arthralgias, back pain, bone pain, muscle weakness and myalgias Neurological: negative for coordination problems, dizziness, gait problems, headaches and memory problems Endocrine: negative for diabetic symptoms including blurry vision, increased fatigue, polydipsia and polyphagia, fertility problems and temperature intolerance  Social History History  Substance Use Topics  . Smoking status: Current Every Day Smoker -- 0.20 packs/day for 9 years    Types: Cigarettes  . Smokeless tobacco: Never Used  . Alcohol Use: No   Family History Family History  Problem Relation Age of Onset  . Multiple sclerosis Mother   . Stroke Father    Prior to Admission medications   Medication Sig Start Date End Date Taking? Authorizing Provider  acetaminophen (TYLENOL) 500 MG tablet Take 500 mg by mouth every 6 (six) hours as needed. For pain    Historical Provider, MD  ALPRAZolam Prudy Feeler) 1 MG tablet Take 1 mg by mouth every 4 (four) hours as needed. For anxiety    Historical Provider, MD  elvitegravir-cobicistat-emtricitabine-tenofovir (STRIBILD) 150-150-200-300 MG TABS tablet Take 1 tablet by mouth daily with breakfast. 05/09/13   Judyann Munson, MD  fentaNYL (  DURAGESIC - DOSED MCG/HR) 25 MCG/HR patch Place 25 mcg onto the skin every other day.    Historical Provider, MD  gemfibrozil (LOPID) 600 MG tablet Take 1 tablet (600 mg total) by mouth 2 (two) times daily  before a meal. 02/11/12   Judyann Munsonynthia Snider, MD  Multiple Vitamin (MULITIVITAMIN WITH MINERALS) TABS Take 1 tablet by mouth daily.    Historical Provider, MD  omeprazole (PRILOSEC) 40 MG capsule Take 1 capsule (40 mg total) by mouth daily. 07/27/12   Judyann Munsonynthia Snider, MD  oxyCODONE (OXY IR/ROXICODONE) 5 MG immediate release tablet Take 5 mg by mouth every 6 (six) hours as needed for severe pain.    Historical Provider, MD  polyethylene glycol (MIRALAX / GLYCOLAX) packet Take 17 g by mouth daily.    Historical Provider, MD  sertraline (ZOLOFT) 100 MG tablet Take 200 mg by mouth daily.    Historical Provider, MD  traMADol (ULTRAM) 50 MG tablet Take 50 mg by mouth every 6 (six) hours as needed for pain.    Historical Provider, MD    Allergies  Allergen Reactions  . Morphine And Related     Hallucinations   . Penicillins     edema  . Sulfa Antibiotics Hives  . Capsaicin Rash  . Sustiva [Efavirenz] Rash    Whole body rash     Physical Exam No intake or output data in the 24 hours ending 05/28/13 0958 Blood pressure 118/82, pulse 97, temperature 97.5 F (36.4 C), temperature source Oral, resp. rate 16, height 5' 8.5" (1.74 m), weight 206 lb (93.441 kg).  General appearance: alert, cooperative, appears stated age and no distress Head: Normocephalic, without obvious abnormality, atraumatic Eyes: conjunctivae/corneas clear. PERRL, EOM's intact. Fundi benign. Ears: normal TM's and external ear canals both ears Nose: Nares normal. Septum midline. Mucosa normal. No drainage or sinus tenderness., no discharge Throat: lips, mucosa, and tongue normal; teeth and gums normal Neck: no adenopathy, no carotid bruit, no JVD, supple, symmetrical, trachea midline and thyroid not enlarged, symmetric, no tenderness/mass/nodules Lungs: clear to auscultation bilaterally and normal percussion bilaterally Chest wall: no tenderness Heart: regular rate and rhythm, S1, S2 normal, no murmur, click, rub or  gallop Abdomen: soft, non-tender; bowel sounds normal; no masses,  no organomegaly Extremities: extremities normal, atraumatic, no cyanosis or edema and large scar from hip surgery seen  Neurologic: Alert and oriented X 3, normal strength and tone. Normal symmetric reflexes. Normal coordination and gait  Data Review CBC w Diff:  Lab Results  Component Value Date   WBC 13.4* 05/27/2013   HGB 15.9 05/27/2013   HCT 45.3 05/27/2013   PLT 358 05/27/2013   LYMPHOPCT 36 05/27/2013   MONOPCT 6 05/27/2013   EOSPCT 0 05/27/2013   BASOPCT 0 05/27/2013    CMP:  Lab Results  Component Value Date   NA 145 05/27/2013   K 4.0 05/27/2013   CL 107 05/27/2013   CO2 20 05/27/2013   BUN 14 05/27/2013   CREATININE 1.04 05/27/2013   CREATININE 1.11 04/25/2013   PROT 7.9 05/27/2013   ALBUMIN 4.9 05/27/2013   BILITOT 0.4 05/27/2013   ALKPHOS 155* 05/27/2013   AST 19 05/27/2013   ALT 19 05/27/2013   Coagulation:  No results found for this basename: PROTIME, INR, PTT   Assessment & Plan  Dyslipidemia / Hypertriglyceridemia - recommend resuming home dose of gemfibrozil daily - check fasting lipid panel in AM   Chronic Pain Syndrome - Recommend resuming fentanyl patch 25 mcg every 72  hours, oxycodone 5 mg prn breakthru pain  Depression / Anxiety Disorder  - Management per behavioral health team   HIV - Recommend resuming home Stribild daily  Leukocytosis  - no active signs of infection - repeat CBC in AM  Tobacco User The patient was counseled on the dangers of tobacco use, and was advised to quit.  Reviewed strategies to maximize success, including removing cigarettes and smoking materials from environment and stress management.   Thank you for the consult, we will follow the patient with you in the Hospital.  Rodney Langton, MD, CDE, FAAFP [facility] [place], [place]  Pager [phone]

## 2013-05-28 NOTE — Progress Notes (Signed)
33 year old male pt admitted on voluntary basis. Pt reports worsening depression and suicidal thoughts. Pt reports that his depression became worse after losing his job about a year ago and has not been able to find work since and also the recent passing of his partner's grandmother in July. Pt also reports some financial concerns and that his partner has to work 2 jobs since he is currently unemployed. Pt denies any ETOH or drug use. Pt does have passive SI on admission but able to contract for safety on the unit. Pt was oriented to the unit and safety maintained.

## 2013-05-28 NOTE — ED Provider Notes (Signed)
Medical screening examination/treatment/procedure(s) were performed by non-physician practitioner and as supervising physician I was immediately available for consultation/collaboration.  EKG Interpretation   None        Yanelie Abraha M Eylin Pontarelli, MD 05/28/13 1247 

## 2013-05-28 NOTE — [facility] Group Notes (Signed)
[facility] Group Notes:  (Clinical Social Work)  @DATE @    3:00-4:00PM  Summary of Progress/Problems:   The main focus of today's process group was for the patient to identify ways in which they have sabotaged their own mental health wellness/recovery.  Motivational interviewing was used to explore the reasons they engage in this behavior, and reasons they may have for wanting to change.  Scaling of 1 (no motivation) to 10 (complete motivation) was used for the patient to identify where they are with regard to changing the self-defeating behavior discussed by them in group today.  The patient expressed that he self-sabotages by focusing on the past, which is out of his control, asking himself constantly "what if, what if."  He states he is completely ready to change (at 10/10), and has complete confidence that he can change (10/10).  Type of Therapy:  Process Group  Participation Level:  Active  Participation Quality:  Attentive and Sharing  Affect:  Blunted and Depressed  Cognitive:  Appropriate and Oriented  Insight:  Engaged  Engagement in Therapy:  Engaged  Modes of Intervention:  Education, Motivational Interviewing   Pilgrim's PrideMareida Grossman-Orr, LCSW 05/28/2013, 4:00pm

## 2013-05-28 NOTE — Tx Team (Addendum)
Initial Interdisciplinary Treatment Plan  PATIENT STRENGTHS: (choose at least two) Ability for insight Average or above average intelligence Capable of independent living General fund of knowledge  PATIENT STRESSORS: Financial difficulties Occupational concerns   PROBLEM LIST: Problem List/Patient Goals Date to be addressed Date deferred Reason deferred Estimated date of resolution  Depression 05/27/13                                                      DISCHARGE CRITERIA:  Ability to meet basic life and health needs Improved stabilization in mood, thinking, and/or behavior Verbal commitment to aftercare and medication compliance  PRELIMINARY DISCHARGE PLAN: Attend aftercare/continuing care group Return to previous living arrangement  PATIENT/FAMIILY INVOLVEMENT: This treatment plan has been presented to and reviewed with the patient, Devin Crawford, and/or family member, .  The patient and family have been given the opportunity to ask questions and make suggestions.  [name], [place] 05/28/2013, 12:23 AM

## 2013-05-28 NOTE — Progress Notes (Signed)
[facility] Group Notes:  (Nursing/MHT/Case Management/Adjunct)  Date:  05/28/2013  Time:  10:33 PM  Type of Therapy:  Group Therapy  Participation Level:  Active  Participation Quality:  Appropriate  Affect:  Appropriate  Cognitive:  Appropriate  Insight:  Appropriate  Engagement in Group:  Engaged  Modes of Intervention:  Discussion  Summary of Progress/Problems:The pt expressed that his day started rocky. But as the day progressed he adjusted to the hospital and his day was better.  Octavio Mannshigpen, Shahrzad Koble Lee 05/28/2013, 10:33 PM

## 2013-05-28 NOTE — [facility] Suicide Risk Assessment (Signed)
Suicide Risk Assessment  Admission Assessment     Nursing information obtained from:    Demographic factors:    Current Mental Status:    Loss Factors:    Historical Factors:    Risk Reduction Factors:     CLINICAL FACTORS:   Severe Anxiety and/or Agitation Depression:   Anhedonia Comorbid alcohol abuse/dependence Hopelessness Impulsivity Insomnia Alcohol/Substance Abuse/Dependencies  COGNITIVE FEATURES THAT CONTRIBUTE TO RISK:  Closed-mindedness Polarized thinking    SUICIDE RISK:   Minimal: No identifiable suicidal ideation.  Patients presenting with no risk factors but with morbid ruminations; may be classified as minimal risk based on the severity of the depressive symptoms  PLAN OF CARE:1. Admit for crisis management and stabilization. 2. Medication management to reduce current symptoms to base line and improve the     patient's overall level of functioning 3. Treat health problems as indicated. 4. Develop treatment plan to decrease risk of relapse upon discharge and the need for     readmission. 5. Psycho-social education regarding relapse prevention and self care. 6. Health care follow up as needed for medical problems. 7. Restart home medications where appropriate.  I certify that inpatient services furnished can reasonably be expected to improve the patient's condition.  Devin Crawford, Devin Solow, MD 05/28/2013, 10:17 AM
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
## 2013-05-29 DIAGNOSIS — R45851 Suicidal ideations: Secondary | ICD-10-CM

## 2013-05-29 DIAGNOSIS — G894 Chronic pain syndrome: Secondary | ICD-10-CM

## 2013-05-29 LAB — CBC WITH DIFFERENTIAL/PLATELET
Basophils Absolute: 0 10*3/uL (ref 0.0–0.1)
Basophils Relative: 0 % (ref 0–1)
EOS ABS: 0.2 10*3/uL (ref 0.0–0.7)
EOS PCT: 2 % (ref 0–5)
HCT: 44.9 % (ref 39.0–52.0)
Hemoglobin: 15.1 g/dL (ref 13.0–17.0)
LYMPHS ABS: 7 10*3/uL — AB (ref 0.7–4.0)
Lymphocytes Relative: 63 % — ABNORMAL HIGH (ref 12–46)
MCH: 32.5 pg (ref 26.0–34.0)
MCHC: 33.6 g/dL (ref 30.0–36.0)
MCV: 96.8 fL (ref 78.0–100.0)
Monocytes Absolute: 1 10*3/uL (ref 0.1–1.0)
Monocytes Relative: 9 % (ref 3–12)
Neutro Abs: 2.9 10*3/uL (ref 1.7–7.7)
Neutrophils Relative %: 26 % — ABNORMAL LOW (ref 43–77)
PLATELETS: 370 10*3/uL (ref 150–400)
RBC: 4.64 MIL/uL (ref 4.22–5.81)
RDW: 14.4 % (ref 11.5–15.5)
WBC: 11.1 10*3/uL — ABNORMAL HIGH (ref 4.0–10.5)

## 2013-05-29 MED ORDER — SENNOSIDES-DOCUSATE SODIUM 8.6-50 MG PO TABS: 1.0000 | ORAL_TABLET | Freq: Two times a day (BID) | ORAL | Status: DC | PRN

## 2013-05-29 NOTE — Progress Notes (Signed)
[facility] MD Progress Note  05/29/2013 1:02 PM Devin Crawford  MRN:  [id number] Subjective:  Patient stated he felt better today because he was not isolating like he did for a year prior to admission.  He had lost his job and became depressed, his partner of six years is supportive.  Attending groups and participating in therapy.  He plans to not isolate when he returns home.  His pelvic pain remains an issue for him. Diagnosis:   DSM5:  Substance/Addictive Disorders:  Opioid Disorder - Severe (304.00) Depressive Disorders:  Major Depressive Disorder - Severe (296.23)  Axis I: Anxiety Disorder NOS, Major Depression, Recurrent severe and Substance Abuse Axis II: Deferred Axis III:  Past Medical History  Diagnosis Date  . HIV (human immunodeficiency virus infection)   . Blood transfusion 2007  . Arthritis   . Kidney calculus   . Narcotic addiction   . Multiple pelvic fractures   . History of tonsillectomy   . hip fracture left     rod placement 04-2006  . History of spleen injury     removal 2007  . Mental disorder   . Depression    Axis IV: other psychosocial or environmental problems, problems related to social environment and problems with primary support group Axis V: 41-50 serious symptoms  ADL's:  Intact  Sleep: Fair  Appetite:  Fair  Suicidal Ideation:  Plan:  vauge Intent:  yes Means:  none Homicidal Ideation:  Denies   Psychiatric Specialty Exam: Review of Systems  Constitutional: Negative.   HENT: Negative.   Eyes: Negative.   Respiratory: Negative.   Cardiovascular: Negative.   Gastrointestinal: Negative.   Genitourinary: Negative.   Musculoskeletal: Negative.   Skin: Negative.   Neurological: Negative.   Endo/Heme/Allergies: Negative.   Psychiatric/Behavioral: Positive for depression. The patient is nervous/anxious.     Blood pressure 131/76, pulse 103, temperature 97.5 F (36.4 C), temperature source Oral, resp. rate 20, height 5' 8.5" (1.74 m),  weight 93.441 kg (206 lb).Body mass index is 30.86 kg/(m^2).  General Appearance: Casual  Eye Contact::  Fair  Speech:  Normal Rate  Volume:  Normal  Mood:  Anxious and Depressed  Affect:  Congruent  Thought Process:  Coherent  Orientation:  Full (Time, Place, and Person)  Thought Content:  WDL  Suicidal Thoughts:  Yes.  with intent/plan  Homicidal Thoughts:  No  Memory:  Immediate;   Fair Recent;   Fair Remote;   Fair  Judgement:  Fair  Insight:  Fair  Psychomotor Activity:  Decreased  Concentration:  Fair  Recall:  Fair  Akathisia:  No  Handed:  Right  AIMS (if indicated):     Assets:  Leisure Time Resilience Social Support  Sleep:  Number of Hours: 5.25   Current Medications: Current Facility-Administered Medications  Medication Dose Route Frequency Provider Last Rate Last Dose  . acetaminophen (TYLENOL) tablet 650 mg  650 mg Oral Q6H PRN Kristeen Mans, NP   650 mg at 05/29/13 0926  . alum & mag hydroxide-simeth (MAALOX/MYLANTA) 200-200-20 MG/5ML suspension 30 mL  30 mL Oral Q4H PRN Kristeen Mans, NP      . buPROPion (WELLBUTRIN XL) 24 hr tablet 150 mg  150 mg Oral Daily Mojeed Akintayo   150 mg at 05/29/13 0811  . cloNIDine (CATAPRES) tablet 0.1 mg  0.1 mg Oral Q8H PRN Mojeed Akintayo      . elvitegravir-cobicistat-emtricitabine-tenofovir (STRIBILD) 150-150-200-300 MG tablet 1 tablet  1 tablet Oral Q breakfast Angelina Ok  Link Snuffer, NP   1 tablet at 05/29/13 [phone]  . fentaNYL (DURAGESIC - dosed mcg/hr) patch 25 mcg  25 mcg Transdermal Q72H Nehemiah Settle, MD   25 mcg at 05/28/13 1247  . gemfibrozil (LOPID) tablet 600 mg  600 mg Oral BID AC Kristeen Mans, NP   600 mg at 05/29/13 0609  . hydrOXYzine (ATARAX/VISTARIL) tablet 25 mg  25 mg Oral Q6H PRN Mojeed Akintayo   25 mg at 05/29/13 1250  . magnesium hydroxide (MILK OF MAGNESIA) suspension 30 mL  30 mL Oral Daily PRN Kristeen Mans, NP      . oxyCODONE (Oxy IR/ROXICODONE) immediate release tablet 5 mg  5 mg Oral Q6H PRN  [place] Cyndie Mull, MD   5 mg at 05/29/13 0816  . pantoprazole (PROTONIX) EC tablet 40 mg  40 mg Oral Daily Kristeen Mans, NP   40 mg at 05/29/13 0811  . sertraline (ZOLOFT) tablet 200 mg  200 mg Oral Daily Kristeen Mans, NP   200 mg at 05/29/13 9604  . traMADol (ULTRAM) tablet 50 mg  50 mg Oral Q6H PRN Kristeen Mans, NP   50 mg at 05/29/13 0620  . traZODone (DESYREL) tablet 50 mg  50 mg Oral QHS PRN Kristeen Mans, NP   50 mg at 05/28/13 2221    Lab Results:  Results for orders placed during the hospital encounter of 05/27/13 (from the past 48 hour(s))  LIPID PANEL     Status: Abnormal   Collection Time    05/28/13  7:40 PM      Result Value Range   Cholesterol 220 (*) 0 - 200 mg/dL   Triglycerides 540  <981 mg/dL   HDL 32 (*) >19 mg/dL   Total CHOL/HDL Ratio 6.9     VLDL 24  0 - 40 mg/dL   LDL Cholesterol 147 (*) 0 - 99 mg/dL   Comment:            Total Cholesterol/HDL:CHD Risk     Coronary Heart Disease Risk Table                         Men   Women      1/2 Average Risk   3.4   3.3      Average Risk       5.0   4.4      2 X Average Risk   9.6   7.1      3 X Average Risk  23.4   11.0                Use the calculated Patient Ratio     above and the CHD Risk Table     to determine the patient's CHD Risk.                ATP III CLASSIFICATION (LDL):      <100     mg/dL   Optimal      829-562  mg/dL   Near or Above                        Optimal      130-159  mg/dL   Borderline      130-865  mg/dL   High      >784     mg/dL   Very High     Performed at [facility]  CBC WITH  DIFFERENTIAL     Status: Abnormal   Collection Time    05/29/13  5:00 AM      Result Value Range   WBC 11.1 (*) 4.0 - 10.5 K/uL   RBC 4.64  4.22 - 5.81 MIL/uL   Hemoglobin 15.1  13.0 - 17.0 g/dL   HCT 16.144.9  09.639.0 - 04.552.0 %   MCV 96.8  78.0 - 100.0 fL   MCH 32.5  26.0 - 34.0 pg   MCHC 33.6  30.0 - 36.0 g/dL   RDW 40.914.4  81.111.5 - 91.415.5 %   Platelets 370  150 - 400 K/uL   Neutrophils Relative %  26 (*) 43 - 77 %   Lymphocytes Relative 63 (*) 12 - 46 %   Monocytes Relative 9  3 - 12 %   Eosinophils Relative 2  0 - 5 %   Basophils Relative 0  0 - 1 %   Neutro Abs 2.9  1.7 - 7.7 K/uL   Lymphs Abs 7.0 (*) 0.7 - 4.0 K/uL   Monocytes Absolute 1.0  0.1 - 1.0 K/uL   Eosinophils Absolute 0.2  0.0 - 0.7 K/uL   Basophils Absolute 0.0  0.0 - 0.1 K/uL   WBC Morphology ABSOLUTE LYMPHOCYTOSIS     Comment: Performed at [facility]    Physical Findings: AIMS: Facial and Oral Movements Muscles of Facial Expression: None, normal Lips and Perioral Area: None, normal Jaw: None, normal Tongue: None, normal,Extremity Movements Upper (arms, wrists, hands, fingers): None, normal Lower (legs, knees, ankles, toes): None, normal, Trunk Movements Neck, shoulders, hips: None, normal, Overall Severity Severity of abnormal movements (highest score from questions above): None, normal Incapacitation due to abnormal movements: None, normal Patient's awareness of abnormal movements (rate only patient's report): No Awareness, Dental Status Current problems with teeth and/or dentures?: No Does patient usually wear dentures?: No  CIWA:    COWS:     Treatment Plan Summary: Daily contact with patient to assess and evaluate symptoms and progress in treatment Medication management  Plan:  Review of chart, vital signs, medications, and notes. 1-Individual and group therapy 2-Medication management for depression and anxiety:  Medications reviewed with the patient and he stated no side effects, no changes made 3-Coping skills for depression, anxiety 4-Continue crisis stabilization and management 5-Address health issues--monitoring vital signs, stable 6-Treatment plan in progress to prevent relapse of depression and anxiety  Medical Decision Making Problem Points:  Established problem, stable/improving (1) and Review of psycho-social stressors (1) Data Points:  Review of medication regiment  & side effects (2)  I certify that inpatient services furnished can reasonably be expected to improve the patient's condition.   Nanine MeansLORD, Francis Doenges, PMH-NP 05/29/2013, 1:02 PM

## 2013-05-29 NOTE — [facility] Group Notes (Signed)
[facility] Group Notes:  (Clinical Social Work)  05/29/2013   3-4PM  Summary of Progress/Problems:  The main focus of today's process group was to   identify the patient's current support system and decide on other supports that can be put in place.  The picture on workbook was used to discuss why additional supports are needed.  An emphasis was placed on using counselor, doctor, therapy groups, 12-step groups, and problem-specific support groups to expand supports.   There was also an extensive discussion about what constitutes a healthy support versus an unhealthy support.  The patient expressed full comprehension of the concepts presented, and agreed that there is a need to add more supports.  The patient reported a willingness to add a counselor.  Type of Therapy:  Process Group  Participation Level:  Active  Participation Quality:  Attentive and Sharing  Affect:  Blunted  Cognitive:  Appropriate and Oriented  Insight:  Engaged  Engagement in Therapy:  Engaged  Modes of Intervention:  Education,  Support and [employer]  Layanna Charo Grossman-Orr, LCSW 05/29/2013, 4:00pm

## 2013-05-29 NOTE — Progress Notes (Signed)
[facility] Group Notes:  (Nursing/MHT/Case Management/Adjunct)  Date:  05/29/2013  Time:  8:59 PM  Type of Therapy:  Group Therapy  Participation Level:  Active  Participation Quality:  Appropriate  Affect:  Appropriate  Cognitive:  Appropriate  Insight:  Appropriate  Engagement in Group:  Engaged  Modes of Intervention:  Discussion  Summary of Progress/Problems:The Pt expressed that in group earlier he learn to not be a people pleaser.  Devin Crawford, Devin Crawford 05/29/2013, 8:59 PM

## 2013-05-29 NOTE — Progress Notes (Signed)
Adult Psychoeducational Group Note  Date:  05/29/2013 Time:  2:24 PM  Group Topic/Focus:  Therapeutic Activity   Participation Level:  Active  Participation Quality:  Appropriate and Attentive  Affect:  Appropriate and Excited  Cognitive:  Appropriate  Insight: Appropriate  Engagement in Group:  Engaged  Modes of Intervention:  Activity  Devin MilesMercer, [name] N 05/29/2013, 2:24 PM

## 2013-05-29 NOTE — Progress Notes (Signed)
[facility] PROGRESS NOTE  Devin Crawford ZOX:[id number] DOB: 1980/12/16 DOA: 05/27/2013 PCP: Devin Kim, PA-C  Assessment/Plan: Dyslipidemia / Hypertriglyceridemia  - recommend resuming home dose of gemfibrozil daily  - fasting lipid panel reviewed, will need to continue meds, diet, recheck with PCP  Chronic Pain Syndrome  - Recommend resuming fentanyl patch 25 mcg every 72 hours, oxycodone 5 mg prn breakthru pain  - Pt reports that pain is controlled now - recommend a bowel regimen as needed for constipation   Depression / Anxiety Disorder  - Management per behavioral health team   HIV  - Recommend resuming home Stribild daily   Leukocytosis  - no active signs of infection  - repeat CBC shows WBC trending down   Tobacco User  The patient was counseled on the dangers of tobacco use, and was advised to quit. Reviewed strategies to maximize success, including removing cigarettes and smoking materials from environment and stress management.   Thank you for the consult, we will sign off. Please contact [facility] if we can be of further assistance.   HPI/Subjective: Pt reports that pain is much better controlled and manageable now  Objective: Filed Vitals:   05/29/13 0701  BP: 131/76  Pulse: 103  Temp:   Resp:    No intake or output data in the 24 hours ending 05/29/13 1356 Filed Weights   05/28/13 0015  Weight: 206 lb (93.441 kg)   Exam:   General:  Awake, alert, no apparent distress  Cardiovascular: normal s1, s2 sounds   Respiratory: BBS clear to auscultation   Abdomen: soft, nondistended, nontender  Musculoskeletal: hip pain with palpation   Data Reviewed: Basic Metabolic Panel:  Recent Labs Lab 05/27/13 1801  NA 145  K 4.0  CL 107  CO2 20  GLUCOSE 91  BUN 14  CREATININE 1.04  CALCIUM 9.7   Liver Function Tests:  Recent Labs Lab 05/27/13 1801  AST 19  ALT 19  ALKPHOS 155*  BILITOT 0.4  PROT 7.9  ALBUMIN 4.9   No  results found for this basename: LIPASE, AMYLASE,  in the last 168 hours No results found for this basename: AMMONIA,  in the last 168 hours CBC:  Recent Labs Lab 05/27/13 1801 05/29/13 0500  WBC 13.4* 11.1*  NEUTROABS 7.8* 2.9  HGB 15.9 15.1  HCT 45.3 44.9  MCV 94.4 96.8  PLT 358 370   Cardiac Enzymes: No results found for this basename: CKTOTAL, CKMB, CKMBINDEX, TROPONINI,  in the last 168 hours BNP (last 3 results) No results found for this basename: PROBNP,  in the last 8760 hours CBG: No results found for this basename: GLUCAP,  in the last 168 hours  No results found for this or any previous visit (from the past 240 hour(s)).   Studies: No results found.  Scheduled Meds: . buPROPion  150 mg Oral Daily  . elvitegravir-cobicistat-emtricitabine-tenofovir  1 tablet Oral Q breakfast  . fentaNYL  25 mcg Transdermal Q72H  . gemfibrozil  600 mg Oral BID AC  . pantoprazole  40 mg Oral Daily  . sertraline  200 mg Oral Daily   Continuous Infusions:   Principal Problem:   Severe recurrent major depression without psychotic features Active Problems:   Human immunodeficiency virus (HIV) disease   Anxiety   Dyslipidemia   Hypertriglyceridemia   Polysubstance (including opioids) dependence without physiological dependence   Chronic pain syndrome   Devin Crawford Pager [phone]. If 7PM-7AM, please contact night-coverage at [email or web address], password [facility]  05/29/2013, 1:56 PM  LOS: 2 days

## 2013-05-29 NOTE — Progress Notes (Signed)
Seen and agreed. Jasmarie Coppock, MD 

## 2013-05-29 NOTE — Progress Notes (Signed)
Psychoeducational Group Note  Date:  05/29/2013 Time: 1015 Group Topic/Focus:  Making Healthy Choices:   The focus of this group is to help patients identify negative/unhealthy choices they were using prior to admission and identify positive/healthier coping strategies to replace them upon discharge.  Participation Level:  Active  Participation Quality:  Attentive  Affect:  Anxious  Cognitive:  Alert  Insight:  Engaged  Engagement in Group:  Engaged  Additional Comments:    Rich Braveuke, Lamoyne Hessel Lynn 1:06 PM. 05/29/2013

## 2013-05-29 NOTE — Progress Notes (Addendum)
Devin Crawford remains quite focused on medications and somatic complaints. While he is pleasant and cooperative, he is anxious, requests meds about every 2 hrs and has a low tolerance for any discomfort.   [name] attends his groups and is engaged in his poc. He completed his self inventory and on it he wrote he denied SI, he rated his depression and hopelessness "3/1" and he said his DC plan is to " be more active and not dwell on the past".   R Safety is in place and poc cont.

## 2013-05-30 LAB — PATHOLOGIST SMEAR REVIEW

## 2013-05-30 MED ORDER — TRAZODONE HCL 100 MG PO TABS
100.0000 mg | ORAL_TABLET | Freq: Every evening | ORAL | Status: DC | PRN
  Administered 2013-05-30: 100 mg via ORAL
  Filled 2013-05-30: qty 1
  Filled 2013-05-30: qty 14

## 2013-05-30 NOTE — Progress Notes (Signed)
Recreation Therapy Notes  Date: 01.12.2015 Time: 3:00pm Location: 500 Hall Dayroom  Group Topic: Wellness  Goal Area(s) Addresses:  Patient will identify healthy investments in dimensions of wellness.  Patient will identify importance of balance in all dimensions of wellness.   Behavioral Response: Appropriate   Intervention: Group Activity  Activity: Wellness Dimensions. As a group patients were asked to identify healthy ways to invest in 6 dimensions of wellness: Physical, Social, Intellectual, Emotional, Environmental, Spiritual.   Education: Wellness, Discharge Planning, Coping Skills   Education Outcome: Acknowledges Education   Clinical Observations/Feedback: Patient actively engaged in group activity, offering suggestions for group list. Patient additionally contributed to group discussion identifying positive change possible by investing in dimensions of wellness. Patient spoke to importance of balance in his wellness as well.    Marykay Lexenise L Harsh Trulock, LRT/CTRS  Jearl KlinefelterBlanchfield, Devin Crawford L 05/30/2013 4:52 PM

## 2013-05-30 NOTE — Progress Notes (Signed)
D: Patient in the hallway on approach.  Patient states he had a good day.  Patient states he realizes that his life is not that bad.  Patient state she had a great day because he woke up today.  Patient explains that he enjoys his life and many people do not get the luxury of waking up.  Patient states when he is discharged he is going to go find a job and he thinks this may help his from staying in the house all day and being depressed.  Patient states his partner is supportive and states his partner is encouraging.  Patient denies SI/HI and denies AVH. A: Staff to monitor Q 15 mins for safety.  Encouragement and support offered.  Scheduled medications administered per orders. Oxycodone administered for left hip pain. R: Patient remains safe on the unit.  Patient attended group tonight.  Patient visible on the unit and interacting with peers.  Patient taking administered medications.  Patient pain relieved.

## 2013-05-30 NOTE — Tx Team (Signed)
Interdisciplinary Treatment Plan Update (Adult)  Date: 05/30/2013  Time Reviewed:  9:45 AM  Progress in Treatment: Attending groups: Yes Participating in groups:  Yes Taking medication as prescribed:  Yes Tolerating medication:  Yes Family/Significant othe contact made: CSW assessing  Patient understands diagnosis:  Yes Discussing patient identified problems/goals with staff:  Yes Medical problems stabilized or resolved:  Yes Denies suicidal/homicidal ideation: Yes Issues/concerns per patient self-inventory:  Yes Other:  New problem(s) identified: N/A  Discharge Plan or Barriers: CSW assessing for appropriate referrals.  Reason for Continuation of Hospitalization: Anxiety Depression Medication Stabilization  Comments: N/A  Estimated length of stay: 3-5 days  For review of initial/current patient goals, please see plan of care.  Attendees: Patient:     Family:     Physician:  Dr. Johnalagadda 05/30/2013 10:20 AM   Nursing:   Christa Dobson, RN 05/30/2013 10:20 AM   Clinical Social Worker:  Torey Regan Horton, LCSW 05/30/2013 10:20 AM   Other: Conrad Withrow, PA 05/30/2013 10:20 AM   Other:  Valerie Noch, care coordination 05/30/2013 10:20 AM   Other:  Quylle Hodnett, LCSW 05/30/2013 10:20 AM   Other:  Carol Davis, RN 05/30/2013 10:21 AM   Other: Beverly Knight, RN 05/30/2013 10:21 AM   Other:    Other:    Other:    Other:    Other:     Scribe for Treatment Team:   Horton, Krystianna Soth Nicole, 05/30/2013 10:20 AM    

## 2013-05-30 NOTE — Progress Notes (Signed)
Adult Psychoeducational Group Note  Date:  05/30/2013 Time:  9:26 PM  Group Topic/Focus:  Wrap-Up Group:   The focus of this group is to help patients review their daily goal of treatment and discuss progress on daily workbooks.  Participation Level:  Active  Participation Quality:  Appropriate  Affect:  Appropriate  Cognitive:  Appropriate  Insight: Appropriate  Engagement in Group:  Engaged and Supportive  Modes of Intervention:  Support  Additional Comments:     Pt stated that he learned a lot just by watching and observing today. He says that he learned that ppl deal with things in different ways and that when he compares it to others, that his life is not as bas as he thought it was. Pt was given support.   Alessander Sikorski 05/30/2013, 9:26 PM

## 2013-05-30 NOTE — [facility] Group Notes (Signed)
[facility]   05/30/2013 8:45 AM  Participation Quality:  Alert, Appropriate and Oriented  Mood/Affect:  Calm  Depression Rating:  0  Anxiety Rating:  2  Thoughts of Suicide:  Pt denies SI/HI  Will you contract for safety?   Yes  Current AVH:  Pt denies  Plan for Discharge/Comments:  Pt attended discharge planning group and actively participated in group.  CSW provided pt with today's workbook.  Pt reports a plan to volunteer and go to support groups when he returns home.  [place] return [place] in [place] and does not have any follow up.  CSW will assess for appropriate referrals and provide pt with support groups in [place], such as [facility].  No further needs voiced by pt at this time.    Transportation Means: Pt reports access to transportation - reports partner will pick him up  Supports: No supports mentioned at this time  Reyes IvanChelsea Horton, LCSW 05/30/2013 9:53 AM

## 2013-05-30 NOTE — Progress Notes (Signed)
Patient has been up and active on the unit, attended group this evening and participated.  Patient has been up in the dayroom laughing and  talking with select peers.Patient currently denies having pain, -si/hi/a/v hall. Support and encouragement offered, safety maintained on unit, will continue to monitor.

## 2013-05-30 NOTE — Progress Notes (Signed)
Recreation Therapy Notes  INPATIENT RECREATION THERAPY ASSESSMENT  Patient Stressors: Family, Death,  Work, Other: HIV status  Coping Skills: Exercise, Talking, Music,   Leisure Interests: AnimatorComputer (social media), Exercise, Listening to Music, Movies, Reading, Travel, Walking,   Personal Challenges:  Social Interaction, Stress Management,   Patient indicated he does have a physical disability that would prevent participating in recreation therapy group sessions. Patient indicated he has screws and rods in left pelvis  Patient listed the following strengths: Listening, Kindness  Patient indicated he would like to change the following about himself: My depression and HIV status  Patient listed the following current recreation interests: [place], Photography        Patient goal for hospitalization: "To be able to manage my depression which I am being able to do now due to medication and talking to others."  Patient city [place] county [place] residence: [place], [place].   Marykay Lexenise L Charde Macfarlane, LRT/CTRS  Jearl KlinefelterBlanchfield, Demesha Boorman L 05/30/2013 4:53 PM

## 2013-05-30 NOTE — [facility] Group Notes (Signed)
[facility] LCSW Group Therapy  05/30/2013  1:15 PM   Type of Therapy:  Group Therapy  Participation Level:  Active  Participation Quality:  Attentive, Sharing and Supportive  Affect:  Calm  Cognitive:  Alert and Oriented  Insight:  Developing/Improving and Engaged  Engagement in Therapy:  Developing/Improving and Engaged  Modes of Intervention:  Clarification, Confrontation, Discussion, Education, Exploration, Limit-setting, Orientation, Problem-solving, Rapport Building, Dance movement psychotherapisteality Testing, Socialization and Support  Summary of Progress/Problems: Pt identified obstacles faced currently and processed barriers involved in overcoming these obstacles. Pt identified steps necessary for overcoming these obstacles and explored motivation (internal and external) for facing these difficulties head on. Pt further identified one area of concern in their lives and chose a goal to focus on for today.  Pt shared that his biggest obstacle was himself, in that he was dealing with depression for a year but felt he could get himself out of the depression on his own.  Pt states that he told his mom he needed help and then decided to come here.  Pt states that coming here has helped him immensely through support and getting on a medication.  Pt actively participated and was engaged in group discussion.    Devin IvanChelsea Horton, LCSW 05/30/2013 2:42 PM

## 2013-05-30 NOTE — Progress Notes (Signed)
D:  Patient's self inventory sheet, patient has fair sleep, needs sleep medication, good appetite, normal energy level, good attention span.  Rated depression 1, anxiety 3, denied hopeless.  Denied withdrawals.  Denied SI.  Has experienced pain in past 24 hours, manages his pain.  Worst pain 5-6.  After discharge, plans not to isolate himself, possibly joining a group/start volunteering.  No problems with medications after discharge.   A:  Medications administered per MD orders.  Emotional support and encouragement given patient. R:  Denied SI and HI.  Denied A/V hallucinations.  Will continue to monitor patient for safety with 15 minute checks.  Safety maintained.

## 2013-05-30 NOTE — Progress Notes (Signed)
Patient ID: Devin Crawford, male   DOB: [birth year], 33 y.o.   MRN: [id number] [facility] MD Progress Note  05/30/2013 8:22 AM Devin Crawford  MRN:  [id number] Subjective:  Patient stated he felt better today because he was not isolating like he did for a year prior to admission.  He had lost his job and became depressed, his partner of six years is supportive.  Attending groups and participating in therapy.  He plans to not isolate when he returns home.  His pelvic pain remains an issue for him.    As of 05/30/2013, pt denies SI, HI, and AVH. Pt brings up the point that he is HIV+, but also affirms that he is compliant at home with his medications and that his ID physician is following him closely, reporting that he is in good health at this time and that his treatment is stable. Pt reports improvement in mood over yesterday rating anxiety at 3/10 and depression at 1/10. Agreeable with current tx and medication management with the exception of desiring stronger tx for insomnia (addressed).   Diagnosis:   DSM5:  Substance/Addictive Disorders:  Opioid Disorder - Severe (304.00) Depressive Disorders:  Major Depressive Disorder - Severe (296.23)  Axis I: Anxiety Disorder NOS, Major Depression, Recurrent severe and Substance Abuse Axis II: Deferred Axis III:  Past Medical History  Diagnosis Date  . HIV (human immunodeficiency virus infection)   . Blood transfusion 2007  . Arthritis   . Kidney calculus   . Narcotic addiction   . Multiple pelvic fractures   . History of tonsillectomy   . hip fracture left     rod placement 04-2006  . History of spleen injury     removal 2007  . Mental disorder   . Depression    Axis IV: other psychosocial or environmental problems, problems related to social environment and problems with primary support group Axis V: 51-60 moderate symptoms  ADL's:  Intact  Sleep: Fair  Appetite:  Fair  Suicidal Ideation:  Plan:  vauge Intent:  yes Means:   none Homicidal Ideation:  Denies   Psychiatric Specialty Exam: Review of Systems  Constitutional: Negative.   HENT: Negative.   Eyes: Negative.   Respiratory: Negative.   Cardiovascular: Negative.   Gastrointestinal: Negative.   Genitourinary: Negative.   Musculoskeletal: Negative.   Skin: Negative.   Neurological: Negative.   Endo/Heme/Allergies: Negative.   Psychiatric/Behavioral: Positive for depression. The patient is nervous/anxious.     Blood pressure 117/74, pulse 96, temperature 98.2 F (36.8 C), temperature source Oral, resp. rate 20, height 5' 8.5" (1.74 m), weight 93.441 kg (206 lb).Body mass index is 30.86 kg/(m^2).  General Appearance: Casual  Eye Contact::  Good  Speech:  Normal Rate  Volume:  Normal  Mood:  Anxious  Affect:  Appropriate  Thought Process:  Coherent  Orientation:  Full (Time, Place, and Person)  Thought Content:  WDL  Suicidal Thoughts:  No  Homicidal Thoughts:  No  Memory:  Immediate;   Good Recent;   Good Remote;   Good  Judgement:  Fair  Insight:  Fair  Psychomotor Activity:  Normal  Concentration:  Fair  Recall:  Fair  Akathisia:  No  Handed:  Right  AIMS (if indicated):     Assets:  Leisure Time Resilience Social Support  Sleep:  Number of Hours: 5.75   Current Medications: Current Facility-Administered Medications  Medication Dose Route Frequency Provider Last Rate Last Dose  . acetaminophen (TYLENOL) tablet 650 mg  650 mg Oral Q6H PRN Kristeen Mans, NP   650 mg at 05/29/13 1610  . alum & mag hydroxide-simeth (MAALOX/MYLANTA) 200-200-20 MG/5ML suspension 30 mL  30 mL Oral Q4H PRN Kristeen Mans, NP      . buPROPion (WELLBUTRIN XL) 24 hr tablet 150 mg  150 mg Oral Daily Mojeed Akintayo   150 mg at 05/30/13 0811  . cloNIDine (CATAPRES) tablet 0.1 mg  0.1 mg Oral Q8H PRN Mojeed Akintayo      . elvitegravir-cobicistat-emtricitabine-tenofovir (STRIBILD) 150-150-200-300 MG tablet 1 tablet  1 tablet Oral Q breakfast Kristeen Mans, NP    1 tablet at 05/30/13 [phone]  . fentaNYL (DURAGESIC - dosed mcg/hr) patch 25 mcg  25 mcg Transdermal Q72H Nehemiah Settle, MD   25 mcg at 05/28/13 1247  . gemfibrozil (LOPID) tablet 600 mg  600 mg Oral BID AC Kristeen Mans, NP   600 mg at 05/30/13 5409  . hydrOXYzine (ATARAX/VISTARIL) tablet 25 mg  25 mg Oral Q6H PRN Mojeed Akintayo   25 mg at 05/29/13 1830  . magnesium hydroxide (MILK OF MAGNESIA) suspension 30 mL  30 mL Oral Daily PRN Kristeen Mans, NP      . oxyCODONE (Oxy IR/ROXICODONE) immediate release tablet 5 mg  5 mg Oral Q6H PRN Clanford Cyndie Mull, MD   5 mg at 05/30/13 0815  . pantoprazole (PROTONIX) EC tablet 40 mg  40 mg Oral Daily Kristeen Mans, NP   40 mg at 05/30/13 8119  . senna-docusate (Senokot-S) tablet 1-2 tablet  1-2 tablet Oral BID PRN Clanford Cyndie Mull, MD      . sertraline (ZOLOFT) tablet 200 mg  200 mg Oral Daily Kristeen Mans, NP   200 mg at 05/30/13 1478  . traMADol (ULTRAM) tablet 50 mg  50 mg Oral Q6H PRN Kristeen Mans, NP   50 mg at 05/30/13 0607  . traZODone (DESYREL) tablet 50 mg  50 mg Oral QHS PRN Kristeen Mans, NP   50 mg at 05/29/13 2123    Lab Results:  Results for orders placed during the hospital encounter of 05/27/13 (from the past 48 hour(s))  LIPID PANEL     Status: Abnormal   Collection Time    05/28/13  7:40 PM      Result Value Range   Cholesterol 220 (*) 0 - 200 mg/dL   Triglycerides 295  <621 mg/dL   HDL 32 (*) >30 mg/dL   Total CHOL/HDL Ratio 6.9     VLDL 24  0 - 40 mg/dL   LDL Cholesterol 865 (*) 0 - 99 mg/dL   Comment:            Total Cholesterol/HDL:CHD Risk     Coronary Heart Disease Risk Table                         Men   Women      1/2 Average Risk   3.4   3.3      Average Risk       5.0   4.4      2 X Average Risk   9.6   7.1      3 X Average Risk  23.4   11.0                Use the calculated Patient Ratio     above and the CHD Risk Table     to determine  the patient's CHD Risk.                ATP III  CLASSIFICATION (LDL):      <100     mg/dL   Optimal      161-096  mg/dL   Near or Above                        Optimal      130-159  mg/dL   Borderline      045-409  mg/dL   High      >811     mg/dL   Very High     Performed at [facility]  CBC WITH DIFFERENTIAL     Status: Abnormal   Collection Time    05/29/13  5:00 AM      Result Value Range   WBC 11.1 (*) 4.0 - 10.5 K/uL   RBC 4.64  4.22 - 5.81 MIL/uL   Hemoglobin 15.1  13.0 - 17.0 g/dL   HCT 91.4  78.2 - 95.6 %   MCV 96.8  78.0 - 100.0 fL   MCH 32.5  26.0 - 34.0 pg   MCHC 33.6  30.0 - 36.0 g/dL   RDW 21.3  08.6 - 57.8 %   Platelets 370  150 - 400 K/uL   Neutrophils Relative % 26 (*) 43 - 77 %   Lymphocytes Relative 63 (*) 12 - 46 %   Monocytes Relative 9  3 - 12 %   Eosinophils Relative 2  0 - 5 %   Basophils Relative 0  0 - 1 %   Neutro Abs 2.9  1.7 - 7.7 K/uL   Lymphs Abs 7.0 (*) 0.7 - 4.0 K/uL   Monocytes Absolute 1.0  0.1 - 1.0 K/uL   Eosinophils Absolute 0.2  0.0 - 0.7 K/uL   Basophils Absolute 0.0  0.0 - 0.1 K/uL   WBC Morphology ABSOLUTE LYMPHOCYTOSIS     Comment: Performed at [facility]    Physical Findings: AIMS: Facial and Oral Movements Muscles of Facial Expression: None, normal Lips and Perioral Area: None, normal Jaw: None, normal Tongue: None, normal,Extremity Movements Upper (arms, wrists, hands, fingers): None, normal Lower (legs, knees, ankles, toes): None, normal, Trunk Movements Neck, shoulders, hips: None, normal, Overall Severity Severity of abnormal movements (highest score from questions above): None, normal Incapacitation due to abnormal movements: None, normal Patient's awareness of abnormal movements (rate only patient's report): No Awareness, Dental Status Current problems with teeth and/or dentures?: No Does patient usually wear dentures?: No  CIWA:    COWS:     Treatment Plan Summary: Daily contact with patient to assess and evaluate symptoms and  progress in treatment Medication management  Plan:  Review of chart, vital signs, medications, and notes. 1-Individual and group therapy 2-Medication management for depression and anxiety:  Medications reviewed with the patient and he stated no side effects. Trazodone increased to 100mg  qhs for sleep.  3-Coping skills for depression, anxiety 4-Continue crisis stabilization and management 5-Address health issues--monitoring vital signs, stable 6-Treatment plan in progress to prevent relapse of depression and anxiety  Medical Decision Making Problem Points:  Established problem, stable/improving (1) and Review of psycho-social stressors (1) Data Points:  Review of medication regiment & side effects (2)  I certify that inpatient services furnished can reasonably be expected to improve the patient's condition.   Beau Fanny, FNP-BC 05/30/2013, 8:22 AM  Reviewed the information documented  and agree with the treatment plan.  Marybell Robards,JANARDHAHA R. 05/31/2013 1:58 PM

## 2013-05-31 ENCOUNTER — Ambulatory Visit: Payer: Self-pay | Admitting: *Deleted

## 2013-05-31 MED ORDER — BUPROPION HCL ER (XL) 150 MG PO TB24: 150.0000 mg | ORAL_TABLET | Freq: Every day | ORAL | Status: DC

## 2013-05-31 MED ORDER — SERTRALINE HCL 100 MG PO TABS: 200.0000 mg | ORAL_TABLET | Freq: Every day | ORAL | Status: DC

## 2013-05-31 MED ORDER — TRAZODONE HCL 100 MG PO TABS: 100.0000 mg | ORAL_TABLET | Freq: Every evening | ORAL | Status: DC | PRN
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
## 2013-05-31 NOTE — Progress Notes (Signed)
Discharge Note:  Patient discharged home with friend.  Denied SI and HI.  Denied A/V hallucinations.  Denied pain.  Suicide prevention information given and discussed with patient who stated he understood and had no questions.  Patient received all his belongings, clothing, miscellaneous items, toiletries, prescriptions, medications, shoestrings, keys, handbag, glasses, cards, cellphone, jewelry.   Patient stated he [facility].

## 2013-05-31 NOTE — Progress Notes (Signed)
[facility] :  Will you be returning to the same living situation after discharge: Yes,  returning home At discharge, do you have transportation home?:Yes,  partner will pick pt up today Do you have the ability to pay for your medications:Yes,  access to meds  Release of information consent forms completed and in the chart;  Patient's signature needed at discharge.  Patient to Follow up at: Follow-up Information   Follow up with [facility] On 06/02/2013. (Walk in for hospital discharge appointment, for outpatient medication management and therapy. Walk in clinic is Monday - Friday 8 am - 3 pm)    Contact information:   [address] [place], [place] [address] Phone: [phone] Fax: [phone]      Follow up with [facility] On 06/07/2013. (Appointment scheduled at 11:00 am with Rudi RummageSharon Burkett for therapy)    Contact information:   [address] [place], [place] [address] Phone: [phone] Fax: [phone]      Patient denies SI/HI:   Yes,  denies SI/HI    Safety Planning and Suicide Prevention discussed:  Yes,  discussed with pt and pt's mother.  See suicide prevention education note.   Devin MillerHorton, Devin Crawford 05/31/2013, 10:09 AM

## 2013-05-31 NOTE — Progress Notes (Signed)
The focus of this group is to educate the patient on the purpose and policies of crisis stabilization and provide a format to answer questions about their admission.  The group details unit policies and expectations of patients while admitted.  Patient attended 0900 nurse education orientation group this morning.  Patient actively participated, appropriate affect, alert, appropriate insight and engagement.  Today patient will work on 3 goals for discharge.  

## 2013-05-31 NOTE — [facility] Group Notes (Signed)
[facility] LCSW Group Therapy  05/31/2013  1:15 PM   Type of Therapy:  Group Therapy  Participation Level:  Active  Participation Quality:  Attentive, Sharing and Supportive  Affect:  Calm  Cognitive:  Alert and Oriented  Insight:  Developing/Improving and Engaged  Engagement in Therapy:  Developing/Improving and Engaged  Modes of Intervention:  Activity, Clarification, Confrontation, Discussion, Education, Exploration, Limit-setting, Orientation, Problem-solving, Rapport Building, Dance movement psychotherapisteality Testing, Socialization and Support  Summary of Progress/Problems: Patient was attentive and engaged with speaker from [facility].  Patient was attentive to speaker while they shared their story of dealing with mental health and overcoming it.  Patient expressed interest in their programs and services and received information on their agency.  Patient processed ways they can relate to the speaker.     Reyes IvanChelsea Horton, LCSW 05/31/2013 1:22 PM

## 2013-05-31 NOTE — Discharge Summary (Signed)
Physician Discharge Summary Note  Patient:  Devin Crawford is an 33 y.o., male MRN:  [id number] DOB:  Jun 25, 1980 Patient phone:  [phone] (home)  Patient address:   [address] [place] [address],   Date of Admission:  05/27/2013 Date of Discharge: 05/31/2013  Reason for Admission:  Major depression, recurrent, severe; Suicidal ideation w/plan   Discharge Diagnoses: Principal Problem:   Severe recurrent major depression without psychotic features Active Problems:   Human immunodeficiency virus (HIV) disease   Anxiety   Dyslipidemia   Hypertriglyceridemia   Polysubstance (including opioids) dependence without physiological dependence   Chronic pain syndrome  Review of Systems  Constitutional: Negative.   HENT: Negative.   Eyes: Negative.   Respiratory: Negative.   Cardiovascular: Negative.   Gastrointestinal: Negative.   Genitourinary: Negative.   Musculoskeletal: Negative.   Skin: Negative.   Neurological: Negative.   Endo/Heme/Allergies: Negative.   Psychiatric/Behavioral: Positive for depression. Negative for suicidal ideas and hallucinations. The patient is nervous/anxious and has insomnia.    DSM5: Trauma-Stressor Disorders:  Posttraumatic Stress Disorder (309.81) Substance/Addictive Disorders:  N/A Depressive Disorders:  Major Depressive Disorder - Severe (296.23)  Axis Diagnosis:   AXIS I:  Anxiety Disorder NOS, Major Depression, Recurrent severe and Post Traumatic Stress Disorder AXIS II:  Deferred AXIS III:   Past Medical History  Diagnosis Date  . HIV (human immunodeficiency virus infection)   . Blood transfusion 2007  . Arthritis   . Kidney calculus   . Narcotic addiction   . Multiple pelvic fractures   . History of tonsillectomy   . hip fracture left     rod placement 04-2006  . History of spleen injury     removal 2007  . Mental disorder   . Depression    AXIS IV:  other psychosocial or environmental problems and  problems related to social environment AXIS V:  61-70 mild symptoms  Level of Care:  OP  Hospital Course:   Devin Crawford is an 33 y.o. male, Caucasian presents to [facility] ED after being referred by [facility] for medical clearance and assessment. Pt reports he has a history of depression and anxiety and has been receiving treatment since age 39. He presents today with complaint of suicidal ideation, today he saw a cord and seriously considered attaching it to a hook in his home and hanging himself. Pt states he has been increasingly depressed with symptoms including crying spells, poor sleep, social withdrawal, irritability, loss of interest in usual pleasures and feelings of sadness, guilt and hopelessness. He reports averaging four hours of sleep. Pt denies homicidal ideation or history of violence. Pt denies any psychotic symptoms. Pt reports multiple stressors. He states he lost his job approximately one year ago and trying to find another has been discouraging. His partner's grandmother died in 2014-08-07and this was very upsetting because he saw her daily. He states he was diagnosed with HIV in 2005 and he still finds it difficult to deal with or talk about.  During Hospitalization: Medications managed, psychoeducation, group and individual therapy. Pt currently denies SI, HI, and Psychosis. At discharge, pt rates anxiety at 1/10 and depression at 1/10. Pt states that he does have a good supportive home environment and will followup with outpatient treatment. Affirms agreement with medication regimen and discharge plan. Denies other physical and psychological concerns at time of discharge.   Consults:  None  Significant Diagnostic Studies:  None  Discharge Vitals:   Blood pressure  130/69, pulse 16, temperature 98 F (36.7 C), temperature source Oral, resp. rate 91, height 5' 8.5" (1.74 m), weight 93.441 kg (206 lb). Body mass index is 30.86 kg/(m^2). Lab Results:   Results for orders  placed during the hospital encounter of 05/27/13 (from the past 72 hour(s))  LIPID PANEL     Status: Abnormal   Collection Time    05/28/13  7:40 PM      Result Value Range   Cholesterol 220 (*) 0 - 200 mg/dL   Triglycerides 161119  <096<150 mg/dL   HDL 32 (*) >04>39 mg/dL   Total CHOL/HDL Ratio 6.9     VLDL 24  0 - 40 mg/dL   LDL Cholesterol 540164 (*) 0 - 99 mg/dL   Comment:            Total Cholesterol/HDL:CHD Risk     Coronary Heart Disease Risk Table                         Men   Women      1/2 Average Risk   3.4   3.3      Average Risk       5.0   4.4      2 X Average Risk   9.6   7.1      3 X Average Risk  23.4   11.0                Use the calculated Patient Ratio     above and the CHD Risk Table     to determine the patient's CHD Risk.                ATP III CLASSIFICATION (LDL):      <100     mg/dL   Optimal      [phone]  mg/dL   Near or Above                        Optimal      130-159  mg/dL   Borderline      [id number]  mg/dL   High      >621>190     mg/dL   Very High     [facility]  CBC WITH DIFFERENTIAL     Status: Abnormal   Collection Time    05/29/13  5:00 AM      Result Value Range   WBC 11.1 (*) 4.0 - 10.5 K/uL   RBC 4.64  4.22 - 5.81 MIL/uL   Hemoglobin 15.1  13.0 - 17.0 g/dL   HCT 30.844.9  65.739.0 - 84.652.0 %   MCV 96.8  78.0 - 100.0 fL   MCH 32.5  26.0 - 34.0 pg   MCHC 33.6  30.0 - 36.0 g/dL   RDW 96.214.4  95.211.5 - 84.115.5 %   Platelets 370  150 - 400 K/uL   Neutrophils Relative % 26 (*) 43 - 77 %   Lymphocytes Relative 63 (*) 12 - 46 %   Monocytes Relative 9  3 - 12 %   Eosinophils Relative 2  0 - 5 %   Basophils Relative 0  0 - 1 %   Neutro Abs 2.9  1.7 - 7.7 K/uL   Lymphs Abs 7.0 (*) 0.7 - 4.0 K/uL   Monocytes Absolute 1.0  0.1 - 1.0 K/uL   Eosinophils Absolute 0.2  0.0 - 0.7 K/uL  Basophils Absolute 0.0  0.0 - 0.1 K/uL   WBC Morphology ABSOLUTE LYMPHOCYTOSIS     Comment: Performed at [facility]  PATHOLOGIST SMEAR REVIEW      Status: None   Collection Time    05/29/13  5:00 AM      Result Value Range   Path Review Reviewed By Havery Moros, M.D.     Comment: 01.12.15                Absolute lymphocytosis. Suggest     immunophenotyping if a new,     persistent finding.                Performed at [facility]    Physical Findings: AIMS: Facial and Oral Movements Muscles of Facial Expression: None, normal Lips and Perioral Area: None, normal Jaw: None, normal Tongue: None, normal,Extremity Movements Upper (arms, wrists, hands, fingers): None, normal Lower (legs, knees, ankles, toes): None, normal, Trunk Movements Neck, shoulders, hips: None, normal, Overall Severity Severity of abnormal movements (highest score from questions above): None, normal Incapacitation due to abnormal movements: None, normal Patient's awareness of abnormal movements (rate only patient's report): No Awareness, Dental Status Current problems with teeth and/or dentures?: No Does patient usually wear dentures?: No  CIWA:  CIWA-Ar Total: 1 COWS:  COWS Total Score: 2  Psychiatric Specialty Exam: See Psychiatric Specialty Exam and Suicide Risk Assessment completed by Attending Physician prior to discharge.  Discharge destination:  Home  Is patient on multiple antipsychotic therapies at discharge:  No   Has Patient had three or more failed trials of antipsychotic monotherapy by history:  No  Recommended Plan for Multiple Antipsychotic Therapies: NA   Future Appointments Provider Department Dept Phone   05/31/2013 11:00 AM Max Caralyn Guile [facility] for [facility] [phone]   06/23/2013 9:15 AM Judyann Munson, MD [facility] for Infectious Disease [phone]       Medication List    ASK your doctor about these medications     Indication   acetaminophen 500 MG tablet  Commonly known as:  TYLENOL  Take 500 mg by mouth every 6 (six) hours as needed. For pain       ALPRAZolam 1 MG tablet  Commonly known as:  XANAX  Take 1 mg by mouth every 4 (four) hours as needed. For anxiety      elvitegravir-cobicistat-emtricitabine-tenofovir 150-150-200-300 MG Tabs tablet  Commonly known as:  STRIBILD  Take 1 tablet by mouth daily with breakfast.      fentaNYL 25 MCG/HR patch  Commonly known as:  DURAGESIC - dosed mcg/hr  Place 25 mcg onto the skin every other day.      gemfibrozil 600 MG tablet  Commonly known as:  LOPID  Take 1 tablet (600 mg total) by mouth 2 (two) times daily before a meal.      multivitamin with minerals Tabs tablet  Take 1 tablet by mouth daily.      omeprazole 40 MG capsule  Commonly known as:  PRILOSEC  Take 1 capsule (40 mg total) by mouth daily.      oxyCODONE 5 MG immediate release tablet  Commonly known as:  Oxy IR/ROXICODONE  Take 5 mg by mouth every 6 (six) hours as needed for severe pain.      polyethylene glycol packet  Commonly known as:  MIRALAX / GLYCOLAX  Take 17 g by mouth daily.      sertraline 100 MG  tablet  Commonly known as:  ZOLOFT  Take 200 mg by mouth daily.      traMADol 50 MG tablet  Commonly known as:  ULTRAM  Take 50 mg by mouth every 6 (six) hours as needed for pain.            Follow-up Information   Follow up with [facility] On 06/02/2013. (Walk in for hospital discharge appointment, for outpatient medication management and therapy. Walk in clinic is Monday - Friday 8 am - 3 pm)    Contact information:   [address] [place] [address] Phone: [phone] Fax: [phone]      [facility] with [facility] On 06/07/2013. (Appointment scheduled at 11:00 am with Rudi Rummage for therapy)    Contact information:   [address], [place] [address] Phone: [phone] Fax: [phone]      Follow-up recommendations:  Activity:  As tolerated Diet:  Heart healthy with low sodium.  Comments:   Take all medications as prescribed. Keep all follow-up appointments  as scheduled.  Do not consume alcohol or use illegal drugs while on prescription medications. Report any adverse effects from your medications to your primary care provider promptly.  In the event of recurrent symptoms or worsening symptoms, call 911, a crisis hotline, or go to the nearest emergency department for evaluation.   Total Discharge Time:  Greater than 30 minutes.  Signed: Beau Fanny, FNP-BC 05/31/2013, 10:12 AM  Patient was seen face-to-face psychiatric evaluation, suicide risk assessment and formulated treatment plan. This case has been discussed with physician assistant and the treatment team and made appropriate disposition plans. Reviewed the information documented and agree with the treatment plan.  Allene Furuya,JANARDHAHA R. 05/31/2013 6:07 PM

## 2013-05-31 NOTE — [facility] Suicide Risk Assessment (Signed)
[facility] INPATIENT:  Family/Significant Other Suicide Prevention Education  Suicide Prevention Education:  Education Completed; Adela Glimpseonya Delio - mother [phone]),  (name of family member/significant other) has been identified by the patient as the family member/significant other with whom the patient will be residing, and identified as the person(s) who will aid the patient in the event of a mental health crisis (suicidal ideations/suicide attempt).  With written consent from the patient, the family member/significant other has been provided the following suicide prevention education, prior to the and/or following the discharge of the patient.  The suicide prevention education provided includes the following:  Suicide risk factors  Suicide prevention and interventions  National Suicide Hotline telephone number  [facility] assessment telephone number  [facility] Assistance 911  [facility] and/or Residential Mobile Crisis Unit telephone number  Request made of family/significant other to:  Remove weapons (e.g., guns, rifles, knives), all items previously/currently identified as safety concern.    Remove drugs/medications (over-the-counter, prescriptions, illicit drugs), all items previously/currently identified as a safety concern.  The family member/significant other verbalizes understanding of the suicide prevention education information provided.  The family member/significant other agrees to remove the items of safety concern listed above.  Carmina MillerHorton, Reinette Cuneo Nicole 05/31/2013, 8:35 AM

## 2013-05-31 NOTE — [facility] Suicide Risk Assessment (Signed)
Suicide Risk Assessment  Discharge Assessment     Demographic Factors:  Male, Caucasian and Unemployed  Mental Status Per Nursing Assessment::   On Admission:     Current Mental Status by Physician: Mental Status Examination: Patient appeared as per his stated age, casually dressed, and fairly groomed, and maintaining good eye contact. Patient has good mood and his affect was constricted. He has normal rate, rhythm, and volume of speech. His thought process is linear and goal directed. Patient has denied suicidal, homicidal ideations, intentions or plans. Patient has no evidence of auditory or visual hallucinations, delusions, and paranoia. Patient has fair insight judgment and impulse control.  Loss Factors: Financial problems/change in socioeconomic status  Historical Factors: Family history of mental illness or substance abuse and Impulsivity  Risk Reduction Factors:   Sense of responsibility to family, Religious beliefs about death, Living with another person, especially a relative, Positive social support, Positive therapeutic relationship and Positive coping skills or problem solving skills  Continued Clinical Symptoms:  Depression:   Recent sense of peace/wellbeing Alcohol/Substance Abuse/Dependencies Previous Psychiatric Diagnoses and Treatments Medical Diagnoses and Treatments/Surgeries  Cognitive Features That Contribute To Risk:  Polarized thinking    Suicide Risk:  Minimal: No identifiable suicidal ideation.  Patients presenting with no risk factors but with morbid ruminations; may be classified as minimal risk based on the severity of the depressive symptoms  Discharge Diagnoses:   AXIS I:  Major Depression, Recurrent severe, Substance Induced Mood Disorder and Polysubstance dependence AXIS II:  Deferred AXIS III:   Past Medical History  Diagnosis Date  . HIV (human immunodeficiency virus infection)   . Blood transfusion 2007  . Arthritis   . Kidney calculus    . Narcotic addiction   . Multiple pelvic fractures   . History of tonsillectomy   . hip fracture left     rod placement 04-2006  . History of spleen injury     removal 2007  . Mental disorder   . Depression    AXIS IV:  economic problems, other psychosocial or environmental problems, problems related to social environment and problems with primary support group AXIS V:  61-70 mild symptoms  Plan Of Care/Follow-up recommendations:  Activity:  As tolerated Diet:  Regular  Is patient on multiple antipsychotic therapies at discharge:  No   Has Patient had three or more failed trials of antipsychotic monotherapy by history:  No  Recommended Plan for Multiple Antipsychotic Therapies: NA  Smith Potenza,JANARDHAHA R. 05/31/2013, 12:23 PM

## 2013-06-03 NOTE — Progress Notes (Signed)
Patient Discharge Instructions:  After Visit Summary (AVS):   Faxed to:  06/03/13 Discharge Summary Note:   Faxed to:  06/03/13 Psychiatric Admission Assessment Note:   Faxed to:  06/03/13 Suicide Risk Assessment - Discharge Assessment:   Faxed to:  06/03/13 Faxed/Sent to the Next Level Care provider:  06/03/13 Faxed to [facility] @ [phone] [facility]ax[facility] to [facility] @ [phone]  Jerelene ReddenSheena E [place], 06/03/2013, 3:51 PM

## 2013-06-08 ENCOUNTER — Telehealth: Payer: Self-pay | Admitting: *Deleted

## 2013-06-23 ENCOUNTER — Ambulatory Visit: Payer: Self-pay | Admitting: Internal Medicine

## 2013-06-23 ENCOUNTER — Telehealth: Payer: Self-pay | Admitting: *Deleted

## 2013-06-23 NOTE — Telephone Encounter (Signed)
Called patient about his missed appt and had to leave a message for him to call the office asap.

## 2013-07-07 ENCOUNTER — Ambulatory Visit (INDEPENDENT_AMBULATORY_CARE_PROVIDER_SITE_OTHER): Payer: Self-pay | Admitting: Internal Medicine

## 2013-07-07 ENCOUNTER — Encounter: Payer: Self-pay | Admitting: Internal Medicine

## 2013-07-07 VITALS — BP 122/75 | HR 111 | Temp 98.2°F | Wt 204.0 lb

## 2013-07-07 DIAGNOSIS — Z21 Asymptomatic human immunodeficiency virus [HIV] infection status: Secondary | ICD-10-CM

## 2013-07-07 DIAGNOSIS — B20 Human immunodeficiency virus [HIV] disease: Secondary | ICD-10-CM

## 2013-07-07 LAB — CBC WITH DIFFERENTIAL/PLATELET
BASOS PCT: 0 % (ref 0–1)
Basophils Absolute: 0 10*3/uL (ref 0.0–0.1)
Eosinophils Absolute: 0.1 10*3/uL (ref 0.0–0.7)
Eosinophils Relative: 1 % (ref 0–5)
HCT: 43.9 % (ref 39.0–52.0)
HEMOGLOBIN: 15.7 g/dL (ref 13.0–17.0)
Lymphocytes Relative: 31 % (ref 12–46)
Lymphs Abs: 3.4 10*3/uL (ref 0.7–4.0)
MCH: 32 pg (ref 26.0–34.0)
MCHC: 35.8 g/dL (ref 30.0–36.0)
MCV: 89.6 fL (ref 78.0–100.0)
MONO ABS: 0.6 10*3/uL (ref 0.1–1.0)
MONOS PCT: 5 % (ref 3–12)
Neutro Abs: 6.9 10*3/uL (ref 1.7–7.7)
Neutrophils Relative %: 63 % (ref 43–77)
Platelets: 343 10*3/uL (ref 150–400)
RBC: 4.9 MIL/uL (ref 4.22–5.81)
RDW: 14.5 % (ref 11.5–15.5)
WBC: 11 10*3/uL — ABNORMAL HIGH (ref 4.0–10.5)

## 2013-07-07 NOTE — Progress Notes (Signed)
Subjective:    Patient ID: Devin Crawford, male    DOB: 26-Feb-1981, 33 y.o.   MRN: [id number]  HPI Devin Crawford, 33 yo. M with HIV and significant MDD, anxiety disorder and PTSD. Concern for abn on labs with lymphocytosis for lab work when he was hospitalized for suicide ideation  Now on wellbutrin in addition to zoloft seeing psych at [facility] but not having any counseling as of yet. Not feeling any better, but no suicide ideation   Current Outpatient Prescriptions on File Prior to Visit  Medication Sig Dispense Refill  . acetaminophen (TYLENOL) 500 MG tablet Take 500 mg by mouth every 6 (six) hours as needed. For pain      . ALPRAZolam (XANAX) 1 MG tablet Take 1 mg by mouth every 4 (four) hours as needed. For anxiety      . buPROPion (WELLBUTRIN XL) 150 MG 24 hr tablet Take 1 tablet (150 mg total) by mouth daily.  30 tablet  0  . elvitegravir-cobicistat-emtricitabine-tenofovir (STRIBILD) 150-150-200-300 MG TABS tablet Take 1 tablet by mouth daily with breakfast.  30 tablet  11  . fentaNYL (DURAGESIC - DOSED MCG/HR) 25 MCG/HR patch Place 25 mcg onto the skin every other day.      [name] gemfibrozil (LOPID) 600 MG tablet Take 1 tablet (600 mg total) by mouth 2 (two) times daily before a meal.  60 tablet  11  . Multiple Vitamin (MULITIVITAMIN WITH MINERALS) TABS Take 1 tablet by mouth daily.      [name] omeprazole (PRILOSEC) 40 MG capsule Take 1 capsule (40 mg total) by mouth daily.  30 capsule  11  . oxyCODONE (OXY IR/ROXICODONE) 5 MG immediate release tablet Take 5 mg by mouth every 6 (six) hours as needed for severe pain.      . polyethylene glycol (MIRALAX / GLYCOLAX) packet Take 17 g by mouth daily.      . sertraline (ZOLOFT) 100 MG tablet Take 200 mg by mouth daily.      . traMADol (ULTRAM) 50 MG tablet Take 50 mg by mouth every 6 (six) hours as needed for pain.      . traZODone (DESYREL) 100 MG tablet Take 1 tablet (100 mg total) by mouth at bedtime as needed for sleep.  30 tablet  0   No current  facility-administered medications on file prior to visit.     Review of Systems  Constitutional: Positive for nighsweats. Negative for fever, chills, diaphoresis, activity change, appetite change, fatigue and unexpected weight change.  HENT: Negative for congestion, sore throat, rhinorrhea, sneezing, trouble swallowing and sinus pressure.  Eyes: Negative for photophobia and visual disturbance.  Respiratory: Negative for cough, chest tightness, shortness of breath, wheezing and stridor.  Cardiovascular: Negative for chest pain, palpitations and leg swelling.  Gastrointestinal: Negative for nausea, vomiting, abdominal pain, diarrhea, constipation, blood in stool, abdominal distention and anal bleeding.  Genitourinary: Negative for dysuria, hematuria, flank pain and difficulty urinating.  Musculoskeletal: Negative for myalgias, back pain, joint swelling, arthralgias and gait problem.  Skin: Negative for color change, pallor, rash and wound.  Neurological: Negative for dizziness, tremors, weakness and light-headedness.  Hematological: Negative for adenopathy. Does not bruise/bleed easily.  Psychiatric/Behavioral: Negative for behavioral problems, confusion, sleep disturbance, dysphoric mood, decreased concentration and agitation.       Objective:   Physical Exam BP 122/75  Pulse 111  Temp(Src) 98.2 F (36.8 C) (Oral)  Wt 204 lb (92.534 kg) Physical Exam  Constitutional:  oriented to person, place, and time.  appears well-developed and well-nourished. No distress.  HENT:  Mouth/Throat: Oropharynx is clear and moist. No oropharyngeal exudate.  Cardiovascular: Normal rate, regular rhythm and normal heart sounds. Exam reveals no gallop and no friction rub.  No murmur heard.  Pulmonary/Chest: Effort normal and breath sounds normal. No respiratory distress.  has no wheezes.  Abdominal: Soft. Bowel sounds are normal.  exhibits no distension. There is no tenderness.  Lymphadenopathy: no  cervical adenopathy.  Neurological: alert and oriented to person, place, and time.  Skin: Skin is warm and dry. No rash noted. No erythema.  Psychiatric: a normal mood and affect. His behavior is normal.  Lab Results  Component Value Date   WBC 11.1* 05/29/2013   HGB 15.1 05/29/2013   HCT 44.9 05/29/2013   MCV 96.8 05/29/2013   PLT 370 05/29/2013        Assessment & Plan:  hiv = well controlled, continue with stribild  Lymphocytosis = will repeat and based upon its values, will consider to send for phenotyping. Will get orange card in anticipation to refer to heme if cbc continues to be abnormal  Depression = getting info to go to family services. Then [facility] 4[facility]5 wk out. Follow up with [facility] for [facility] meds  rtc in 2 wk

## 2013-07-12 ENCOUNTER — Ambulatory Visit: Payer: Self-pay

## 2013-07-12 ENCOUNTER — Encounter: Payer: Self-pay | Admitting: *Deleted

## 2013-07-19 ENCOUNTER — Other Ambulatory Visit: Payer: Self-pay | Admitting: *Deleted

## 2013-07-19 ENCOUNTER — Other Ambulatory Visit: Payer: Self-pay | Admitting: Licensed Clinical Social Worker

## 2013-07-19 DIAGNOSIS — K219 Gastro-esophageal reflux disease without esophagitis: Secondary | ICD-10-CM

## 2013-07-19 DIAGNOSIS — B20 Human immunodeficiency virus [HIV] disease: Secondary | ICD-10-CM

## 2013-07-19 MED ORDER — ELVITEG-COBIC-EMTRICIT-TENOFDF 150-150-200-300 MG PO TABS: 1.0000 | ORAL_TABLET | Freq: Every day | ORAL | Status: DC

## 2013-07-19 MED ORDER — OMEPRAZOLE 40 MG PO CPDR: 40.0000 mg | DELAYED_RELEASE_CAPSULE | Freq: Every day | ORAL | Status: DC

## 2013-07-19 NOTE — Progress Notes (Signed)
ADAP application 

## 2013-07-28 ENCOUNTER — Ambulatory Visit: Payer: Self-pay | Admitting: Internal Medicine

## 2013-08-06 ENCOUNTER — Encounter (HOSPITAL_COMMUNITY): Payer: Self-pay

## 2013-08-06 ENCOUNTER — Emergency Department (HOSPITAL_COMMUNITY)
Admission: EM | Admit: 2013-08-06 | Discharge: 2013-08-06 | Disposition: A | Discharge status: Other Acute Inpatient Hospital | Payer: Self-pay | Attending: Emergency Medicine | Admitting: Emergency Medicine

## 2013-08-06 ENCOUNTER — Inpatient Hospital Stay (HOSPITAL_COMMUNITY)
Admission: AD | Admit: 2013-08-06 | Discharge: 2013-08-10 | DRG: 885 | Disposition: A | Discharge status: Home / Self Care | Payer: Federal, State, Local not specified - Other | Source: Intra-hospital | Attending: Psychiatry | Admitting: Psychiatry

## 2013-08-06 ENCOUNTER — Ambulatory Visit (HOSPITAL_COMMUNITY)
Admission: RE | Admit: 2013-08-06 | Discharge: 2013-08-06 | Disposition: A | Discharge status: Home / Self Care | Payer: Federal, State, Local not specified - Other | Source: Home / Self Care | Attending: Psychiatry | Admitting: Psychiatry

## 2013-08-06 ENCOUNTER — Encounter (HOSPITAL_COMMUNITY): Payer: Self-pay | Admitting: Emergency Medicine

## 2013-08-06 DIAGNOSIS — F4310 Post-traumatic stress disorder, unspecified: Secondary | ICD-10-CM | POA: Diagnosis present

## 2013-08-06 DIAGNOSIS — E785 Hyperlipidemia, unspecified: Secondary | ICD-10-CM | POA: Diagnosis present

## 2013-08-06 DIAGNOSIS — Z79899 Other long term (current) drug therapy: Secondary | ICD-10-CM | POA: Insufficient documentation

## 2013-08-06 DIAGNOSIS — F329 Major depressive disorder, single episode, unspecified: Secondary | ICD-10-CM | POA: Insufficient documentation

## 2013-08-06 DIAGNOSIS — Z880 Allergy status to penicillin: Secondary | ICD-10-CM | POA: Insufficient documentation

## 2013-08-06 DIAGNOSIS — F1920 Other psychoactive substance dependence, uncomplicated: Secondary | ICD-10-CM | POA: Insufficient documentation

## 2013-08-06 DIAGNOSIS — Z21 Asymptomatic human immunodeficiency virus [HIV] infection status: Secondary | ICD-10-CM | POA: Insufficient documentation

## 2013-08-06 DIAGNOSIS — F419 Anxiety disorder, unspecified: Secondary | ICD-10-CM

## 2013-08-06 DIAGNOSIS — F1994 Other psychoactive substance use, unspecified with psychoactive substance-induced mood disorder: Secondary | ICD-10-CM | POA: Diagnosis present

## 2013-08-06 DIAGNOSIS — F411 Generalized anxiety disorder: Secondary | ICD-10-CM | POA: Diagnosis present

## 2013-08-06 DIAGNOSIS — R197 Diarrhea, unspecified: Secondary | ICD-10-CM | POA: Insufficient documentation

## 2013-08-06 DIAGNOSIS — M129 Arthropathy, unspecified: Secondary | ICD-10-CM | POA: Diagnosis present

## 2013-08-06 DIAGNOSIS — F332 Major depressive disorder, recurrent severe without psychotic features: Principal | ICD-10-CM | POA: Diagnosis present

## 2013-08-06 DIAGNOSIS — R1110 Vomiting, unspecified: Secondary | ICD-10-CM | POA: Insufficient documentation

## 2013-08-06 DIAGNOSIS — E781 Pure hyperglyceridemia: Secondary | ICD-10-CM

## 2013-08-06 DIAGNOSIS — F3289 Other specified depressive episodes: Secondary | ICD-10-CM | POA: Insufficient documentation

## 2013-08-06 DIAGNOSIS — G894 Chronic pain syndrome: Secondary | ICD-10-CM

## 2013-08-06 DIAGNOSIS — F17200 Nicotine dependence, unspecified, uncomplicated: Secondary | ICD-10-CM | POA: Insufficient documentation

## 2013-08-06 DIAGNOSIS — Z9119 Patient's noncompliance with other medical treatment and regimen: Secondary | ICD-10-CM

## 2013-08-06 DIAGNOSIS — G4700 Insomnia, unspecified: Secondary | ICD-10-CM | POA: Diagnosis present

## 2013-08-06 DIAGNOSIS — Z823 Family history of stroke: Secondary | ICD-10-CM

## 2013-08-06 DIAGNOSIS — Z91199 Patient's noncompliance with other medical treatment and regimen due to unspecified reason: Secondary | ICD-10-CM

## 2013-08-06 DIAGNOSIS — Z87891 Personal history of nicotine dependence: Secondary | ICD-10-CM

## 2013-08-06 DIAGNOSIS — G4710 Hypersomnia, unspecified: Secondary | ICD-10-CM | POA: Diagnosis present

## 2013-08-06 DIAGNOSIS — B20 Human immunodeficiency virus [HIV] disease: Secondary | ICD-10-CM | POA: Diagnosis present

## 2013-08-06 DIAGNOSIS — R61 Generalized hyperhidrosis: Secondary | ICD-10-CM | POA: Insufficient documentation

## 2013-08-06 DIAGNOSIS — K219 Gastro-esophageal reflux disease without esophagitis: Secondary | ICD-10-CM | POA: Diagnosis present

## 2013-08-06 DIAGNOSIS — R45851 Suicidal ideations: Secondary | ICD-10-CM | POA: Insufficient documentation

## 2013-08-06 LAB — RAPID URINE DRUG SCREEN, HOSP PERFORMED
AMPHETAMINES: NOT DETECTED
Barbiturates: NOT DETECTED
Benzodiazepines: NOT DETECTED
Cocaine: NOT DETECTED
OPIATES: POSITIVE — AB
TETRAHYDROCANNABINOL: NOT DETECTED

## 2013-08-06 LAB — COMPREHENSIVE METABOLIC PANEL
ALBUMIN: 4.8 g/dL (ref 3.5–5.2)
ALK PHOS: 147 U/L — AB (ref 39–117)
ALT: 41 U/L (ref 0–53)
AST: 25 U/L (ref 0–37)
BUN: 9 mg/dL (ref 6–23)
CHLORIDE: 103 meq/L (ref 96–112)
CO2: 19 mEq/L (ref 19–32)
Calcium: 10.2 mg/dL (ref 8.4–10.5)
Creatinine, Ser: 1.11 mg/dL (ref 0.50–1.35)
GFR calc Af Amer: >90 mL/min (ref >90)
GFR calc non Af Amer: 88 mL/min — ABNORMAL LOW (ref >90)
GLUCOSE: 109 mg/dL — AB (ref 70–99)
Potassium: 3.7 mEq/L (ref 3.7–5.3)
Sodium: 141 mEq/L (ref 137–147)
Total Bilirubin: 0.6 mg/dL (ref 0.3–1.2)
Total Protein: 8.5 g/dL — ABNORMAL HIGH (ref 6.0–8.3)

## 2013-08-06 LAB — ETHANOL

## 2013-08-06 LAB — CBC
HCT: 44.9 % (ref 39.0–52.0)
Hemoglobin: 15.7 g/dL (ref 13.0–17.0)
MCH: 32.2 pg (ref 26.0–34.0)
MCHC: 35 g/dL (ref 30.0–36.0)
MCV: 92.2 fL (ref 78.0–100.0)
PLATELETS: 432 10*3/uL — AB (ref 150–400)
RBC: 4.87 MIL/uL (ref 4.22–5.81)
RDW: 14.9 % (ref 11.5–15.5)
WBC: 17.2 10*3/uL — AB (ref 4.0–10.5)

## 2013-08-06 MED ORDER — BUPROPION HCL ER (XL) 150 MG PO TB24
150.0000 mg | ORAL_TABLET | Freq: Every day | ORAL | Status: DC
  Administered 2013-08-07 – 2013-08-10 (×4): 150 mg via ORAL
  Filled 2013-08-06: qty 14
  Filled 2013-08-06 (×5): qty 1

## 2013-08-06 MED ORDER — BUPROPION HCL ER (XL) 150 MG PO TB24
150.0000 mg | ORAL_TABLET | Freq: Every day | ORAL | Status: DC
  Filled 2013-08-06: qty 1

## 2013-08-06 MED ORDER — FENTANYL 12 MCG/HR TD PT72: 25.0000 ug | MEDICATED_PATCH | TRANSDERMAL | Status: DC

## 2013-08-06 MED ORDER — METHOCARBAMOL 500 MG PO TABS
500.0000 mg | ORAL_TABLET | Freq: Three times a day (TID) | ORAL | Status: DC | PRN
  Administered 2013-08-06: 500 mg via ORAL
  Filled 2013-08-06: qty 1

## 2013-08-06 MED ORDER — RAMELTEON 8 MG PO TABS
8.0000 mg | ORAL_TABLET | Freq: Every day | ORAL | Status: DC
  Administered 2013-08-06 – 2013-08-09 (×4): 8 mg via ORAL
  Filled 2013-08-06 (×4): qty 1
  Filled 2013-08-06: qty 14
  Filled 2013-08-06 (×2): qty 1

## 2013-08-06 MED ORDER — CLONIDINE HCL 0.1 MG PO TABS
0.1000 mg | ORAL_TABLET | Freq: Four times a day (QID) | ORAL | Status: DC
  Administered 2013-08-06: 0.1 mg via ORAL
  Filled 2013-08-06: qty 1

## 2013-08-06 MED ORDER — GEMFIBROZIL 600 MG PO TABS
600.0000 mg | ORAL_TABLET | Freq: Two times a day (BID) | ORAL | Status: DC
  Administered 2013-08-07 – 2013-08-10 (×7): 600 mg via ORAL
  Filled 2013-08-06 (×12): qty 1

## 2013-08-06 MED ORDER — ONDANSETRON HCL 4 MG PO TABS
4.0000 mg | ORAL_TABLET | Freq: Three times a day (TID) | ORAL | Status: DC | PRN
Start: 2013-08-06 — End: 2013-08-06

## 2013-08-06 MED ORDER — ONDANSETRON 4 MG PO TBDP
4.0000 mg | ORAL_TABLET | Freq: Four times a day (QID) | ORAL | Status: DC | PRN
Start: 2013-08-06 — End: 2013-08-06

## 2013-08-06 MED ORDER — POLYETHYLENE GLYCOL 3350 17 G PO PACK
17.0000 g | PACK | Freq: Every day | ORAL | Status: DC
  Filled 2013-08-06: qty 1

## 2013-08-06 MED ORDER — FENTANYL 25 MCG/HR TD PT72
25.0000 ug | MEDICATED_PATCH | TRANSDERMAL | Status: DC
  Administered 2013-08-08: 25 ug via TRANSDERMAL
  Filled 2013-08-06: qty 1

## 2013-08-06 MED ORDER — CLONIDINE HCL 0.1 MG PO TABS: 0.1000 mg | ORAL_TABLET | Freq: Every day | ORAL | Status: DC

## 2013-08-06 MED ORDER — PANTOPRAZOLE SODIUM 40 MG PO TBEC
80.0000 mg | DELAYED_RELEASE_TABLET | Freq: Every day | ORAL | Status: DC
  Administered 2013-08-07 – 2013-08-10 (×4): 80 mg via ORAL
  Filled 2013-08-06 (×6): qty 2

## 2013-08-06 MED ORDER — NAPROXEN 250 MG PO TABS
250.0000 mg | ORAL_TABLET | Freq: Two times a day (BID) | ORAL | Status: DC
  Administered 2013-08-07 – 2013-08-10 (×7): 250 mg via ORAL
  Filled 2013-08-06 (×11): qty 1

## 2013-08-06 MED ORDER — ADULT MULTIVITAMIN W/MINERALS CH
1.0000 | ORAL_TABLET | Freq: Every day | ORAL | Status: DC
  Filled 2013-08-06: qty 1

## 2013-08-06 MED ORDER — DICYCLOMINE HCL 20 MG PO TABS: 20.0000 mg | ORAL_TABLET | Freq: Four times a day (QID) | ORAL | Status: DC | PRN

## 2013-08-06 MED ORDER — IBUPROFEN 600 MG PO TABS
600.0000 mg | ORAL_TABLET | Freq: Four times a day (QID) | ORAL | Status: DC | PRN
  Administered 2013-08-07 – 2013-08-10 (×5): 600 mg via ORAL
  Filled 2013-08-06 (×5): qty 1

## 2013-08-06 MED ORDER — PANTOPRAZOLE SODIUM 40 MG PO TBEC
40.0000 mg | DELAYED_RELEASE_TABLET | Freq: Every day | ORAL | Status: DC
  Filled 2013-08-06: qty 1

## 2013-08-06 MED ORDER — NAPROXEN 500 MG PO TABS
500.0000 mg | ORAL_TABLET | Freq: Two times a day (BID) | ORAL | Status: DC | PRN
  Administered 2013-08-06: 500 mg via ORAL
  Filled 2013-08-06: qty 1

## 2013-08-06 MED ORDER — ELVITEG-COBIC-EMTRICIT-TENOFDF 150-150-200-300 MG PO TABS
1.0000 | ORAL_TABLET | Freq: Every day | ORAL | Status: DC
Start: 2013-08-07 — End: 2013-08-10
  Administered 2013-08-07 – 2013-08-10 (×4): 1 via ORAL
  Filled 2013-08-06 (×6): qty 1

## 2013-08-06 MED ORDER — HYDROXYZINE HCL 25 MG PO TABS
25.0000 mg | ORAL_TABLET | Freq: Once | ORAL | Status: AC
  Administered 2013-08-06: 25 mg via ORAL
  Filled 2013-08-06: qty 1

## 2013-08-06 MED ORDER — ELVITEG-COBIC-EMTRICIT-TENOFDF 150-150-200-300 MG PO TABS
1.0000 | ORAL_TABLET | Freq: Every day | ORAL | Status: DC
  Filled 2013-08-06: qty 1

## 2013-08-06 MED ORDER — ADULT MULTIVITAMIN W/MINERALS CH
1.0000 | ORAL_TABLET | Freq: Every day | ORAL | Status: DC
Start: 2013-08-07 — End: 2013-08-10
  Administered 2013-08-07 – 2013-08-10 (×4): 1 via ORAL
  Filled 2013-08-06 (×7): qty 1

## 2013-08-06 MED ORDER — MAGNESIUM HYDROXIDE 400 MG/5ML PO SUSP: 30.0000 mL | Freq: Every day | ORAL | Status: DC | PRN

## 2013-08-06 MED ORDER — SERTRALINE HCL 50 MG PO TABS
200.0000 mg | ORAL_TABLET | Freq: Every day | ORAL | Status: DC
  Filled 2013-08-06: qty 4

## 2013-08-06 MED ORDER — TRAZODONE HCL 100 MG PO TABS: 100.0000 mg | ORAL_TABLET | Freq: Every evening | ORAL | Status: DC | PRN

## 2013-08-06 MED ORDER — SERTRALINE HCL 100 MG PO TABS
100.0000 mg | ORAL_TABLET | Freq: Every day | ORAL | Status: DC
  Administered 2013-08-07 – 2013-08-08 (×2): 100 mg via ORAL
  Filled 2013-08-06 (×4): qty 1

## 2013-08-06 MED ORDER — LOPERAMIDE HCL 2 MG PO CAPS
2.0000 mg | ORAL_CAPSULE | ORAL | Status: DC | PRN
  Filled 2013-08-06: qty 2

## 2013-08-06 MED ORDER — HYDROXYZINE HCL 25 MG PO TABS
25.0000 mg | ORAL_TABLET | Freq: Four times a day (QID) | ORAL | Status: DC | PRN
  Administered 2013-08-06: 25 mg via ORAL
  Filled 2013-08-06: qty 1

## 2013-08-06 MED ORDER — ALUM & MAG HYDROXIDE-SIMETH 200-200-20 MG/5ML PO SUSP: 30.0000 mL | ORAL | Status: DC | PRN

## 2013-08-06 MED ORDER — CLONIDINE HCL 0.1 MG PO TABS: 0.1000 mg | ORAL_TABLET | ORAL | Status: DC

## 2013-08-06 MED ORDER — ZOLPIDEM TARTRATE 5 MG PO TABS: 5.0000 mg | ORAL_TABLET | Freq: Every evening | ORAL | Status: DC | PRN

## 2013-08-06 MED ORDER — HYDROXYZINE HCL 25 MG PO TABS
25.0000 mg | ORAL_TABLET | Freq: Four times a day (QID) | ORAL | Status: DC | PRN
  Administered 2013-08-06 – 2013-08-10 (×9): 25 mg via ORAL
  Filled 2013-08-06 (×10): qty 1

## 2013-08-06 MED ORDER — FENTANYL 25 MCG/HR TD PT72
25.0000 ug | MEDICATED_PATCH | TRANSDERMAL | Status: AC
  Administered 2013-08-06: 25 ug via TRANSDERMAL
  Filled 2013-08-06: qty 1

## 2013-08-06 MED ORDER — SERTRALINE HCL 50 MG PO TABS: 200.0000 mg | ORAL_TABLET | Freq: Every day | ORAL | Status: DC

## 2013-08-06 MED ORDER — ACETAMINOPHEN 325 MG PO TABS
650.0000 mg | ORAL_TABLET | Freq: Four times a day (QID) | ORAL | Status: DC | PRN
  Administered 2013-08-06 – 2013-08-08 (×2): 650 mg via ORAL
  Filled 2013-08-06 (×2): qty 2

## 2013-08-06 NOTE — ED Notes (Signed)
Ambulatory to Room 36 from traiage

## 2013-08-06 NOTE — ED Notes (Signed)
Up to the desk on the phone 

## 2013-08-06 NOTE — ED Notes (Addendum)
Pt sitting on bed tearful, anxious, restless, hot/cold flashes.  Pt reports that he has never been thru detox like this before, that [facility] being given his pain meds.  Pt tearful and questioned if he can detox.  Support given, PO fluids encouraged, cool cloths given.  Catha NottinghamJamison NP up dated.

## 2013-08-06 NOTE — ED Notes (Signed)
Pt states he woke up this morning and told his partner that he wanted to kill himself with carbon monoxide.  Denies any major life trigger that preempted these thoughts.  Has been abusing his fentanyl, oxycodone, and tramadol.

## 2013-08-06 NOTE — ED Notes (Signed)
Pharmacy tech into see 

## 2013-08-06 NOTE — ED Provider Notes (Signed)
8:23 PM [facility]. I did not directly participate in the care of this patient.  Junius ArgyleForrest S Leo Fray, MD 08/06/13 [phone]
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
## 2013-08-06 NOTE — Tx Team (Signed)
Initial Interdisciplinary Treatment Plan  PATIENT STRENGTHS: (choose at least two) Ability for insight Motivation for treatment/growth  PATIENT STRESSORS: Health problems Medication change or noncompliance   PROBLEM LIST: Problem List/Patient Goals Date to be addressed Date deferred Reason deferred Estimated date of resolution  Anxiety  08/06/2013     Depression 08/06/2013     Withdrawal symptoms from medications 08/06/2013     SI 08/06/2013                                    DISCHARGE CRITERIA:  Improved stabilization in mood, thinking, and/or behavior Need for constant or close observation no longer present Withdrawal symptoms are absent or subacute and managed without 24-hour nursing intervention  PRELIMINARY DISCHARGE PLAN: Return to previous living arrangement  PATIENT/FAMIILY INVOLVEMENT: This treatment plan has been presented to and reviewed with the patient, Devin Crawford, and/or family member.  The patient and family have been given the opportunity to ask questions and make suggestions.  Devin Crawford, Devin Crawford 08/06/2013, 10:40 PM

## 2013-08-06 NOTE — ED Provider Notes (Signed)
CSN: [id number]     Arrival date & time 08/06/13  1415 History  This chart was scribed for non-physician practitioner Arthor Captain working with Dennard Nip, MD by Carl Best, ED Scribe. This patient was seen in room WTR3/WLPT3 and the patient's care was started at 3:19 PM.    Chief Complaint  Patient presents with  . Medical Clearance     The history is provided by the patient. No language interpreter was used.   HPI Comments: Devin Crawford is a 33 y.o. male who presents to the Emergency Department complaining of SI and depression.  The patient states that he believes these thoughts and feelings have stemmed from withdrawing Oxycodone, Tramadol, and Phentanol prescribed for his leg and being diagnosed with HIV.  The patient states that he has been taking his pain medication for 2 years.  He states that his PCP Dr. Basilio Cairo has been managing his pain medication.  He states that he last took his pain medication 2 days ago.  The patient states that he is unhappy because he is dependent on his pain medication and his diagnosis of HIV.  He states that he was diagnosed with HIV in 2006.  He states that he started experiencing depression when he was 18.  He states that he quit his job and does not go to school.  He states that he used to find pleasure in doing things.  He states that he has not been sleeping normally and has not eaten since yesterday at 5 PM.  He denies drinking alcohol or using illegal drugs.  He states that he has been dry heaving, diaphoretic, and experiencing diarrhea.  The patient states that he told his partner he wanted to kill himself with carbon monoxide.  He states that he came to the ED in January for similar symptoms but did not tell the provider about his opiate abuse.     Past Medical History  Diagnosis Date  . HIV (human immunodeficiency virus infection)   . Blood transfusion 2007  . Arthritis   . Kidney calculus   . Narcotic addiction   . Multiple pelvic  fractures   . History of tonsillectomy   . hip fracture left     rod placement 04-2006  . History of spleen injury     removal 2007  . Mental disorder   . Depression    Past Surgical History  Procedure Laterality Date  . Splenectomy    . Hip fracture surgery    . Tonsillectomy     Family History  Problem Relation Age of Onset  . Multiple sclerosis Mother   . Stroke Father    History  Substance Use Topics  . Smoking status: Current Every Day Smoker -- 0.20 packs/day for 9 years    Types: Cigarettes  . Smokeless tobacco: Never Used  . Alcohol Use: No    Review of Systems  Constitutional: Positive for diaphoresis.  Gastrointestinal: Positive for diarrhea.  Psychiatric/Behavioral: Positive for suicidal ideas and sleep disturbance.  All other systems reviewed and are negative.      Allergies  Morphine and related; Penicillins; Sulfa antibiotics; Capsaicin; and Sustiva  Home Medications   Current Outpatient Rx  Name  Route  Sig  Dispense  Refill  . acetaminophen (TYLENOL) 500 MG tablet   Oral   Take 500 mg by mouth every 6 (six) hours as needed. For pain         . ALPRAZolam (XANAX) 1 MG tablet   Oral  Take 1 mg by mouth every 4 (four) hours as needed. For anxiety         . buPROPion (WELLBUTRIN XL) 150 MG 24 hr tablet   Oral   Take 1 tablet (150 mg total) by mouth daily.   30 tablet   0   . elvitegravir-cobicistat-emtricitabine-tenofovir (STRIBILD) 150-150-200-300 MG TABS tablet   Oral   Take 1 tablet by mouth daily with breakfast.   30 tablet   11   . fentaNYL (DURAGESIC - DOSED MCG/HR) 25 MCG/HR patch   Transdermal   Place 25 mcg onto the skin every other day.         [name]. gemfibrozil (LOPID) 600 MG tablet   Oral   Take 1 tablet (600 mg total) by mouth 2 (two) times daily before a meal.   60 tablet   11   . Multiple Vitamin (MULITIVITAMIN WITH MINERALS) TABS   Oral   Take 1 tablet by mouth daily.         [name]. omeprazole (PRILOSEC) 40 MG  capsule   Oral   Take 1 capsule (40 mg total) by mouth daily.   30 capsule   2   . oxyCODONE (OXY IR/ROXICODONE) 5 MG immediate release tablet   Oral   Take 5 mg by mouth every 6 (six) hours as needed for severe pain.         . polyethylene glycol (MIRALAX / GLYCOLAX) packet   Oral   Take 17 g by mouth daily.         . sertraline (ZOLOFT) 100 MG tablet   Oral   Take 200 mg by mouth daily.         . traMADol (ULTRAM) 50 MG tablet   Oral   Take 50 mg by mouth every 6 (six) hours as needed for pain.         . traZODone (DESYREL) 100 MG tablet   Oral   Take 1 tablet (100 mg total) by mouth at bedtime as needed for sleep.   30 tablet   0    Triage Vitals: BP 127/81  Pulse 100  Temp(Src) 99.2 F (37.3 C) (Oral)  Resp 16  SpO2 97%  Physical Exam  Nursing note and vitals reviewed. Constitutional: He is oriented to person, place, and time. He appears well-developed and well-nourished. No distress.  HENT:  Head: Normocephalic and atraumatic.  Eyes: EOM are normal.  Neck: Neck supple. No tracheal deviation present.  Cardiovascular: Normal rate.   Pulmonary/Chest: Effort normal. No respiratory distress.  Musculoskeletal: Normal range of motion.  Neurological: He is alert and oriented to person, place, and time.  Skin: Skin is warm and dry.  Psychiatric: He has a normal mood and affect. His behavior is normal.    ED Course  Procedures (including critical care time)  DIAGNOSTIC STUDIES: Oxygen Saturation is 97% on room air, adequate by my interpretation.    COORDINATION OF CARE: 3:26 PM- Discussed administering medication in the ED to treat the patient's withdrawal symptoms and allowing the patient to talk to the psychiatric team.  The patient agreed to the treatment plan.     Labs Review Labs Reviewed  CBC  COMPREHENSIVE METABOLIC PANEL  URINE RAPID DRUG SCREEN (HOSP PERFORMED)  ETHANOL   Imaging Review No results found.   EKG Interpretation None       MDM   Final diagnoses:  Suicidal ideation   Patient suicidal with plan. Needs inpatient tx.  I personally performed the services  described in this documentation, which was scribed in my presence. The recorded information has been reviewed and is accurate.       Arthor Captain, PA-C 08/15/13 1939

## 2013-08-06 NOTE — BH Assessment (Signed)
Assessment Note  Devin Crawford is an 33 y.o. male that presented to [facility] as a walk-in.  Pt presents with SI with plan to poison himself by carbon monoxide.  Pt stated he has been having ongoing SI, but woke up an told his partner this AM that he had a plan.  Pt stated that he feels that the medications prescribed at [facility] in January 2015 weren't working and that he has been feeling depressed and anxious.  He also reported that due to his depression, he has been abusing his prescribed pain medications (Fentanyl, Tramadol, and Oxycodone).  Pt reported he has not had any pain medications in 2 days and that he feels nauseous, sweaty, and shaky.  Pt denies HI or psychosis.  Pt goes to [facility] for [employer], but stated he did not follow up with them.  Pt cannot identify any particular triggers, just stated he feels more depressed.  Pt lives with his partner and he is supportive, as is pt's mother by pt report.  Consulted with Serena Colonel, who agreed inpatient treatment warranted @ 1350.  Pt to be sent to [facility] for med clearance and there are no beds at [facility], so TTS will attempt to find pt placement elsewhere.  Called [facility] and notified charge nurse that pt would be arriving there via [place], as pt voluntary.  Updated TTS and [facility] staff, as well as Berneice Heinrich, AC.  Pt in agreement with disposition.  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 HIV (human immunodeficiency virus infection)   . Blood transfusion 2007  . Arthritis   . Kidney calculus   . Narcotic addiction   . Multiple pelvic fractures   . History of tonsillectomy   . hip fracture left     rod placement 04-2006  . History of spleen injury     removal 2007  . Mental disorder   . Depression    Axis IV: other psychosocial or environmental problems and problems with access to health care services Axis V: 21-30 behavior considerably  influenced by delusions or hallucinations OR serious impairment in judgment, communication OR inability to function in almost all areas  Past Medical History:  Past Medical History  Diagnosis Date  . HIV (human immunodeficiency virus infection)   . Blood transfusion 2007  . Arthritis   . Kidney calculus   . Narcotic addiction   . Multiple pelvic fractures   . History of tonsillectomy   . hip fracture left     rod placement 04-2006  . History of spleen injury     removal 2007  . Mental disorder   . Depression     Past Surgical History  Procedure Laterality Date  . Splenectomy    . Hip fracture surgery    . Tonsillectomy      Family History:  Family History  Problem Relation Age of Onset  . Multiple sclerosis Mother   . Stroke Father     Social History:  reports that he has been smoking Cigarettes.  He has a 1.8 pack-year smoking history. He has never used smokeless tobacco. He reports that he does not drink alcohol or use illicit drugs.  Additional Social History:  Alcohol / Drug Use Pain Medications: see med list Prescriptions: see med list Over the Counter: see med list History of alcohol / drug use?: No history of alcohol / drug abuse Longest period  of sobriety (when/how long):  (na) Negative Consequences of Use:  (na) Withdrawal Symptoms:  (na)  CIWA:   COWS:    Allergies:  Allergies  Allergen Reactions  . Morphine And Related     Hallucinations   . Penicillins     edema  . Sulfa Antibiotics Hives  . Capsaicin Rash  . Sustiva [Efavirenz] Rash    Whole body rash     Home Medications:  (Not in a hospital admission)  OB/GYN Status:  No LMP for male patient.  General Assessment Data Location of Assessment: [facility] Services Is this a Tele or Face-to-Face Assessment?: Tele Assessment Is this an Initial Assessment or a Re-assessment for this encounter?: Initial Assessment Living Arrangements: Spouse/significant other Can pt return to current  living arrangement?: Yes Admission Status: Voluntary Is patient capable of signing voluntary admission?: Yes Transfer from: Home Referral Source: Self/Family/Friend  Medical Screening Exam [facility] Walk-in ONLY) Medical Exam completed: No Reason for MSE not completed: Other: (pt sent to [facility] for med clearance)  [facility] Crisis Care Plan Living Arrangements: Spouse/significant other Name of Psychiatrist: [facility] Name of Therapist: none  Education Status Is patient currently in school?: No  Risk to self Suicidal Ideation: Yes-Currently Present Suicidal Intent: Yes-Currently Present Is patient at risk for suicide?: Yes Suicidal Plan?: Yes-Currently Present Specify Current Suicidal Plan: to find his gas line and kill self by carbon monoxide poisoning Access to Means: Yes Specify Access to Suicidal Means: can access gas line What has been your use of drugs/alcohol within the last 12 months?: pt admits to abusing his prescribed pain medications Previous Attempts/Gestures: Yes How many times?: 1 Other Self Harm Risks: pt denies Triggers for Past Attempts: Unknown Intentional Self Injurious Behavior: None Family Suicide History: No Recent stressful life event(s): Other (Comment) (absuing pain medications, depression, SI with plan) Persecutory voices/beliefs?: No Depression: Yes Depression Symptoms: Despondent;Insomnia;Tearfulness;Isolating;Fatigue;Guilt;Loss of interest in usual pleasures;Feeling worthless/self pity Substance abuse history and/or treatment for substance abuse?: No Suicide prevention information given to non-admitted patients: Not applicable  Risk to Others Homicidal Ideation: No Thoughts of Harm to Others: No Current Homicidal Intent: No Current Homicidal Plan: No Access to Homicidal Means: No Identified Victim: pt denies History of harm to others?: No Assessment of Violence: None Noted Violent Behavior Description: na - pt calm, cooperative Does patient have access  to weapons?: No Criminal Charges Pending?: No Does patient have a court date: No  Psychosis Hallucinations: None noted Delusions: None noted  Mental Status Report Appear/Hygiene: Other (Comment) (casual in street clothes, appropriate) Eye Contact: Good Motor Activity: Freedom of movement;Unremarkable Speech: Logical/coherent Level of Consciousness: Alert;Crying Mood: Depressed;Anxious Affect: Depressed;Appropriate to circumstance Anxiety Level: Moderate Thought Processes: Coherent;Relevant Judgement: Unimpaired Orientation: Person;Place;Time;Situation Obsessive Compulsive Thoughts/Behaviors: None  Cognitive Functioning Concentration: Decreased Memory: Recent Intact;Remote Intact IQ: Average Insight: Poor Impulse Control: Poor Appetite: Poor Weight Loss: 0 Weight Gain: 0 Sleep: Decreased Total Hours of Sleep:  (varies - wakes through night) Vegetative Symptoms: None  ADLScreening [facility] Assessment Services) Patient's cognitive ability adequate to safely complete daily activities?: Yes Patient able to express need for assistance with ADLs?: Yes Independently performs ADLs?: Yes (appropriate for developmental age)  Prior Inpatient Therapy Prior Inpatient Therapy: Yes Prior Therapy Dates: 2007, 2015 Prior Therapy Facilty/Provider(s): [place] Reason for Treatment: Depression, SI  Prior Outpatient Therapy Prior Outpatient Therapy: Yes Prior Therapy Dates: Current Prior Therapy Facilty/Provider(s): [facility] Reason for Treatment: med mgnt  ADL Screening (condition at time of admission) Patient's cognitive ability adequate to safely complete  daily activities?: Yes Is the patient deaf or have difficulty hearing?: No Does the patient have difficulty seeing, even when wearing glasses/contacts?: No Does the patient have difficulty concentrating, remembering, or making decisions?: No Patient able to express need for assistance with ADLs?: Yes Does the patient have  difficulty dressing or bathing?: No Independently performs ADLs?: Yes (appropriate for developmental age) Does the patient have difficulty walking or climbing stairs?: No  Home Assistive Devices/Equipment Home Assistive Devices/Equipment: None    Abuse/Neglect Assessment (Assessment to be complete while patient is alone) Physical Abuse: Denies Verbal Abuse: Denies Sexual Abuse: Denies Exploitation of patient/patient's resources: Denies Self-Neglect: Denies Values / Beliefs Cultural Requests During Hospitalization: None Spiritual Requests During Hospitalization: None Consults Spiritual Care Consult Needed: No Social Work Consult Needed: No Merchant navy officerAdvance Directives (For Healthcare) Advance Directive: Patient does not have advance directive;Patient would not like information    Additional Information 1:1 In Past 12 Months?: No CIRT Risk: No Elopement Risk: No Does patient have medical clearance?: No     Disposition:  Disposition Initial Assessment Completed for this Encounter: Yes Disposition of Patient: Referred to;Inpatient treatment program Type of inpatient treatment program: Adult  On Site Evaluation by:   Reviewed with Physician:    Casimer LaniusKristen Emmani Lesueur, MS, [facility] Licensed Professional Counselor Triage Specialist  08/06/2013 2:59 PM

## 2013-08-06 NOTE — ED Notes (Signed)
Calmer, but still anxious.  Pt now reports that he did take vicodin yesterday and that his last dose was about 1:00p

## 2013-08-07 MED ORDER — ZOLPIDEM TARTRATE 5 MG PO TABS
5.0000 mg | ORAL_TABLET | Freq: Every evening | ORAL | Status: DC | PRN
  Administered 2013-08-07 – 2013-08-09 (×3): 5 mg via ORAL
  Filled 2013-08-07 (×3): qty 1

## 2013-08-07 MED ORDER — DULOXETINE HCL 30 MG PO CPEP
30.0000 mg | ORAL_CAPSULE | Freq: Every day | ORAL | Status: DC
  Administered 2013-08-07 – 2013-08-08 (×2): 30 mg via ORAL
  Filled 2013-08-07 (×4): qty 1

## 2013-08-07 NOTE — Progress Notes (Signed)
Adult Psychoeducational Group Note  Date:  08/07/2013 Time:  3:37 PM  Group Topic/Focus:  Coping Skills  Participation Level:  Active  Participation Quality:  Appropriate and Attentive  Affect:  Appropriate  Cognitive:  Appropriate and Oriented  Insight: Appropriate  Engagement in Group:  Engaged  Modes of Intervention:  Activity, Discussion and Socialization   Elijio MilesMercer, Akira Adelsberger N 08/07/2013, 3:37 PM
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
## 2013-08-07 NOTE — Progress Notes (Signed)
Writer spoke with patient 1:1 and he reports that he is accepting the fact that he's here and is making the best of his situation and taking advantage of the groups while here. Patient reports he didn't sleep well last night and has been up all day and is hopeful that he will rest better tonight with the ambien being added. He currently denies si/hi/a/v hallucinations. He is compliant with medications and safety maintained on unit with 15 min checks.

## 2013-08-07 NOTE — Progress Notes (Signed)
Devin Crawford is OOB UAL on the 500 hall today...toelrated fair. He makes good eye contact. HE is visibly anxious, he apologizes every time he asks for something and he shares in the group today that " last time I didn't get it but this time I did".   [name] takes all meds as they are scheduled. He completes his AM self inventory and on it he writes he cont to have " off and on" SI ( he easily contracts verbally with this nurse when he is questioned about this), he rates his depression and hopelessness 7/7" and says his DC plan is  Exercise".   R Safety is in place and poc maintained.

## 2013-08-07 NOTE — [facility] Group Notes (Signed)
[facility] LCSW Group Therapy  08/07/2013  1: 15 pM  Type of Therapy:  Group Therapy  Participation Level:  Minimal  Participation Quality:  Attentive  Affect:  Anxious  Cognitive:  Alert and Oriented  Insight:  Limited  Engagement in Therapy:  Limited  Modes of Intervention:  Clarification, Discussion, Limit-setting, Rapport Building, Socialization and Support  Summary of Progress/Problems: The main focus of today's process group was to identify the patient's current support system and explore supports that can be put in place. We began group by each patient sharing something they are looking forward to this year.  An emphasis was placed on using counselor, doctor, therapy groups, 12-step groups, and problem-specific support groups to expand supports. There was also discussion about what constitutes a healthy support versus an unhealthy support.  Patient was attentive and shared only that he had just arrived today and this was his second admit and he intends to benefit more than when here before.   Clide DalesHarrill, Catherine Campbell

## 2013-08-07 NOTE — Progress Notes (Signed)
Psychoeducational Group Note  Date:  08/07/2013 Time:  1015  Group Topic/Focus:  Making Healthy Choices:   The focus of this group is to help patients identify negative/unhealthy choices they were using prior to admission and identify positive/healthier coping strategies to replace them upon discharge.  Participation Level:  Did not attend Arlene Brickel A 08/07/2013  

## 2013-08-07 NOTE — Progress Notes (Signed)
Adult Psychoeducational Group Note  Date:  08/07/2013 Time:  08:00pm Group Topic/Focus:  Wrap-Up Group:   The focus of this group is to help patients review their daily goal of treatment and discuss progress on daily workbooks.  Participation Level:  Active  Participation Quality:  Appropriate and Attentive  Affect:  Appropriate  Cognitive:  Alert and Appropriate  Insight: Appropriate  Engagement in Group:  Engaged  Modes of Intervention:  Discussion and Education  Additional Comments:   Pt attended first part of the group and was attentive and participated. Pt was asked how her day went and pt stated she had a good day. Pt stated he has accepted the fact that he is here and had a great visit with his dad.  Devin BombardGarner, Devin Crawford D 08/07/2013, 9:27 PM

## 2013-08-07 NOTE — Progress Notes (Signed)
Psychoeducational Group Note  Date:  08/07/2013 Time:  1015  Group Topic/Focus:  Making Healthy Choices:   The focus of this group is to help patients identify negative/unhealthy choices they were using prior to admission and identify positive/healthier coping strategies to replace them upon discharge.  Participation Level:  Active  Participation Quality:  Attentive  Affect:  Appropriate  Cognitive:  Oriented  Insight:  Improving  Engagement in Group:  Engaged  Additional Comments:    Nicky Kras A 08/07/2013  

## 2013-08-07 NOTE — [facility] Suicide Risk Assessment (Addendum)
   Nursing information obtained from:  Patient Demographic factors:  Male;Caucasian;Gay, lesbian, or bisexual orientation Current Mental Status:  Suicidal ideation indicated by patient Loss Factors:  NA Historical Factors:  NA Risk Reduction Factors:  Positive social support;Positive coping skills or problem solving skills Total Time spent with patient: 20 minutes  The patient reports that he came in to the ED after having suicidal thoughts with a plan to end his life through carbon monoxide poisoning.  He reports needing to change his fentanyl patch from every 3 days to every 2 days, but would like to go back to every 3 days.  He admits to feeling dependent on his pain medication. CLINICAL FACTORS:   Depression:   Severe Chronic Pain More than one psychiatric diagnosis Previous Psychiatric Diagnoses and Treatments Medical Diagnoses and Treatments/Surgeries  Psychiatric Specialty Exam: Physical ExamAs noted in chart  ROSAs noted in chart.  Blood pressure 119/81, pulse 89, temperature 97.6 F (36.4 C), temperature source Oral, resp. rate 14, height 5\' 8"  (1.727 m), weight 90.266 kg (199 lb).Body mass index is 30.26 kg/(m^2).  General Appearance: Casual  Eye Contact::  Poor  Speech:  Clear and Coherent and Normal Rate  Volume:  Normal  Mood:  Anxious  Affect:  Appropriate, Congruent and Full Range  Thought Process:  Coherent, Linear and Logical  Orientation:  Full (Time, Place, and Person)  Thought Content:  WDL  Suicidal Thoughts:  Yes.  with intent/plan  Homicidal Thoughts:  No  Memory:  Immediate;   Good Recent;   Fair Remote;   Good  Judgement:  Fair  Insight:  Good  Psychomotor Activity:  Normal  Concentration:  Good  Recall:  Good  Fund of Knowledge:Good  Language: Good  Akathisia:  Yes  Handed:  Right  AIMS (if indicated):   Not indicated  Assets:  Communication Skills Desire for Improvement Financial Resources/Insurance Housing Intimacy Leisure Time Physical  Health Resilience Social Support Talents/Skills Transportation Vocational/Educational  Sleep:  Number of Hours: 3.25   Musculoskeletal: Gait & Station: normal Patient leans: N/A  COGNITIVE FEATURES THAT CONTRIBUTE TO RISK:  Closed-mindedness    SUICIDE RISK:   Moderate:  Frequent suicidal ideation with limited intensity, and duration, some specificity in terms of plans, no associated intent, good self-control, limited dysphoria/symptomatology, some risk factors present, and identifiable protective factors, including available and accessible social support.  PLAN OF CARE: Treatment Plan/Recommendations: Plan: Review of chart, vital signs, medications, and notes.  1-Admit for crisis management and stabilization. Estimated length of stay 5-7 days past his current stay of one.  2-Individual and group therapy encouraged  3-Medication management for current symptoms to return to base line and improve the patient's overall level of functioning:  -Trial of cymbalta, discussed [employer] assist program to help patient get medication -Continue sertraline and taper if he tolerates cymbalta -Continue Wellbutrin XL 150 mg -Hydroxyzine 25 mg TID PRN, 50 mg PRN insomnia. 4-Coping skills for substance abuse, and anxiety developing--  5-Continue crisis stabilization and management  6-Address health issues--monitoring vital signs, stable  7-Treatment plan in progress to prevent relapse of alcohol abuse and anxiety  8-Psychosocial education regarding relapse prevention and self-care  8-Health care follow up as needed for any health concerns  9-Call for consult with hospitalist for additional specialty patient services as needed.   I certify that inpatient services furnished can reasonably be expected to improve the patient's condition.  Devin Crawford 08/07/2013, 11:07 AM

## 2013-08-07 NOTE — Progress Notes (Signed)
Psychoeducational Group Note   Date: 08/07/2013 Time: 0930 Group Topic/Focus:  Gratefulness:  The focus of this group is to help patients identify what two things they are most grateful for in their lives. What helps ground them and to center them on their work to their recovery.  Participation Level:  Active  Participation Quality:  Appropriate  Affect:  Appropriate  Cognitive:  Oriented  Insight:  Improving  Engagement in Group:  Engaged  Additional Comments:    Evie Croston A   

## 2013-08-08 MED ORDER — DULOXETINE HCL 20 MG PO CPEP
40.0000 mg | ORAL_CAPSULE | Freq: Every day | ORAL | Status: DC
  Administered 2013-08-09 – 2013-08-10 (×2): 40 mg via ORAL
  Filled 2013-08-08: qty 2
  Filled 2013-08-08: qty 28
  Filled 2013-08-08 (×3): qty 2

## 2013-08-08 MED ORDER — SERTRALINE HCL 50 MG PO TABS
50.0000 mg | ORAL_TABLET | Freq: Every day | ORAL | Status: DC
  Administered 2013-08-09: 50 mg via ORAL
  Filled 2013-08-08 (×4): qty 1

## 2013-08-08 NOTE — Progress Notes (Signed)
Patient ID: Devin NeriBenjamin R Crawford, male   DOB: 1980-05-25, 33 y.o.   MRN: [id number] D-Patient reports he slept well and his appetite is good.  His energy leel is normal and his ability to pay attention is good.  He is rating his depression at 3/10 .  He denies thoughts of self harm.  He says that he plans to exercise, and not isolate when he goes home.  Supported patient.  R- patient is attending groups and he continues to have pain in his L pelvis area.

## 2013-08-08 NOTE — [facility] Group Notes (Signed)
[facility] LCSW Group Therapy          Overcoming Obstacles       1:15 -2:30        08/08/2013   2:49 PM     Type of Therapy:  Group Therapy  Participation Level:  Appropriate  Participation Quality:  Appropriate  Affect:  Appropriate, Alert  Cognitive:  Attentive Appropriate  Insight: Developing/Improving Engaged  Engagement in Therapy: Developing/Imprvoing Engaged  Modes of Intervention:  Discussion Exploration  Education Rapport BuildingProblem-Solving Support  Summary of Progress/Problems:  The main focus of today's group was overcoming obstacles.  He shared the obstacle he has to overcome is isolation.  He stated when he becomes depression he shuts down and puts on a mask.  He shared he has a good support system if he would take advantage of it.      Patient able to identify appropriate coping skills.   [name], Devin Crawford 08/08/2013   2:49 PM
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
## 2013-08-08 NOTE — Tx Team (Addendum)
Interdisciplinary Treatment Plan Update   Date Reviewed:  08/08/2013  Time Reviewed:  9:36 AM  Progress in Treatment:   Attending groups: Yes Participating in groups: Yes Taking medication as prescribed: Yes  Tolerating medication: Yes Family/Significant other contact made:  No, but will ask patient for consent for collateral contact Patient understands diagnosis: Yes  Discussing patient identified problems/goals with staff: Yes Medical problems stabilized or resolved: Yes Denies suicidal/homicidal ideation: Yes Patient has not harmed self or others: Yes  For review of initial/current patient goals, please see plan of care.  Estimated Length of Stay:  3-4 days  Reasons for Continued Hospitalization:  Anxiety Depression Medication stabilization  New Problems/Goals identified:    Discharge Plan or Barriers:   Home with outpatient follow up with [facility]  Additional Comments:  Devin Crawford is an 33 y.o. male [facility]. Pt presents with SI with plan to poison himself by carbon monoxide, by unhooking the gas line to the apartment. Pt stated he has been having ongoing SI, but woke up an told his partner this AM that he had a plan. Pt stated that he feels that [facility] weren't working and that he has been feeling depressed and anxious. He also reported that due to his depression, he has been abusing his prescribed pain medications (Fentanyl, Tramadol, and Oxycodone). Pt reported he has not had any pain medications in 2 days and that he feels nauseous, sweaty, and shaky. Pt states he woke yesterday and realized he had taken to many of his pain medications and this lead to his increased depression   Attendees:  Patient:  08/08/2013 9:36 AM   Signature: Mervyn GayJ. Jonnalagadda, MD 08/08/2013 9:36 AM  Signature:   08/08/2013 9:36 AM  Signature:  Devin Headonrad Withrow, NP 08/08/2013 9:36 AM  Signature:  Devin Brochureawnally Dax, RN 08/08/2013 9:36  AM  Signature:  Devin Loftarol Davis RN 08/08/2013 9:36 AM  Signature:  Devin PatchQuylle Shaylea Ucci, LCSW 08/08/2013 9:36 AM  Signature:  Devin RasmussenHannah Coble, LCSW 08/08/2013 9:36 AM  Signature:  Devin Crawford, Care Coordinator [facility] 08/08/2013 9:36 AM  Signature:  Devin GellKrista Dopson, RN 08/08/2013 9:36 AM  Signature:  08/08/2013  9:36 AM  Signature:   Devin BoerJennifer Clark, RN [facility] 08/08/2013  9:36 AM  Signature:   08/08/2013  9:36 AM    Scribe for Treatment Team:   Devin Crawford,  08/08/2013 9:36 AM

## 2013-08-08 NOTE — Progress Notes (Signed)
Adult Psychoeducational Group Note  Date:  08/08/2013 Time:  10:27 PM  Group Topic/Focus:  Wrap-Up Group:   The focus of this group is to help patients review their daily goal of treatment and discuss progress on daily workbooks.  Participation Level:  Active  Participation Quality:  Appropriate  Affect:  Appropriate  Cognitive:  Appropriate  Insight: Appropriate  Engagement in Group:  Engaged  Modes of Intervention:  Support  Additional Comments:  Pt stated that one positive thing that happened today was that he had a visiter today.   Devin Crawford 08/08/2013, 10:27 PM

## 2013-08-08 NOTE — [facility] Group Notes (Signed)
[facility]   08/08/2013 11:02 AM    Participation Quality:  Appropraite  Mood/Affect:  Appropriate  Depression Rating:  4  Anxiety Rating:  6  Thoughts of Suicide:  No  Will you contract for safety?   NA  Current AVH:  No  Plan for Discharge/Comments:  Patient attended discharge planning group and actively participated in group.  He [facility] but did not like their service and has not returned for treatment.  [facility]  CSW provided all participants with daily workbook.   Transportation Means: Patient has transportation.   Supports:  Patient has a support system.   Terren Haberle, Joesph JulyQuylle Hairston

## 2013-08-09 DIAGNOSIS — R45851 Suicidal ideations: Secondary | ICD-10-CM

## 2013-08-09 DIAGNOSIS — F1994 Other psychoactive substance use, unspecified with psychoactive substance-induced mood disorder: Secondary | ICD-10-CM

## 2013-08-09 DIAGNOSIS — F332 Major depressive disorder, recurrent severe without psychotic features: Principal | ICD-10-CM

## 2013-08-09 DIAGNOSIS — F4310 Post-traumatic stress disorder, unspecified: Secondary | ICD-10-CM

## 2013-08-09 NOTE — Progress Notes (Signed)
Patient ID: Devin NeriBenjamin R Crawford, male   DOB: 1981-03-30, 33 y.o.   MRN: [id number] [facility]  08/09/2013 4:32 PM Devin NeriBenjamin R Crawford  MRN:  [id number] Subjective:  Devin NeriBenjamin R Navejas is an 33 y.o. male [facility] with suicidal ideation and plan to poison himself by carbon monoxide, by unhooking the gas line to the apartment. Pt stated he has been having ongoing SI, but woke up an told his partner this AM that he had a plan. Pt stated that he feels that [facility] weren't working and that he has been feeling depressed and anxious. He also reported that due to his depression, he has been abusing his prescribed pain medications (Fentanyl, Tramadol, and Oxycodone). Pt reported he has not had any pain medications in 2 days and that he feels nauseous, sweaty, and shaky. Pt states he woke yesterday and realized he had taken to many of his pain medications and this lead to his increased depression. He also states that he has to deal with taking this medication everyday for the rest of his life. He feels as though he doesn't consider it substance abuse he took his pain medication because he is in pain. Pt denies HI or psychosis. [facility] [facility], but stated he did not follow up with them. Pt cannot identify any particular triggers, just stated he feels more depressed. Pt lives with his partner and he is supportive, as is pt's mother by pt report. He reports not sleeping for the last 4 days, and is requesting for something to help him sleep. He currently rates his depression at about 6/10, and anxiety 7/10.  During today's assessment, pt rates anxiety at 5/10 and depression at 1/10. Pt states that he is very happy with his medication regimen and is not feeling side effects at this time. Pt denies SI, HI, and AVH, contracts for safety. Pt reports that he is feeling a lot of anxiety about "being here in  this strange place with these strange people" and that this is his main stressor. Pt states that if he thinks about going home, he feels much better and his anxiety drops significantly.    Diagnosis:   DSM5: Schizophrenia Disorders:   Obsessive-Compulsive Disorders:   Trauma-Stressor Disorders:   Substance/Addictive Disorders:   Depressive Disorders:   Total Time spent with patient: 30 minutes  Axis I: Major Depression, Recurrent severe, Post Traumatic Stress Disorder and Substance Induced Mood Disorder  ADL's:  Intact  Sleep: Fair  Appetite:  Fair  Suicidal Ideation:  Patient endorses suicidal ideation with plan to contract for safety while in the hospital  Homicidal Ideation:  Denied  AEB (as evidenced by):  Psychiatric Specialty Exam: Physical Exam  Review of Systems  Constitutional: Negative.   HENT: Negative.   Eyes: Negative.   Respiratory: Negative.   Cardiovascular: Negative.   Gastrointestinal: Negative.   Genitourinary: Negative.   Musculoskeletal: Negative.   Skin: Negative.   Neurological: Negative.   Endo/Heme/Allergies: Negative.   Psychiatric/Behavioral: Positive for depression. The patient is nervous/anxious.     Blood pressure 118/84, pulse 77, temperature 97.4 F (36.3 C), temperature source Oral, resp. rate 18, height 5\' 8"  (1.727 m), weight 90.266 kg (199 lb).Body mass index is 30.26 kg/(m^2).  General Appearance: Casual  Eye Contact::  Fair  Speech:  Clear and Coherent  Volume:  Normal  Mood:  Anxious  Affect:  Appropriate  Thought Process:  Goal Directed and Intact  Orientation:  Full (Time, Place, and Person)  Thought Content:  WDL  Suicidal Thoughts:  No  Homicidal Thoughts:  No  Memory:  Immediate;   Fair  Judgement:  Intact  Insight:  Fair  Psychomotor Activity:  Normal  Concentration:  Fair  Recall:  [employer] of Knowledge:Good  Language: Good  Akathisia:  NA  Handed:  Right  AIMS (if indicated):     Assets:  Communication  Skills Desire for Improvement Housing Intimacy Leisure Time Physical Health Resilience Social Support Talents/Skills  Sleep:  Number of Hours: 6.5   Musculoskeletal: Strength & Muscle Tone: within normal limits Gait & Station: normal Patient leans: N/A  Current Medications: Current Facility-Administered Medications  Medication Dose Route Frequency Provider Last Rate Last Dose  . acetaminophen (TYLENOL) tablet 650 mg  650 mg Oral Q6H PRN Kristeen Mans, NP   650 mg at 08/08/13 1527  . alum & mag hydroxide-simeth (MAALOX/MYLANTA) 200-200-20 MG/5ML suspension 30 mL  30 mL Oral Q4H PRN Kristeen Mans, NP      . buPROPion (WELLBUTRIN XL) 24 hr tablet 150 mg  150 mg Oral Daily Kristeen Mans, NP   150 mg at 08/09/13 0749  . DULoxetine (CYMBALTA) DR capsule 40 mg  40 mg Oral Daily Nehemiah Settle, MD   40 mg at 08/09/13 0749  . elvitegravir-cobicistat-emtricitabine-tenofovir (STRIBILD) 150-150-200-300 MG tablet 1 tablet  1 tablet Oral Q breakfast Kristeen Mans, NP   1 tablet at 08/09/13 0749  . fentaNYL (DURAGESIC - dosed mcg/hr) patch 25 mcg  25 mcg Transdermal NOW Nehemiah Settle, MD   25 mcg at 08/06/13 2324  . fentaNYL (DURAGESIC - dosed mcg/hr) patch 25 mcg  25 mcg Transdermal Q48H Nehemiah Settle, MD   25 mcg at 08/08/13 2132  . gemfibrozil (LOPID) tablet 600 mg  600 mg Oral BID AC Kristeen Mans, NP   600 mg at 08/09/13 1610  . hydrOXYzine (ATARAX/VISTARIL) tablet 25 mg  25 mg Oral Q6H PRN Kristeen Mans, NP   25 mg at 08/09/13 1125  . ibuprofen (ADVIL,MOTRIN) tablet 600 mg  600 mg Oral Q6H PRN Kristeen Mans, NP   600 mg at 08/09/13 1410  . magnesium hydroxide (MILK OF MAGNESIA) suspension 30 mL  30 mL Oral Daily PRN Kristeen Mans, NP      . multivitamin with minerals tablet 1 tablet  1 tablet Oral Daily Kristeen Mans, NP   1 tablet at 08/09/13 0749  . naproxen (NAPROSYN) tablet 250 mg  250 mg Oral BID WC Kristeen Mans, NP   250 mg at 08/09/13 0749  .  pantoprazole (PROTONIX) EC tablet 80 mg  80 mg Oral Daily Kristeen Mans, NP   80 mg at 08/09/13 0749  . ramelteon (ROZEREM) tablet 8 mg  8 mg Oral QHS Kristeen Mans, NP   8 mg at 08/08/13 2149  . sertraline (ZOLOFT) tablet 50 mg  50 mg Oral Daily Nehemiah Settle, MD   50 mg at 08/09/13 0749  . zolpidem (AMBIEN) tablet 5 mg  5 mg Oral QHS PRN Truman Hayward, FNP   5 mg at 08/08/13 2149    Lab Results: No results found for this or any previous visit (from the past 48 hour(s)).  Physical Findings: AIMS: Facial and Oral Movements Muscles of Facial Expression: None, normal Lips and Perioral Area: None, normal Jaw: None, normal Tongue: None,  normal,Extremity Movements Upper (arms, wrists, hands, fingers): None, normal Lower (legs, knees, ankles, toes): None, normal, Trunk Movements Neck, shoulders, hips: None, normal, Overall Severity Severity of abnormal movements (highest score from questions above): None, normal Incapacitation due to abnormal movements: None, normal Patient's awareness of abnormal movements (rate only patient's report): No Awareness, Dental Status Current problems with teeth and/or dentures?: No Does patient usually wear dentures?: No  CIWA:    COWS:  COWS Total Score: 4  Treatment Plan Summary: Daily contact with patient to assess and evaluate symptoms and progress in treatment Medication management  Review of chart, vital signs, medications, and notes.  1-Individual and group therapy  2-Medication management for depression and anxiety: Medications reviewed with the patient and she stated no untoward effects, unchanged.  Continue cymbalta 40 mg  Continue Wellbutrin XL 150 mg  Continue Hydroxyzine 25 mg TID PRN  3-Coping skills for depression, anxiety  4-Continue crisis stabilization and management  5-Address health issues--monitoring vital signs, stable  6-Treatment plan in progress to prevent relapse of depression and anxiety    Medical Decision  Making Problem Points:  Established problem, stable/improving (1), New problem, with no additional work-up planned (3), Review of last therapy session (1) and Review of psycho-social stressors (1) Data Points:  Review or order clinical lab tests (1) Review or order medicine tests (1) Review of medication regiment & side effects (2) Review of new medications or change in dosage (2)  I certify that inpatient services furnished can reasonably be expected to improve the patient's condition.   Beau Fanny, FNP-BC 08/09/2013, 4:32 PM  Reviewed the information documented and agree with the treatment plan.  Audria Takeshita,JANARDHAHA R. 08/09/2013 6:23 PM

## 2013-08-09 NOTE — Progress Notes (Signed)
Pt observed in the dayroom talking with peers and watching TV.  Pt reports it has been a pretty good day.  He says he will probably discharge Wed to home.  He feels the meds are working for him and he denies SI/HI/AV.  He has been attending groups.  He says he especially liked the pet therapy today.  Pt makes his needs known to staff.  Pt voices no needs or concerns at this time.  Support and encouragement offered.  Safety maintained with q15 minute checks.

## 2013-08-09 NOTE — Progress Notes (Signed)
Patient ID: Devin NeriBenjamin R Crawford, male   DOB: 1981-05-19, 33 y.o.   MRN: [id number] D- Patient reports he slept well and his appetite is good. He is reporting normal energy and good ability to pay attention.  He denies thoughts of self harm and A- supported patient. R- patient says he feels irritated by some stafff behavior and he realizes that his criticism is escalating and he is looking for things to criticize. Began bringing up situations from past admission. Talked with patient about staying in the present and coping skills to use for addressing situation andmanaging feelings.  Patient agreed to try.

## 2013-08-09 NOTE — Progress Notes (Signed)
The focus of this group is to educate the patient on the purpose and policies of crisis stabilization and provide a format to answer questions about their admission.  The group details unit policies and expectations of patients while admitted. Patient attened group and talked about how being hee is hard work and 'it sucks the energy from you." Talked about problems with feeling critical of staff.  Was able to process 1:1 after group.

## 2013-08-09 NOTE — Progress Notes (Signed)
Adult Psychoeducational Group Note  Date:  08/09/2013 Time:  8:00PM Group Topic/Focus:  Wrap-Up Group:   The focus of this group is to help patients review their daily goal of treatment and discuss progress on daily workbooks.  Participation Level:  Active  Participation Quality:  Appropriate and Attentive  Affect:  Appropriate  Cognitive:  Alert and Appropriate  Insight: Appropriate  Engagement in Group:  Engaged  Modes of Intervention:  Discussion  Additional Comments:  Pt. Was attentive and appropriate during tonight's group discussion. Pt stated that he had a ok day today. Pt shared that pet therapy was something different that he enjoyed. Pt shared that he has a pet and plans of spending time communicating and not getting walked over.   Bing PlumeScott, Chrishauna Mee D 08/09/2013, 8:45 PM

## 2013-08-09 NOTE — [facility] Group Notes (Signed)
[facility] LCSW Group Therapy      Feelings About Diagnosis 1:15 - 2:30 PM         08/09/2013  3:27 PM    Type of Therapy:  Group Therapy  Participation Level:  Active  Participation Quality:  Appropriate  Affect:  Appropriate  Cognitive:  Alert and Appropriate  Insight:  Developing/Improving and Engaged  Engagement in Therapy:  Developing/Improving and Engaged  Modes of Intervention:  Discussion, Education, Exploration, Problem-Solving, Rapport Building, Support  Summary of Progress/Problems:  Patient actively participated in group. Patient discussed past and present diagnosis and the effects it has had on  life.  He shared he has had a lot of medical problems so his mental health diagnosis is not a problem for him.  Patient talked about family and society being judgmental and the stigma associated with having a mental health diagnosis.  Devin Crawford, Devin Crawford 08/09/2013  3:27 PM

## 2013-08-09 NOTE — Progress Notes (Signed)
Recreation Therapy Notes  Animal-Assisted Activity/Therapy (AAA/T) Program Checklist/Progress Notes Patient Eligibility Criteria Checklist & Daily Group note for Rec Tx Intervention  Date: 03.24.2015 Time: 2:45pm Location: [address] Dayroom    AAA/T Program Assumption of Risk Form signed by Patient/ or Parent Legal Guardian yes  Patient is free of allergies or sever asthma yes  Patient reports no fear of animals yes  Patient reports no history of cruelty to animals yes   Patient understands his/her participation is voluntary yes  Patient washes hands before animal contact yes  Patient washes hands after animal contact yes  Behavioral Response: Engaged, Appropriate   Education: Hand Washing, Appropriate Animal Interaction   Education Outcome: Acknowledges understanding  Clinical Observations/Feedback: Patient actively engaged in session, petting therapy dog appropriately and observing peer interaction with therapy dog.   Marykay Lexenise L Luismiguel Lamere, LRT/CTRS  Pilot Prindle L 08/09/2013 4:55 PM

## 2013-08-09 NOTE — Progress Notes (Signed)
Pt reports he has had a good day.  He says he went to groups and participated.  He makes his needs known to staff.  He has been in the dayroom watching TV and talking with his peers.  Pt denies SI/HI/AV at this time.  Pt plans to return home at discharge.  He is hopeful that the MD will find the right combination of medications for him.  Pt is cooperative with staff.  Support and encouragement offered.  Safety maintained with q15 minute checks.

## 2013-08-10 DIAGNOSIS — F411 Generalized anxiety disorder: Secondary | ICD-10-CM

## 2013-08-10 MED ORDER — GEMFIBROZIL 600 MG PO TABS: 600.0000 mg | ORAL_TABLET | Freq: Two times a day (BID) | ORAL | Status: DC

## 2013-08-10 MED ORDER — OMEPRAZOLE 40 MG PO CPDR: 40.0000 mg | DELAYED_RELEASE_CAPSULE | Freq: Every day | ORAL | Status: DC

## 2013-08-10 MED ORDER — FENTANYL 25 MCG/HR TD PT72: 25.0000 ug | MEDICATED_PATCH | TRANSDERMAL | Status: DC

## 2013-08-10 MED ORDER — NAPROXEN SODIUM 220 MG PO TABS: 440.0000 mg | ORAL_TABLET | Freq: Two times a day (BID) | ORAL | Status: DC

## 2013-08-10 MED ORDER — ELVITEG-COBIC-EMTRICIT-TENOFDF 150-150-200-300 MG PO TABS: 1.0000 | ORAL_TABLET | Freq: Every day | ORAL | Status: DC

## 2013-08-10 MED ORDER — BUPROPION HCL ER (XL) 150 MG PO TB24: 150.0000 mg | ORAL_TABLET | Freq: Every day | ORAL | Status: DC

## 2013-08-10 MED ORDER — RAMELTEON 8 MG PO TABS: 8.0000 mg | ORAL_TABLET | Freq: Every day | ORAL | Status: DC

## 2013-08-10 MED ORDER — DULOXETINE HCL 20 MG PO CPEP: 40.0000 mg | ORAL_CAPSULE | Freq: Every day | ORAL | Status: DC

## 2013-08-10 NOTE — [facility] Suicide Risk Assessment (Signed)
   Demographic Factors:  Male, Adolescent or young adult, Caucasian, Low socioeconomic status and Unemployed  Total Time spent with patient: 30 minutes  Psychiatric Specialty Exam: Physical Exam  ROS  Blood pressure 129/86, pulse 73, temperature 97.5 F (36.4 C), temperature source Oral, resp. rate 18, height 5\' 8"  (1.727 m), weight 90.266 kg (199 lb).Body mass index is 30.26 kg/(m^2).  General Appearance: Casual  Eye Contact::  Good  Speech:  Clear and Coherent  Volume:  Normal  Mood:  Depressed  Affect:  Appropriate and Congruent  Thought Process:  Coherent and Goal Directed  Orientation:  Full (Time, Place, and Person)  Thought Content:  WDL  Suicidal Thoughts:  No  Homicidal Thoughts:  No  Memory:  Immediate;   Good  Judgement:  Fair  Insight:  Fair  Psychomotor Activity:  Normal  Concentration:  Good  Recall:  Good  Fund of Knowledge:Good  Language: Good  Akathisia:  NA  Handed:  Right  AIMS (if indicated):     Assets:  Communication Skills Desire for Improvement Financial Resources/Insurance Housing Intimacy Leisure Time Physical Health Resilience Social Support Talents/Skills Transportation  Sleep:  Number of Hours: 5    Musculoskeletal: Strength & Muscle Tone: within normal limits Gait & Station: normal Patient leans: N/A   Mental Status Per Nursing Assessment::   On Admission:  Suicidal ideation indicated by patient  Current Mental Status by Physician: NA  Loss Factors: Financial problems/change in socioeconomic status  Historical Factors: Family history of mental illness or substance abuse and Impulsivity  Risk Reduction Factors:   Sense of responsibility to family, Religious beliefs about death, Living with another person, especially a relative, Positive social support, Positive therapeutic relationship and Positive coping skills or problem solving skills  Continued Clinical Symptoms:  Depression:   Impulsivity Recent sense of  peace/wellbeing Unstable or Poor Therapeutic Relationship Previous Psychiatric Diagnoses and Treatments  Cognitive Features That Contribute To Risk:  Polarized thinking    Suicide Risk:  Minimal: No identifiable suicidal ideation.  Patients presenting with no risk factors but with morbid ruminations; may be classified as minimal risk based on the severity of the depressive symptoms  Discharge Diagnoses:   AXIS I:  Major Depression, Recurrent severe AXIS II:  Deferred AXIS III:   Past Medical History  Diagnosis Date  . HIV (human immunodeficiency virus infection)   . Blood transfusion 2007  . Arthritis   . Kidney calculus   . Narcotic addiction   . Multiple pelvic fractures   . History of tonsillectomy   . hip fracture left     rod placement 04-2006  . History of spleen injury     removal 2007  . Mental disorder   . Depression    AXIS IV:  other psychosocial or environmental problems, problems related to social environment and problems with primary support group AXIS V:  61-70 mild symptoms  Plan Of Care/Follow-up recommendations:  Activity:  As tolerated Diet:  Regular  Is patient on multiple antipsychotic therapies at discharge:  No   Has Patient had three or more failed trials of antipsychotic monotherapy by history:  No  Recommended Plan for Multiple Antipsychotic Therapies: NA    Cyndia Degraff,JANARDHAHA R. 08/10/2013, 1:10 PM

## 2013-08-10 NOTE — Progress Notes (Signed)
Patient ID: Devin Crawford, male   DOB: [birth year], 33 y.o.   MRN: [id number]  Pt. Denies SI/HI and A/V hallucinations. Patient rates his depression and hopelessness at 1/10 for the day. Patient was experiencing anxiety and received PRN Vistaril. Upon reassessment he reported its effectiveness.  Belongings returned to patient at time of discharge. Patient denies any new onset of pain or discomfort. Discharge instructions and medications were reviewed with patient. Patient verbalized understanding of both medications and discharge instructions. Q15 minute safety checks maintained until discharge. No distress noted upon discharge.

## 2013-08-10 NOTE — [facility] Group Notes (Signed)
[facility] Dis[facility] Planning [facility] Note   08/10/2013 9:54 AM    Participation Quality:  Appropraite  Mood/Affect:  Appropriate  Depression Rating:  1  Anxiety Rating:  1  Thoughts of Suicide:  No  Will you contract for safety?   NA  Current AVH:  No  Plan for Discharge/Comments:  Patient attended discharge planning group and actively participated in group.  Patient to discharge to his home and [facility]  CSW provided all participants with daily workbook.   Transportation Means: Patient has transportation.   Supports:  Patient has a support system.   Devin Crawford, Devin Crawford

## 2013-08-10 NOTE — [facility] Suicide Risk Assessment (Signed)
[facility] INPATIENT:  Family/Significant Other Suicide Prevention Education  Suicide Prevention Education:  Education Completed; Devin Crawford, Mother- [phone];  has been identified by the patient as the family member/significant other with whom the patient will be residing, and identified as the person(s) who will aid the patient in the event of a mental health crisis (suicidal ideations/suicide attempt).  With written consent from the patient, the family member/significant other has been provided the following suicide prevention education, prior to the and/or following the discharge of the patient.  The suicide prevention education provided includes the following:  Suicide risk factors  Suicide prevention and interventions  National Suicide Hotline telephone number  [facility] assessment telephone number  [facility] Emergency Assistance 911  [facility] and/or Residential Mobile Crisis Unit telephone number  Request made of family/significant other to:  Remove weapons (e.g., guns, rifles, knives), all items previously/currently identified as safety concern.  Mother advised patient does not have access to guns.  Remove drugs/medications (over-the-counter, prescriptions, illicit drugs), all items previously/currently identified as a safety concern.  The family member/significant other verbalizes understanding of the suicide prevention education information provided.  The family member/significant other agrees to remove the items of safety concern listed above.  [name], Soo Steelman Hairston 08/10/2013, 9:35 AM

## 2013-08-10 NOTE — Discharge Summary (Signed)
Physician Discharge Summary Note  Patient:  Devin Crawford is an 33 y.o., male MRN:  [id number] DOB:  Aug 19, 1980 Patient phone:  [phone] (home)  Patient address:   [address] [place] [address],  Total Time spent with patient: 30 minutes  Date of Admission:  08/06/2013 Date of Discharge: 08/10/2013  Reason for Admission:  Depression with suicidal ideation and plan.  Discharge Diagnoses: Schizophrenia Disorders: None  Obsessive-Compulsive Disorders: None  Trauma-Stressor Disorders: Posttraumatic Stress Disorder (309.81)  Substance/Addictive Disorders:None  Depressive Disorders: Major Depressive Disorder - Severe (296.23)  AXIS I: Anxiety Disorder NOS, Major Depression, Recurrent severe and Post Traumatic Stress Disorder  AXIS II: Deferred  AXIS III:  Past Medical History   Diagnosis  Date   .  HIV (human immunodeficiency virus infection)    .  Blood transfusion  2007   .  Arthritis    .  Kidney calculus    .  Narcotic addiction    .  Multiple pelvic fractures    .  History of tonsillectomy    .  hip fracture left      rod placement 04-2006   .  History of spleen injury      removal 2007   .  Mental disorder    .  Depression     AXIS IV: economic problems, occupational problems, problems related to social environment, problems with access to health care services and medical conditions  AXIS V: 41-50 serious symptoms Psychiatric Specialty Exam: Physical Exam  Constitutional: He appears well-developed and well-nourished.    ROS  Blood pressure 129/86, pulse 73, temperature 97.5 F (36.4 C), temperature source Oral, resp. rate 18, height 5\' 8"  (1.727 m), weight 199 lb (90.266 kg).Body mass index is 30.26 kg/(m^2).  General Appearance: Casual  Eye Contact::  Good  Speech:  Clear and Coherent  Volume:  Normal  Mood:  Euthymic  Affect:  Congruent  Thought Process:  Goal Directed  Orientation:  Full (Time, Place, and Person)  Thought Content:   WDL  Suicidal Thoughts:  No  Homicidal Thoughts:  No  Memory:  NA  Judgement:  Intact  Insight:  Present  Psychomotor Activity:  Normal  Concentration:  Good  Recall:  NA  Fund of Knowledge:Fair  Language: Good  Akathisia:  No  Handed:  Right  AIMS (if indicated):     Assets:  Communication Skills Desire for Improvement  Sleep:  Number of Hours: 5    Past Psychiatric History: Diagnosis:  Hospitalizations:  Outpatient Care:  Substance Abuse Care:  Self-Mutilation:  Suicidal Attempts:  Violent Behaviors:   Musculoskeletal: Strength & Muscle Tone: within normal limits Gait & Station: normal Patient leans: N/A  DSM5: Schizophrenia Disorders: None  Obsessive-Compulsive Disorders: None  Trauma-Stressor Disorders: Posttraumatic Stress Disorder (309.81)  Substance/Addictive Disorders:None  Depressive Disorders: Major Depressive Disorder - Severe (296.23)  AXIS I: Anxiety Disorder NOS, Major Depression, Recurrent severe and Post Traumatic Stress Disorder  AXIS II: Deferred  AXIS III:  Past Medical History   Diagnosis  Date   .  HIV (human immunodeficiency virus infection)    .  Blood transfusion  2007   .  Arthritis    .  Kidney calculus    .  Narcotic addiction    .  Multiple pelvic fractures    .  History of tonsillectomy    .  hip fracture left      rod placement 04-2006   .  History of spleen injury  removal 2007   .  Mental disorder    .  Depression     AXIS IV: economic problems, occupational problems, problems related to social environment, problems with access to health care services and medical conditions  AXIS V: 41-50 serious symptoms Level of Care:  OP  Hospital Course:      Devin NeriBenjamin R Crawford is an 33 y.o. male that [facility]. Pt presents with SI with plan to poison himself by carbon monoxide, by unhooking the gas line to the apartment. Pt stated he has been having ongoing SI, but woke up an told his partner this AM that he had  a plan.   He also reported substance abuse from his chronic pain meds and reported feeling nauseated, sweaty and shakey as he had been out of his pain meds for 2 days.    He rated his depression 6/10 and his anxiety as a 7/10.  He was evaluated and felt to be in need of in patient psychiatric hospitalization for crisis management and stabilization.     He was evaluated by a psychiatrist upon admission and medication management was initiated. He reported that his symptoms were poor sleep, feelings of guilt, having to take medications everyday, worsening depression, fatigue, worthlessness and guilt, weight gain, and suicidal thoughts with a plan.      Devin NeriBenjamin R Crawford was admitted to the unit and evaluated. The symptoms were identified as worsening depression, anhedonia, decreased sleep, poor appetite, feelings of guilt and worthlessness, helplessness and hopelessness, increased fatigue, crying, anhedonia, guilt, isolation, difficulty concentrating, irritability, mood swings, increased anxiety,        The patient was oriented to the unit and encouraged to participate in unit programming. Medical problems were identified and treated appropriately. Home medication was restarted as needed. Psychiatric medication management was initiated.        The patient was evaluated each day by a clinical provider to ascertain the patient's response to treatment.  Improvement was noted by the patient's report of decreasing symptoms, improved sleep and appetite, affect, medication tolerance, behavior, and participation in unit programming.       He was asked each day to complete a self inventory noting mood, mental status, pain, new symptoms, anxiety and concerns.         He responded well to medication and being in a therapeutic and supportive environment. Positive and appropriate behavior was noted and the patient was motivated for recovery. He worked closely with the treatment team and case manager to develop a discharge  plan with appropriate goals. Coping skills, problem solving as well as relaxation therapies were also part of the unit programming.         By the day of discharge the patient was in much improved condition than upon admission.  Symptoms were reported as significantly decreased or resolved completely.  The patient denied SI/HI and voiced no AVH. He was motivated to continue taking medication with a goal of continued improvement in mental health.  Sharlet SalinaBenjamin was discharged home with a plan to follow up as noted below. Consults:  None  Significant Diagnostic Studies:  labs: reviewed  Discharge Vitals:   Blood pressure 129/86, pulse 73, temperature 97.5 F (36.4 C), temperature source Oral, resp. rate 18, height 5\' 8"  (1.727 m), weight 199 lb (90.266 kg). Body mass index is 30.26 kg/(m^2). Lab Results:   No results found for this or any previous visit (from the past 72 hour(s)).  Physical Findings: AIMS: Facial  and Oral Movements Muscles of Facial Expression: None, normal Lips and Perioral Area: None, normal Jaw: None, normal Tongue: None, normal,Extremity Movements Upper (arms, wrists, hands, fingers): None, normal Lower (legs, knees, ankles, toes): None, normal, Trunk Movements Neck, shoulders, hips: None, normal, Overall Severity Severity of abnormal movements (highest score from questions above): None, normal Incapacitation due to abnormal movements: None, normal Patient's awareness of abnormal movements (rate only patient's report): No Awareness, Dental Status Current problems with teeth and/or dentures?: No Does patient usually wear dentures?: No  CIWA:    COWS:  COWS Total Score: 4  Psychiatric Specialty Exam: See Psychiatric Specialty Exam and Suicide Risk Assessment completed by Attending Physician prior to discharge.  Discharge destination:  Home  Is patient on multiple antipsychotic therapies at discharge:  No   Has Patient had three or more failed trials of antipsychotic  monotherapy by history:  No  Recommended Plan for Multiple Antipsychotic Therapies: NA  Discharge Orders   Future Appointments Provider Department Dept Phone   08/11/2013 1:45 PM Judyann Munson, MD [facility] for Infectious Disease [phone]   Future Orders Complete By Expires   Diet - low sodium heart healthy  As directed    Discharge instructions  As directed    Comments:     Take all of your medications as directed. Be sure to keep all of your follow up appointments.  If you are unable to keep your follow up appointment, call your Doctor's office to let them know, and reschedule.  Make sure that you have enough medication to last until your appointment. Be sure to get plenty of rest. Going to bed at the same time each night will help. Try to avoid sleeping during the day.  Increase your activity as tolerated. Regular exercise will help you to sleep better and improve your mental health. Eating a heart healthy diet is recommended. Try to avoid salty or fried foods. Be sure to avoid all alcohol and illegal drugs.  Follow up with your PCP for your regular prescription medications.   Increase activity slowly  As directed        Medication List    STOP taking these medications       acetaminophen 500 MG tablet  Commonly known as:  TYLENOL     ALPRAZolam 1 MG tablet  Commonly known as:  XANAX     ibuprofen 200 MG tablet  Commonly known as:  ADVIL,MOTRIN     multivitamin with minerals Tabs tablet     oxyCODONE 5 MG immediate release tablet  Commonly known as:  Oxy IR/ROXICODONE     sertraline 100 MG tablet  Commonly known as:  ZOLOFT     traMADol 50 MG tablet  Commonly known as:  ULTRAM      TAKE these medications     Indication   buPROPion 150 MG 24 hr tablet  Commonly known as:  WELLBUTRIN XL  Take 1 tablet (150 mg total) by mouth daily. For depression.   Indication:  Major Depressive Disorder, mood stabilization     DULoxetine 20 MG capsule   Commonly known as:  CYMBALTA  Take 2 capsules (40 mg total) by mouth daily. For depression.   Indication:  Major Depressive Disorder     elvitegravir-cobicistat-emtricitabine-tenofovir 150-150-200-300 MG Tabs tablet  Commonly known as:  STRIBILD  Take 1 tablet by mouth daily with breakfast. For HIV   Indication:  HIV Disease     fentaNYL 25 MCG/HR patch  Commonly known as:  DURAGESIC - dosed mcg/hr  Place 1 patch (25 mcg total) onto the skin every other day. For pain.   Indication:  Moderate to Severe Chronic Pain     gemfibrozil 600 MG tablet  Commonly known as:  LOPID  Take 1 tablet (600 mg total) by mouth 2 (two) times daily before a meal. For cholesterol and lipids.   Indication:  Type IV Hyperlipidemia     naproxen sodium 220 MG tablet  Commonly known as:  ANAPROX  Take 2 tablets (440 mg total) by mouth 2 (two) times daily with a meal. For pain.      omeprazole 40 MG capsule  Commonly known as:  PRILOSEC  Take 1 capsule (40 mg total) by mouth daily. For GERD.   Indication:  Gastroesophageal Reflux Disease with Current Symptoms     ramelteon 8 MG tablet  Commonly known as:  ROZEREM  Take 1 tablet (8 mg total) by mouth at bedtime. For insomnia.   Indication:  Trouble Sleeping           Follow-up Information   Follow up with [facility] On 08/11/2013. (Please [facility] to [facility] on Thursday, August 12, 2013 or any weekday between 8AM- 12 Noon for medication management.)    Contact information:   [address] [place], [place]   [address]  [phone]      Follow-up recommendations:  Activities: Resume activity as tolerated. Diet: Heart healthy low sodium diet Tests: Follow up testing will be determined by your out patient provider.  Comments:    Total Discharge Time:  Less than 30 minutes.  Signed: MASHBURN,NEIL 08/10/2013, 11:05 AM  Patient was seen personally for psych evaluation, suicide risk assessment and case discussed with  physician extender and formulated treatment plan. Reviewed the information documented and agree with the treatment plan.  Keanu Frickey,JANARDHAHA R. 08/10/2013 4:43 PM

## 2013-08-10 NOTE — Progress Notes (Signed)
[facility]charge [facility] :  Will you be returning to the same living situation after discharge: Yes,  Patient to return to his home. At discharge, do you have transportation home?:Yes,  Patient to return to his home. Do you have the ability to pay for your medications:No.  Patient needs assistance with indigent medications   Release of information consent forms completed and in the chart;  Patient's signature needed at discharge.  Patient to Follow up at: Follow-up Information   [facility] (Please [facility] to [facility] on Thursday, August 12, 2013 or any weekday between 8AM- 12 Noon for medication management.)    Contact information:   [address] [place], [place]   [id number]  [phone]      Patient denies SI/HI:  Patient no longer endorsing SI/HI or other thoughts of self harm.   Safety Planning and Suicide Prevention discussed:  .Reviewed with all patients during discharge planning group   Lillia Lengel Hairston 08/10/2013, 11:00 AM

## 2013-08-10 NOTE — Tx Team (Addendum)
Interdisciplinary Treatment Plan Update   Date Reviewed:  08/10/2013  Time Reviewed:  9:38 AM  Progress in Treatment:   Attending groups: Yes Participating in groups: Yes Taking medication as prescribed: Yes  Tolerating medication: Yes Family/Significant other contact made:  Yes, collateral contact with mother. Patient understands diagnosis: Yes  Discussing patient identified problems/goals with staff: Yes Medical problems stabilized or resolved: Yes Denies suicidal/homicidal ideation: Yes Patient has not harmed self or others: Yes  For review of initial/current patient goals, please see plan of care.  Estimated Length of Stay: Discharge today  Reasons for Continued Hospitalization:    New Problems/Goals identified:    Discharge Plan or Barriers:   Home with outpatient follow up with Family Services  Additional Comments:    Attendees:  Patient:  Devin Crawford 08/10/2013 9:38 AM   Signature: Mervyn GayJ. Jonnalagadda, MD 08/10/2013 9:38 AM  Signature:   08/10/2013 9:38 AM  Signature:  Verne SpurrNeil Mashburn, RN  08/10/2013 9:38 AM  Signature:  08/10/2013 9:38 AM  Signature:   08/10/2013 9:38 AM  Signature:  Juline PatchQuylle Nikole Swartzentruber, LCSW 08/10/2013 9:38 AM  Signature:  Mordecai RasmussenHannah Coble, LCSW 08/10/2013 9:38 AM  Signature:  Leisa LenzValerie Enoch, Care Coordinator [facility] 08/10/2013 9:38 AM  Signature:  Harold Barbanonecia Byrd, RN 08/10/2013 9:38 AM  Signature:  08/10/2013  9:38 AM  Signature:   Onnie BoerJennifer Clark, RN [facility] 08/10/2013  9:38 AM  Signature:   08/10/2013  9:38 AM    Scribe for Treatment Team:   Juline PatchQuylle Aulani Shipton,  08/10/2013 9:38 AM

## 2013-08-11 ENCOUNTER — Encounter: Payer: Self-pay | Admitting: Internal Medicine

## 2013-08-11 ENCOUNTER — Ambulatory Visit (INDEPENDENT_AMBULATORY_CARE_PROVIDER_SITE_OTHER): Payer: Self-pay | Admitting: Internal Medicine

## 2013-08-11 VITALS — BP 141/92 | HR 106 | Temp 97.2°F | Wt 200.0 lb

## 2013-08-11 DIAGNOSIS — N50819 Testicular pain, unspecified: Secondary | ICD-10-CM

## 2013-08-11 DIAGNOSIS — H538 Other visual disturbances: Secondary | ICD-10-CM

## 2013-08-11 DIAGNOSIS — K6289 Other specified diseases of anus and rectum: Secondary | ICD-10-CM

## 2013-08-11 DIAGNOSIS — N509 Disorder of male genital organs, unspecified: Secondary | ICD-10-CM

## 2013-08-11 DIAGNOSIS — B20 Human immunodeficiency virus [HIV] disease: Secondary | ICD-10-CM

## 2013-08-11 DIAGNOSIS — Z113 Encounter for screening for infections with a predominantly sexual mode of transmission: Secondary | ICD-10-CM

## 2013-08-11 DIAGNOSIS — F4323 Adjustment disorder with mixed anxiety and depressed mood: Secondary | ICD-10-CM

## 2013-08-11 LAB — COMPLETE METABOLIC PANEL WITH GFR
ALT: 33 U/L (ref 0–53)
AST: 23 U/L (ref 0–37)
Albumin: 5 g/dL (ref 3.5–5.2)
Alkaline Phosphatase: 127 U/L — ABNORMAL HIGH (ref 39–117)
BUN: 15 mg/dL (ref 6–23)
CALCIUM: 10.1 mg/dL (ref 8.4–10.5)
CHLORIDE: 104 meq/L (ref 96–112)
CO2: 29 meq/L (ref 19–32)
CREATININE: 1.21 mg/dL (ref 0.50–1.35)
GFR, EST NON AFRICAN AMERICAN: 80 mL/min
GLUCOSE: 84 mg/dL (ref 70–99)
Potassium: 4.4 mEq/L (ref 3.5–5.3)
Sodium: 143 mEq/L (ref 135–145)
Total Bilirubin: 0.4 mg/dL (ref 0.2–1.2)
Total Protein: 7.3 g/dL (ref 6.0–8.3)

## 2013-08-11 LAB — CBC WITH DIFFERENTIAL/PLATELET
Basophils Absolute: 0.1 10*3/uL (ref 0.0–0.1)
Basophils Relative: 1 % (ref 0–1)
EOS ABS: 0.2 10*3/uL (ref 0.0–0.7)
EOS PCT: 2 % (ref 0–5)
HCT: 43.6 % (ref 39.0–52.0)
HEMOGLOBIN: 15 g/dL (ref 13.0–17.0)
LYMPHS ABS: 5.7 10*3/uL — AB (ref 0.7–4.0)
LYMPHS PCT: 54 % — AB (ref 12–46)
MCH: 31.6 pg (ref 26.0–34.0)
MCHC: 34.4 g/dL (ref 30.0–36.0)
MCV: 92 fL (ref 78.0–100.0)
Monocytes Absolute: 0.7 10*3/uL (ref 0.1–1.0)
Monocytes Relative: 7 % (ref 3–12)
Neutro Abs: 3.8 10*3/uL (ref 1.7–7.7)
Neutrophils Relative %: 36 % — ABNORMAL LOW (ref 43–77)
PLATELETS: 450 10*3/uL — AB (ref 150–400)
RBC: 4.74 MIL/uL (ref 4.22–5.81)
RDW: 15.4 % (ref 11.5–15.5)
WBC: 10.5 10*3/uL (ref 4.0–10.5)

## 2013-08-11 MED ORDER — VALACYCLOVIR HCL 1 G PO TABS: 1000.0000 mg | ORAL_TABLET | Freq: Two times a day (BID) | ORAL | Status: DC

## 2013-08-11 MED ORDER — CLONAZEPAM 1 MG PO TABS: 1.0000 mg | ORAL_TABLET | Freq: Two times a day (BID) | ORAL | Status: DC

## 2013-08-11 NOTE — Progress Notes (Signed)
Subjective:    Patient ID: [name] Carbonell, male    DOB: Jul 09, 1980, 33 y.o.   MRN: [id number]  HPI 33yo M with HIV well controlled with CD 4 count of 1140/VL<20, MDD, severe recurrence but also PTSD recently admitted to behaviorial health for suicide ideation, his antidepressants were changed to wellbutrin, cymbalta, and rozerum but they did not address PTSD. He  Comes to clinic still feeling his anxiety is much worse, had panic attack this morning, unable to concentrate, concerned about numerous things. Wondering if he can get xanax unable to afford primary care visit.  He has numerous health concerns at this visit. Leukocytosis on admit from behavioral health, left vision is worse, left testicular pain, and rectal pain difficult to sit as well as with defecation. No anal sex in 6 months, no rash, tender with bowel movement. No blood on toilet paper or blood streaked stool  Current Outpatient Prescriptions on File Prior to Visit  Medication Sig Dispense Refill  . buPROPion (WELLBUTRIN XL) 150 MG 24 hr tablet Take 1 tablet (150 mg total) by mouth daily. For depression.  30 tablet  0  . DULoxetine (CYMBALTA) 20 MG capsule Take 2 capsules (40 mg total) by mouth daily. For depression.  60 capsule  0  . elvitegravir-cobicistat-emtricitabine-tenofovir (STRIBILD) 150-150-200-300 MG TABS tablet Take 1 tablet by mouth daily with breakfast. For HIV  30 tablet  11  . fentaNYL (DURAGESIC - DOSED MCG/HR) 25 MCG/HR patch Place 1 patch (25 mcg total) onto the skin every other day. For pain.    0  . gemfibrozil (LOPID) 600 MG tablet Take 1 tablet (600 mg total) by mouth 2 (two) times daily before a meal. For cholesterol and lipids.  60 tablet  11  . naproxen sodium (ANAPROX) 220 MG tablet Take 2 tablets (440 mg total) by mouth 2 (two) times daily with a meal. For pain.      [name]. omeprazole (PRILOSEC) 40 MG capsule Take 1 capsule (40 mg total) by mouth daily. For GERD.      . ramelteon (ROZEREM) 8 MG tablet Take  1 tablet (8 mg total) by mouth at bedtime. For insomnia.  30 tablet  0   No current facility-administered medications on file prior to visit.   Active Ambulatory Problems    Diagnosis Date Noted  . Human immunodeficiency virus (HIV) disease 02/09/2012  . Restless leg syndrome 02/09/2012  . Anxiety 02/09/2012  . Monocular diplopia 02/14/2012  . H/O herpes zoster 02/14/2012  . Bursitis of hip 02/14/2012  . Severe recurrent major depression without psychotic features 05/28/2013  . Dyslipidemia 05/28/2013  . Hypertriglyceridemia 05/28/2013  . Polysubstance (including opioids) dependence without physiological dependence 05/28/2013  . Chronic pain syndrome 05/28/2013  . MDD (major depressive disorder), recurrent severe, without psychosis 08/06/2013   Resolved Ambulatory Problems    Diagnosis Date Noted  . Suicidal ideation 06/05/2011  . Depression, major 02/09/2012   Past Medical History  Diagnosis Date  . HIV (human immunodeficiency virus infection)   . Blood transfusion 2007  . Arthritis   . Kidney calculus   . Narcotic addiction   . Multiple pelvic fractures   . History of tonsillectomy   . hip fracture left   . History of spleen injury   . Mental disorder   . Depression       Review of Systems     Objective:   Physical Exam BP 141/92  Pulse 106  Temp(Src) 97.2 F (36.2 C) (Oral)  Wt 200  lb (90.719 kg) Physical Exam  Constitutional: He is oriented to person, place, and time. He appears well-developed and well-nourished. Appears anxious with fast speech somewhat tangential  HENT:  Mouth/Throat: Oropharynx is clear and moist. No oropharyngeal exudate.  Cardiovascular: Normal rate, regular rhythm and normal heart sounds. Exam reveals no gallop and no friction rub.  No murmur heard.  Pulmonary/Chest: Effort normal and breath sounds normal. No respiratory distress. He has no wheezes.  Abdominal: Soft. Bowel sounds are normal. He exhibits no distension. There is no  tenderness.  Lymphadenopathy:  He has no cervical adenopathy.  Neurological: He is alert and oriented to person, place, and time.  Skin: Skin is warm and dry. No rash noted. No erythema.  Psychiatric: anxious than his baseline Back = surgical scar on right flank Rectal = benign although firm sphincter control, unable to feel prostrate     Assessment & Plan:   anxiety/depression = seeing kenny. Will give klonopin 1mg  BID for 1 month until family service np to can assess need. Will not routinely refill as i have mentioned to him  Rectal pain = unclear etiology, no rash noticeable.  Will do miralax daily to minimize constipation pain. Do a trial of valtrex and anusol. If no improvement in 1-2 wk, will do swab to gc/chlam. Consider anal pap  Leukocytosis = will repeat cbc today  Blurry vision to left eye = has at baseline, but feels it is worsening. will refer to ophtho. i have mentioned that we do not have any program that does free eye exams or new frames/lens  Health maintenance = will apply orange card. Dental application

## 2013-08-12 LAB — T-HELPER CELL (CD4) - (RCID CLINIC ONLY)
CD4 T CELL HELPER: 20 % — AB (ref 33–55)
CD4 T Cell Abs: 1210 /uL (ref 400–2700)

## 2013-08-12 LAB — URINE CYTOLOGY ANCILLARY ONLY
Chlamydia: NEGATIVE
Neisseria Gonorrhea: NEGATIVE

## 2013-08-12 LAB — RPR

## 2013-08-14 LAB — HIV-1 RNA QUANT-NO REFLEX-BLD
HIV 1 RNA Quant: <20 copies/mL (ref <20)
HIV-1 RNA Quant, Log: <1.3 {Log} (ref <1.30)

## 2013-08-15 ENCOUNTER — Ambulatory Visit: Payer: Self-pay

## 2013-08-15 ENCOUNTER — Encounter: Payer: Self-pay | Admitting: Internal Medicine

## 2013-08-15 NOTE — ED Provider Notes (Signed)
Medical screening examination/treatment/procedure(s) were performed by non-physician practitioner and as supervising physician I was immediately available for consultation/collaboration.   EKG Interpretation None        Shanna CiscoMegan E Docherty, MD 08/15/13 2131

## 2013-08-15 NOTE — Progress Notes (Signed)
Patient Discharge Instructions:  After Visit Summary (AVS):   Faxed to:  08/15/13 Discharge Summary Note:   Faxed to:  08/15/13 Psychiatric Admission Assessment Note:   Faxed to:  08/15/13 Suicide Risk Assessment - Discharge Assessment:   Faxed to:  08/15/13 Faxed/Sent to the Next Level Care provider:  08/15/13 [facility] @ [phone]  Jerelene ReddenSheena E Willernie, 08/15/2013, 3:58 PM

## 2013-08-15 NOTE — Telephone Encounter (Signed)
Left message for pt to check his MYChart inbox for message from Dr. Drue SecondSnider to try OTC cream like Anusol.

## 2013-08-16 ENCOUNTER — Telehealth: Payer: Self-pay | Admitting: *Deleted

## 2013-08-16 NOTE — Telephone Encounter (Signed)
Please see the following: ____________________ That's wonderful news. :-) Thanks for touching base with me. You are the best ID Doctor anyone could ask for. Inge RiseBenjamin Denbleyker   ----- Message ----- From: Judyann MunsonSNIDER, CYNTHIA, MD Sent: 08/15/13 12:12 AM To: [name] Mccrone Subject: recent visit  [name], I hope you are feeling better since our last visit and can get hooked up with Bernette RedbirdKenny. Your repeat blood work is back to normal. Over the counter topical cream like anusal can help with the pain you were having. i will check in with you next week to see if it is still persisting, to decide another course of therapy. - Dr. Drue SecondSnider

## 2013-08-17 ENCOUNTER — Telehealth: Payer: Self-pay | Admitting: *Deleted

## 2013-08-17 NOTE — Telephone Encounter (Signed)
Called and left v/m for PaulineBenjamin.  I asked him to return my call concerning his needing help with getting his medication.

## 2013-08-19 ENCOUNTER — Encounter: Payer: Self-pay | Admitting: Internal Medicine

## 2013-09-03 ENCOUNTER — Encounter (HOSPITAL_COMMUNITY): Payer: Self-pay

## 2013-09-03 ENCOUNTER — Emergency Department (HOSPITAL_COMMUNITY)
Admission: EM | Admit: 2013-09-03 | Discharge: 2013-09-03 | Disposition: A | Discharge status: Home / Self Care | Payer: Self-pay | Attending: Emergency Medicine | Admitting: Emergency Medicine

## 2013-09-03 ENCOUNTER — Inpatient Hospital Stay (HOSPITAL_COMMUNITY)
Admission: AD | Admit: 2013-09-03 | Discharge: 2013-09-09 | DRG: 896 | Disposition: A | Discharge status: Home / Self Care | Payer: Federal, State, Local not specified - Other | Attending: Psychiatry | Admitting: Psychiatry

## 2013-09-03 ENCOUNTER — Encounter (HOSPITAL_COMMUNITY): Payer: Self-pay | Admitting: Emergency Medicine

## 2013-09-03 DIAGNOSIS — F489 Nonpsychotic mental disorder, unspecified: Secondary | ICD-10-CM | POA: Insufficient documentation

## 2013-09-03 DIAGNOSIS — Z9089 Acquired absence of other organs: Secondary | ICD-10-CM | POA: Insufficient documentation

## 2013-09-03 DIAGNOSIS — Z8781 Personal history of (healed) traumatic fracture: Secondary | ICD-10-CM | POA: Insufficient documentation

## 2013-09-03 DIAGNOSIS — K5900 Constipation, unspecified: Secondary | ICD-10-CM | POA: Diagnosis present

## 2013-09-03 DIAGNOSIS — B20 Human immunodeficiency virus [HIV] disease: Secondary | ICD-10-CM

## 2013-09-03 DIAGNOSIS — F1120 Opioid dependence, uncomplicated: Principal | ICD-10-CM

## 2013-09-03 DIAGNOSIS — Z791 Long term (current) use of non-steroidal anti-inflammatories (NSAID): Secondary | ICD-10-CM | POA: Insufficient documentation

## 2013-09-03 DIAGNOSIS — Z87891 Personal history of nicotine dependence: Secondary | ICD-10-CM | POA: Insufficient documentation

## 2013-09-03 DIAGNOSIS — Z79899 Other long term (current) drug therapy: Secondary | ICD-10-CM | POA: Insufficient documentation

## 2013-09-03 DIAGNOSIS — Z823 Family history of stroke: Secondary | ICD-10-CM

## 2013-09-03 DIAGNOSIS — E785 Hyperlipidemia, unspecified: Secondary | ICD-10-CM | POA: Diagnosis present

## 2013-09-03 DIAGNOSIS — F410 Panic disorder [episodic paroxysmal anxiety] without agoraphobia: Secondary | ICD-10-CM | POA: Diagnosis present

## 2013-09-03 DIAGNOSIS — R509 Fever, unspecified: Secondary | ICD-10-CM | POA: Insufficient documentation

## 2013-09-03 DIAGNOSIS — M129 Arthropathy, unspecified: Secondary | ICD-10-CM | POA: Diagnosis present

## 2013-09-03 DIAGNOSIS — G894 Chronic pain syndrome: Secondary | ICD-10-CM

## 2013-09-03 DIAGNOSIS — Z880 Allergy status to penicillin: Secondary | ICD-10-CM | POA: Insufficient documentation

## 2013-09-03 DIAGNOSIS — F411 Generalized anxiety disorder: Secondary | ICD-10-CM | POA: Diagnosis present

## 2013-09-03 DIAGNOSIS — F329 Major depressive disorder, single episode, unspecified: Secondary | ICD-10-CM | POA: Insufficient documentation

## 2013-09-03 DIAGNOSIS — F3289 Other specified depressive episodes: Secondary | ICD-10-CM | POA: Insufficient documentation

## 2013-09-03 DIAGNOSIS — F332 Major depressive disorder, recurrent severe without psychotic features: Secondary | ICD-10-CM

## 2013-09-03 DIAGNOSIS — K219 Gastro-esophageal reflux disease without esophagitis: Secondary | ICD-10-CM | POA: Diagnosis present

## 2013-09-03 DIAGNOSIS — Z87828 Personal history of other (healed) physical injury and trauma: Secondary | ICD-10-CM | POA: Insufficient documentation

## 2013-09-03 DIAGNOSIS — R52 Pain, unspecified: Secondary | ICD-10-CM | POA: Insufficient documentation

## 2013-09-03 DIAGNOSIS — G4700 Insomnia, unspecified: Secondary | ICD-10-CM | POA: Diagnosis present

## 2013-09-03 DIAGNOSIS — Z21 Asymptomatic human immunodeficiency virus [HIV] infection status: Secondary | ICD-10-CM | POA: Insufficient documentation

## 2013-09-03 DIAGNOSIS — F419 Anxiety disorder, unspecified: Secondary | ICD-10-CM

## 2013-09-03 DIAGNOSIS — R45851 Suicidal ideations: Secondary | ICD-10-CM

## 2013-09-03 DIAGNOSIS — Z87442 Personal history of urinary calculi: Secondary | ICD-10-CM | POA: Insufficient documentation

## 2013-09-03 DIAGNOSIS — F1110 Opioid abuse, uncomplicated: Secondary | ICD-10-CM

## 2013-09-03 LAB — COMPREHENSIVE METABOLIC PANEL
ALK PHOS: 147 U/L — AB (ref 39–117)
ALT: 61 U/L — AB (ref 0–53)
AST: 35 U/L (ref 0–37)
Albumin: 4.8 g/dL (ref 3.5–5.2)
BILIRUBIN TOTAL: 0.4 mg/dL (ref 0.3–1.2)
BUN: 10 mg/dL (ref 6–23)
CHLORIDE: 99 meq/L (ref 96–112)
CO2: 23 meq/L (ref 19–32)
Calcium: 10.8 mg/dL — ABNORMAL HIGH (ref 8.4–10.5)
Creatinine, Ser: 1.01 mg/dL (ref 0.50–1.35)
GFR calc Af Amer: >90 mL/min (ref >90)
GFR calc non Af Amer: >90 mL/min (ref >90)
Glucose, Bld: 111 mg/dL — ABNORMAL HIGH (ref 70–99)
Potassium: 3.9 mEq/L (ref 3.7–5.3)
SODIUM: 137 meq/L (ref 137–147)
Total Protein: 8.3 g/dL (ref 6.0–8.3)

## 2013-09-03 LAB — CBC
HEMATOCRIT: 45.5 % (ref 39.0–52.0)
HEMOGLOBIN: 16 g/dL (ref 13.0–17.0)
MCH: 32.3 pg (ref 26.0–34.0)
MCHC: 35.2 g/dL (ref 30.0–36.0)
MCV: 91.9 fL (ref 78.0–100.0)
Platelets: 421 10*3/uL — ABNORMAL HIGH (ref 150–400)
RBC: 4.95 MIL/uL (ref 4.22–5.81)
RDW: 14.8 % (ref 11.5–15.5)
WBC: 15.2 10*3/uL — ABNORMAL HIGH (ref 4.0–10.5)

## 2013-09-03 LAB — RAPID URINE DRUG SCREEN, HOSP PERFORMED
Amphetamines: NOT DETECTED
BARBITURATES: NOT DETECTED
Benzodiazepines: NOT DETECTED
COCAINE: NOT DETECTED
Opiates: POSITIVE — AB
TETRAHYDROCANNABINOL: NOT DETECTED

## 2013-09-03 LAB — ETHANOL: Alcohol, Ethyl (B): <11 mg/dL (ref 0–11)

## 2013-09-03 LAB — ACETAMINOPHEN LEVEL

## 2013-09-03 LAB — SALICYLATE LEVEL: Salicylate Lvl: <2 mg/dL — ABNORMAL LOW (ref 2.8–20.0)

## 2013-09-03 MED ORDER — CLONIDINE HCL 0.1 MG PO TABS
0.1000 mg | ORAL_TABLET | Freq: Every day | ORAL | Status: AC
  Administered 2013-09-08 – 2013-09-09 (×2): 0.1 mg via ORAL
  Filled 2013-09-03 (×2): qty 1

## 2013-09-03 MED ORDER — DICYCLOMINE HCL 20 MG PO TABS
20.0000 mg | ORAL_TABLET | Freq: Four times a day (QID) | ORAL | Status: AC | PRN
  Administered 2013-09-03: 20 mg via ORAL
  Filled 2013-09-03: qty 1

## 2013-09-03 MED ORDER — ONDANSETRON HCL 4 MG PO TABS
4.0000 mg | ORAL_TABLET | Freq: Three times a day (TID) | ORAL | Status: DC | PRN
Start: 2013-09-03 — End: 2013-09-03

## 2013-09-03 MED ORDER — RAMELTEON 8 MG PO TABS
8.0000 mg | ORAL_TABLET | Freq: Every day | ORAL | Status: DC
  Filled 2013-09-03: qty 1

## 2013-09-03 MED ORDER — ELVITEG-COBIC-EMTRICIT-TENOFDF 150-150-200-300 MG PO TABS
1.0000 | ORAL_TABLET | Freq: Every day | ORAL | Status: DC
  Filled 2013-09-03: qty 1

## 2013-09-03 MED ORDER — MAGNESIUM HYDROXIDE 400 MG/5ML PO SUSP: 30.0000 mL | Freq: Every day | ORAL | Status: DC | PRN

## 2013-09-03 MED ORDER — METHOCARBAMOL 500 MG PO TABS
500.0000 mg | ORAL_TABLET | Freq: Three times a day (TID) | ORAL | Status: DC | PRN
  Administered 2013-09-03 – 2013-09-06 (×8): 500 mg via ORAL
  Filled 2013-09-03 (×8): qty 1

## 2013-09-03 MED ORDER — GEMFIBROZIL 600 MG PO TABS
600.0000 mg | ORAL_TABLET | Freq: Two times a day (BID) | ORAL | Status: DC
  Filled 2013-09-03 (×2): qty 1

## 2013-09-03 MED ORDER — NAPROXEN 500 MG PO TABS
500.0000 mg | ORAL_TABLET | Freq: Two times a day (BID) | ORAL | Status: DC | PRN
  Administered 2013-09-03 – 2013-09-04 (×2): 500 mg via ORAL
  Filled 2013-09-03 (×2): qty 1

## 2013-09-03 MED ORDER — CLONIDINE HCL 0.1 MG PO TABS
0.1000 mg | ORAL_TABLET | ORAL | Status: AC
  Administered 2013-09-06 – 2013-09-07 (×4): 0.1 mg via ORAL
  Filled 2013-09-03 (×4): qty 1

## 2013-09-03 MED ORDER — ZOLPIDEM TARTRATE 5 MG PO TABS: 5.0000 mg | ORAL_TABLET | Freq: Every evening | ORAL | Status: DC | PRN

## 2013-09-03 MED ORDER — PANTOPRAZOLE SODIUM 40 MG PO TBEC: 80.0000 mg | DELAYED_RELEASE_TABLET | Freq: Every day | ORAL | Status: DC

## 2013-09-03 MED ORDER — ALUM & MAG HYDROXIDE-SIMETH 200-200-20 MG/5ML PO SUSP: 30.0000 mL | ORAL | Status: DC | PRN

## 2013-09-03 MED ORDER — ONDANSETRON 4 MG PO TBDP: 4.0000 mg | ORAL_TABLET | Freq: Four times a day (QID) | ORAL | Status: AC | PRN

## 2013-09-03 MED ORDER — FENTANYL 25 MCG/HR TD PT72: 25.0000 ug | MEDICATED_PATCH | TRANSDERMAL | Status: DC

## 2013-09-03 MED ORDER — NICOTINE 21 MG/24HR TD PT24
21.0000 mg | MEDICATED_PATCH | Freq: Every day | TRANSDERMAL | Status: DC
  Filled 2013-09-03 (×9): qty 1

## 2013-09-03 MED ORDER — LORAZEPAM 1 MG PO TABS: 1.0000 mg | ORAL_TABLET | Freq: Three times a day (TID) | ORAL | Status: DC | PRN

## 2013-09-03 MED ORDER — IBUPROFEN 200 MG PO TABS: 600.0000 mg | ORAL_TABLET | Freq: Three times a day (TID) | ORAL | Status: DC | PRN

## 2013-09-03 MED ORDER — ACETAMINOPHEN 325 MG PO TABS: 650.0000 mg | ORAL_TABLET | ORAL | Status: DC | PRN

## 2013-09-03 MED ORDER — CLONIDINE HCL 0.1 MG PO TABS
0.1000 mg | ORAL_TABLET | Freq: Four times a day (QID) | ORAL | Status: AC
  Administered 2013-09-03 – 2013-09-05 (×10): 0.1 mg via ORAL
  Filled 2013-09-03 (×12): qty 1

## 2013-09-03 MED ORDER — ACETAMINOPHEN 325 MG PO TABS
650.0000 mg | ORAL_TABLET | Freq: Four times a day (QID) | ORAL | Status: DC | PRN
  Administered 2013-09-04 – 2013-09-07 (×5): 650 mg via ORAL
  Filled 2013-09-03 (×5): qty 2

## 2013-09-03 MED ORDER — NICOTINE 21 MG/24HR TD PT24: 21.0000 mg | MEDICATED_PATCH | Freq: Every day | TRANSDERMAL | Status: DC

## 2013-09-03 MED ORDER — HYDROXYZINE HCL 25 MG PO TABS
25.0000 mg | ORAL_TABLET | Freq: Every evening | ORAL | Status: DC | PRN
  Administered 2013-09-03: 25 mg via ORAL
  Filled 2013-09-03 (×2): qty 1

## 2013-09-03 MED ORDER — HYDROXYZINE HCL 25 MG PO TABS
25.0000 mg | ORAL_TABLET | Freq: Four times a day (QID) | ORAL | Status: AC | PRN
  Administered 2013-09-03 – 2013-09-06 (×9): 25 mg via ORAL
  Filled 2013-09-03 (×8): qty 1

## 2013-09-03 MED ORDER — DULOXETINE HCL 20 MG PO CPEP: 40.0000 mg | ORAL_CAPSULE | Freq: Every day | ORAL | Status: DC

## 2013-09-03 MED ORDER — BUPROPION HCL ER (XL) 150 MG PO TB24: 150.0000 mg | ORAL_TABLET | Freq: Every day | ORAL | Status: DC

## 2013-09-03 MED ORDER — LOPERAMIDE HCL 2 MG PO CAPS
2.0000 mg | ORAL_CAPSULE | ORAL | Status: DC | PRN
  Administered 2013-09-03: 4 mg via ORAL
  Filled 2013-09-03: qty 2

## 2013-09-03 NOTE — Progress Notes (Signed)
D   Pt is calmer and having less withdrawal symptoms since being medicated   He continues to complain of sweats and said he woke up after taking a nap and the bed was soaked   A   Verbal support given  Medications administered and effectiveness monitored   Q 15 min checks   Provided pt with ice pitcher and encouraged him to let staff know if he becomes more uncomfortable R   Pt verbalized understanding and is safe at present

## 2013-09-03 NOTE — Tx Team (Signed)
Initial Interdisciplinary Treatment Plan  PATIENT STRENGTHS: (choose at least two) Ability for insight Average or above average intelligence Capable of independent living General fund of knowledge  PATIENT STRESSORS: Medication change or noncompliance   PROBLEM LIST: Problem List/Patient Goals Date to be addressed Date deferred Reason deferred Estimated date of resolution  Depression with suicidal ideation                                                       DISCHARGE CRITERIA:  Ability to meet basic life and health needs Improved stabilization in mood, thinking, and/or behavior Verbal commitment to aftercare and medication compliance Withdrawal symptoms are absent or subacute and managed without 24-hour nursing intervention  PRELIMINARY DISCHARGE PLAN: Attend aftercare/continuing care group Return to previous living arrangement  PATIENT/FAMIILY INVOLVEMENT: This treatment plan has been presented to and reviewed with the patient, Devin Crawford, and/or family member, .  The patient and family have been given the opportunity to ask questions and make suggestions.  Devin Crawford 09/03/2013, 5:17 PM

## 2013-09-03 NOTE — ED Provider Notes (Signed)
CSN: [id number]     Arrival date & time 09/03/13  1346 History  This chart was scribed for non-physician practitioner, Fayrene Helper, PA-C,working with No att. providers found, by Karle Plumber, ED Scribe.  This patient was seen in room WTR4/WLPT4 and the patient's care was started at 3:09 PM.  Chief Complaint  Patient presents with  . Medical Clearance   The history is provided by the patient. No language interpreter was used.   HPI Comments:  Devin Crawford is a 33 y.o. male with h/o HIV and chronic pain who presents to the Emergency Department wanting help with withdrawal from Fentanyl. Pt states he has become addicted to the Fentanyl since it has been prescribed to him for chronic pain for a pelvic injury. He states he has been taking more than the prescribed dose. He reports generalized body aches, subjective fever, chills, diaphoresis, BLE pain, and insomnia. He reports suicidal ideations that started last night. He states he is discouraged about withdrawing from the medication and does not think he can do it. He states his plan of suicide was to overdose on medication. He reports left leg pain right now. He denies hallucinations and homicidal ideations. Pt states he has h/o depression and has a psychiatrist.  Past Medical History  Diagnosis Date  . HIV (human immunodeficiency virus infection)   . Blood transfusion 2007  . Arthritis   . Kidney calculus   . Narcotic addiction   . Multiple pelvic fractures   . History of tonsillectomy   . hip fracture left     rod placement 04-2006  . History of spleen injury     removal 2007  . Mental disorder   . Depression    Past Surgical History  Procedure Laterality Date  . Splenectomy    . Hip fracture surgery    . Tonsillectomy     Family History  Problem Relation Age of Onset  . Multiple sclerosis Mother   . Stroke Father    History  Substance Use Topics  . Smoking status: Former Smoker -- 0.20 packs/day for 9 years    Types:  Cigarettes    Quit date: 08/03/2013  . Smokeless tobacco: Never Used  . Alcohol Use: No    Review of Systems  Constitutional: Positive for fever (sunjective) and chills.  Musculoskeletal: Positive for myalgias (generalized body aches).  Psychiatric/Behavioral: Positive for suicidal ideas. Negative for hallucinations and self-injury.    Allergies  Morphine and related; Penicillins; Sulfa antibiotics; Trazodone and nefazodone; Capsaicin; and Sustiva  Home Medications   Prior to Admission medications   Medication Sig Start Date End Date Taking? Authorizing Provider  buPROPion (WELLBUTRIN XL) 150 MG 24 hr tablet Take 1 tablet (150 mg total) by mouth daily. For depression. 08/10/13  Yes Verne Spurr, PA-C  clonazePAM (KLONOPIN) 1 MG tablet Take 1 tablet (1 mg total) by mouth 2 (two) times daily. 08/11/13  Yes Judyann Munson, MD  DULoxetine (CYMBALTA) 20 MG capsule Take 2 capsules (40 mg total) by mouth daily. For depression. 08/10/13  Yes Verne Spurr, PA-C  elvitegravir-cobicistat-emtricitabine-tenofovir (STRIBILD) 150-150-200-300 MG TABS tablet Take 1 tablet by mouth daily with breakfast. For HIV 08/10/13  Yes Verne Spurr, PA-C  fentaNYL (DURAGESIC - DOSED MCG/HR) 25 MCG/HR patch Place 1 patch (25 mcg total) onto the skin every other day. For pain. 08/10/13  Yes Verne Spurr, PA-C  gemfibrozil (LOPID) 600 MG tablet Take 1 tablet (600 mg total) by mouth 2 (two) times daily before a meal. For  cholesterol and lipids. 08/10/13  Yes Verne SpurrNeil Mashburn, PA-C  Multiple Vitamin (MULTIVITAMIN WITH MINERALS) TABS tablet Take 1 tablet by mouth daily.   Yes Historical Provider, MD  naproxen sodium (ANAPROX) 220 MG tablet Take 2 tablets (440 mg total) by mouth 2 (two) times daily with a meal. For pain. 08/10/13  Yes Verne SpurrNeil Mashburn, PA-C  omeprazole (PRILOSEC) 40 MG capsule Take 1 capsule (40 mg total) by mouth daily. For GERD. 08/10/13  Yes Verne SpurrNeil Mashburn, PA-C  ramelteon (ROZEREM) 8 MG tablet Take 1 tablet (8 mg  total) by mouth at bedtime. For insomnia. 08/10/13  Yes Verne SpurrNeil Mashburn, PA-C   Triage Vitals: BP 134/92  Pulse 97  Temp(Src) 97.6 F (36.4 C) (Oral)  Resp 18  SpO2 97% Physical Exam  Nursing note and vitals reviewed. Constitutional: He is oriented to person, place, and time. He appears well-developed and well-nourished.  HENT:  Head: Normocephalic and atraumatic.  Eyes: EOM are normal.  Neck: Normal range of motion.  Cardiovascular: Normal rate, regular rhythm and normal heart sounds.  Exam reveals no gallop and no friction rub.   No murmur heard. Pulmonary/Chest: Effort normal and breath sounds normal. No respiratory distress. He has no wheezes. He has no rales.  Musculoskeletal: Normal range of motion.  Neurological: He is alert and oriented to person, place, and time.  Skin: Skin is warm and dry.  Psychiatric: He has a normal mood and affect. His speech is normal and behavior is normal. Judgment normal. Thought content is not paranoid and not delusional. Cognition and memory are normal. He expresses suicidal ideation. He expresses no homicidal ideation. He expresses suicidal plans.  Tearful.    ED Course  Procedures (including critical care time) DIAGNOSTIC STUDIES: Oxygen Saturation is 97% on RA, normal by my interpretation.   COORDINATION OF CARE: 3:16 PM- [facility] Pt verbalizes understanding and agrees to plan.  [facility]    Medications  LORazepam (ATIVAN) tablet 1 mg (not administered)  acetaminophen (TYLENOL) tablet 650 mg (not administered)  ibuprofen (ADVIL,MOTRIN) tablet 600 mg (not administered)  zolpidem (AMBIEN) tablet 5 mg (not administered)  nicotine (NICODERM CQ - dosed in mg/24 hours) patch 21 mg (not administered)  ondansetron (ZOFRAN) tablet 4 mg (not administered)  alum & mag hydroxide-simeth (MAALOX/MYLANTA) 200-200-20 MG/5ML suspension 30 mL (not administered)  buPROPion (WELLBUTRIN XL) 24 hr tablet 150  mg (not administered)  DULoxetine (CYMBALTA) DR capsule 40 mg (not administered)  elvitegravir-cobicistat-emtricitabine-tenofovir (STRIBILD) 150-150-200-300 MG tablet 1 tablet (not administered)  fentaNYL (DURAGESIC - dosed mcg/hr) patch 25 mcg (not administered)  gemfibrozil (LOPID) tablet 600 mg (not administered)  pantoprazole (PROTONIX) EC tablet 80 mg (not administered)  ramelteon (ROZEREM) tablet 8 mg (not administered)    Labs Review Labs Reviewed  CBC - Abnormal; Notable for the following:    WBC 15.2 (*)    Platelets 421 (*)    All other components within normal limits  COMPREHENSIVE METABOLIC PANEL - Abnormal; Notable for the following:    Glucose, Bld 111 (*)    Calcium 10.8 (*)    ALT 61 (*)    Alkaline Phosphatase 147 (*)    All other components within normal limits  SALICYLATE LEVEL - Abnormal; Notable for the following:    Salicylate Lvl <2.0 (*)    All other components within normal limits  URINE RAPID DRUG SCREEN (HOSP PERFORMED) - Abnormal; Notable for the following:    Opiates POSITIVE (*)  All other components within normal limits  ETHANOL  ACETAMINOPHEN LEVEL    Imaging Review No results found.   EKG Interpretation None      MDM   Final diagnoses:  Opiate abuse, continuous  Suicidal ideation    BP 134/92  Pulse 97  Temp(Src) 97.6 F (36.4 C) (Oral)  Resp 18  SpO2 97%   I personally performed the services described in this documentation, which was scribed in my presence. The recorded information has been reviewed and is accurate.    Fayrene HelperBowie Voshon Petro, PA-C 09/03/13 [phone]

## 2013-09-03 NOTE — Progress Notes (Signed)
Pt is a 33 year old male admitted with depression and dependence on opiates   He uses a fentanyl patch   He is depressed and expressing hopelessness   He was suicidal with a plan to overdose on his medications    Pt reports loosing interest in usual activities he once enjoyed  He said he went on the internet to research depression and discovered opiates cause depression so he is requesting detox   His last use of the patch was 2 days ago   He is having some moderate withdrawal symptoms  Including sweats, diarrhea , increased anxiety , inability to sit still, yawning and some piloerection of the skin   Pt lives with his partner of 6 years and pt said the partner is very supportive  He also said his mother is supportive   He is cooperative during the assessment and became tearful a few times   assessment completed nourishment offered  Verbal support given  Medications adminsitered and effectiveness monitored   Q 15 min checks   Pt is safe and adjusting well

## 2013-09-03 NOTE — BH Assessment (Signed)
Assessment Note  Devin Crawford is an 33 y.o. male that [facility]. Pt presents with SI with plan to overdose on his medications. Pt stated he has been having ongoing SI for 3 days, but woke up an told his partner this AM that he had a plan. Pt stated that he has been feeling depressed and anxious. He reports not sleeping, decreased grooming and bathing, as well as was tearful throughout assessment.  He also reported that due to his depression, he has been abusing his prescribed pain medications again (Fentanyl). Pt reported he has not had any pain medication since yesterday and that he has been taking 2 of his prescribed patches per day rather than the one prescribed one.   Pt stated that he feels nauseous, sweaty, and shaky. Pt denies HI or psychosis. [facility], but stated he did not follow up with [facility]. Pt cannot identify any new triggers for his depression and anxiety and reports he is taking his medications as prescribed for both psychiatric and medical issues.  Pt lives with his partner and he is supportive, as is pt's mother by pt report. [facility] [place] because he told them he needed help.  Consulted with Devin Crawford, who agreed inpatient treatment warranted @ 1310 pending [facility]. Called [facility] and notified charge nurse that pt would be arriving there via [facility], as pt voluntary. Updated TTS and [facility] staff, as well as Devin Crawford, AC. Pt in agreement with disposition.   Axis I: 296.33 Major Depressive Disorder, Recurrent, Severe Without Psychotic Features, Posttraumatic Stress Disorder, Anxiety Disorder NOS (per [facility]) Axis II: Deferred Axis III:  Past Medical History  Diagnosis Date  . HIV (human immunodeficiency virus infection)   . Blood transfusion 2007  . Arthritis   . Kidney calculus   .  Narcotic addiction   . Multiple pelvic fractures   . History of tonsillectomy   . hip fracture left     rod placement 04-2006  . History of spleen injury     removal 2007  . Mental disorder   . Depression    Axis IV: occupational problems and other psychosocial or environmental problems Axis V: 21-30 behavior considerably influenced by delusions or hallucinations OR serious impairment in judgment, communication OR inability to function in almost all areas  Past Medical History:  Past Medical History  Diagnosis Date  . HIV (human immunodeficiency virus infection)   . Blood transfusion 2007  . Arthritis   . Kidney calculus   . Narcotic addiction   . Multiple pelvic fractures   . History of tonsillectomy   . hip fracture left     rod placement 04-2006  . History of spleen injury     removal 2007  . Mental disorder   . Depression     Past Surgical History  Procedure Laterality Date  . Splenectomy    . Hip fracture surgery    . Tonsillectomy      Family History:  Family History  Problem Relation Age of Onset  . Multiple sclerosis Mother   . Stroke Father     Social History:  reports that he quit smoking about 4 weeks ago. His smoking use included Cigarettes. He has a 1.8 pack-year smoking history. He has never used smokeless  tobacco. He reports that he does not drink alcohol or use illicit drugs.  Additional Social History:  Alcohol / Drug Use Pain Medications: see med list Prescriptions: see med list Over the Counter: see med list History of alcohol / drug use?: Yes Substance #1 Name of Substance 1: Fentanyl 1 - Age of First Use: unknown 1 - Amount (size/oz): 2 patches 1 - Frequency: daily 1 - Duration: unknown 1 - Last Use / Amount: yesterday-2 patches  CIWA:   COWS:    Allergies:  Allergies  Allergen Reactions  . Morphine And Related     Hallucinations   . Penicillins     edema  . Sulfa Antibiotics Hives  . Trazodone And Nefazodone     Night  terrors  . Capsaicin Rash  . Sustiva [Efavirenz] Rash    Whole body rash     Home Medications:  (Not in a hospital admission)  OB/GYN Status:  No LMP for male patient.  General Assessment Data Location of Assessment: [facility] Assessment Services Is this a Tele or Face-to-Face Assessment?: Face-to-Face Is this an Initial Assessment or a Re-assessment for this encounter?: Initial Assessment Living Arrangements: Spouse/significant other Can pt return to current living arrangement?: Yes Admission Status: Voluntary Is patient capable of signing voluntary admission?: Yes Transfer from: Home Referral Source: Self/Family/Friend  Medical Screening Exam [facility] Walk-in ONLY) Medical Exam completed: No Reason for MSE not completed: Other: ([facility])  [facility] Living Arrangements: Spouse/significant other Name of Psychiatrist: [facility] Name of Therapist: none  Education Status Is patient currently in school?: No  Risk to self Suicidal Ideation: Yes-Currently Present Suicidal Intent: Yes-Currently Present Is patient at risk for suicide?: Yes Suicidal Plan?: Yes-Currently Present Specify Current Suicidal Plan: to overdose on his medication Access to Means: Yes Specify Access to Suicidal Means: Has access to his medications What has been your use of drugs/alcohol within the last 12 months?: pt reports he has been using too much of his prescribed Fentanyl Previous Attempts/Gestures: Yes How many times?: 1 Other Self Harm Risks: pt denies Triggers for Past Attempts: Unknown Intentional Self Injurious Behavior: None Family Suicide History: No Recent stressful life event(s): Recent negative physical changes;Turmoil (Comment);Job Loss;Other (Comment) (SI with plan, no job, abusing pain medications) Persecutory voices/beliefs?: No Depression: Yes Depression Symptoms: Despondent;Insomnia;Tearfulness;Isolating;Fatigue;Guilt;Loss of interest in usual pleasures;Feeling  worthless/self pity Substance abuse history and/or treatment for substance abuse?: Yes Suicide prevention information given to non-admitted patients: Not applicable  Risk to Others Homicidal Ideation: No Thoughts of Harm to Others: No Current Homicidal Intent: No Current Homicidal Plan: No Access to Homicidal Means: No Identified Victim: pt denies History of harm to others?: No Assessment of Violence: None Noted Violent Behavior Description: na - pt calm, cooperative Does patient have access to weapons?: No Criminal Charges Pending?: No Does patient have a court date: No  Psychosis Hallucinations: None noted Delusions: None noted  Mental Status Report Appear/Hygiene: Other (Comment) (WNL) Eye Contact: Good Motor Activity: Freedom of movement;Unremarkable Speech: Logical/coherent Level of Consciousness: Alert;Crying Mood: Depressed;Anxious Affect: Anxious;Depressed;Appropriate to circumstance Anxiety Level: Moderate Thought Processes: Coherent;Relevant Judgement: Unimpaired Orientation: Person;Place;Time;Situation Obsessive Compulsive Thoughts/Behaviors: None  Cognitive Functioning Concentration: Decreased Memory: Recent Intact;Remote Intact IQ: Average Insight: Poor Impulse Control: Poor Appetite: Poor Weight Loss: 0 Weight Gain: 0 Sleep: Decreased Total Hours of Sleep:  (varies - waking through noght or not sleeping) Vegetative Symptoms: Staying in bed;Decreased grooming;Not bathing  ADLScreening [facility] Assessment Services) Patient's cognitive ability adequate to safely complete daily  activities?: Yes Patient able to express need for assistance with ADLs?: Yes Independently performs ADLs?: Yes (appropriate for developmental age)  Prior Inpatient Therapy Prior Inpatient Therapy: Yes Prior Therapy Dates: 2007, 2015 Prior Therapy Facilty/Provider(s): [place], [facility] Reason for Treatment: Depression, SI  Prior Outpatient Therapy Prior Outpatient Therapy:  Yes Prior Therapy Dates: Current Prior Therapy Facilty/Provider(s): [facility] Reason for Treatment: med mgnt  ADL Screening (condition at time of admission) Patient's cognitive ability adequate to safely complete daily activities?: Yes Is the patient deaf or have difficulty hearing?: No Does the patient have difficulty seeing, even when wearing glasses/contacts?: No Does the patient have difficulty concentrating, remembering, or making decisions?: No Patient able to express need for assistance with ADLs?: Yes Does the patient have difficulty dressing or bathing?: No Independently performs ADLs?: Yes (appropriate for developmental age) Does the patient have difficulty walking or climbing stairs?: No Weakness of Legs: None Weakness of Arms/Hands: None  Home Assistive Devices/Equipment Home Assistive Devices/Equipment: None    Abuse/Neglect Assessment (Assessment to be complete while patient is alone) Physical Abuse: Denies Verbal Abuse: Denies Sexual Abuse: Denies Exploitation of patient/patient's resources: Denies Self-Neglect: Denies Values / Beliefs Cultural Requests During Hospitalization: None Spiritual Requests During Hospitalization: None Consults Spiritual Care Consult Needed: No Social Work Consult Needed: No Merchant navy officer (For Healthcare) Advance Directive: Patient does not have advance directive;Patient would not like information    Additional Information 1:1 In Past 12 Months?: No CIRT Risk: No Elopement Risk: No Does patient have medical clearance?: No     Disposition:  Disposition Initial Assessment Completed for this Encounter: Yes Disposition of Patient: Inpatient treatment program Type of inpatient treatment program: Adolescent (Pt accepted [facility] pending medical clearance)  On Site Evaluation by:   Reviewed with Physician:    Casimer Lanius, MS, [facility] Licensed Professional Counselor Triage Specialist  09/03/2013 1:26 PM

## 2013-09-03 NOTE — ED Notes (Signed)
[facility] arrived for transport 

## 2013-09-03 NOTE — ED Notes (Signed)
Pt states he is withdrawing from fentanyl.  Has been without it for a day.  Gen body aches, sweats, leg pain, insomnia.  States he is having thoughts of hurting himself by overdosing on pills. Denies HI.

## 2013-09-03 NOTE — Progress Notes (Signed)
The focus of this group is to help patients review their daily goal of treatment and discuss progress on daily workbooks.  Pt did not attend the evening group session. 

## 2013-09-03 NOTE — Discharge Instructions (Signed)
Drug Abuse and Addiction in Sports  There are many types of drugs that one may become addicted to including illegal drugs (marijuana, cocaine, amphetamines, hallucinogens, and narcotics), prescription drugs (hydrocodone, codeine, and alprazolam), and other chemicals such as alcohol or nicotine.  Two types of addiction exist: physical and emotional. Physical addiction usually occurs after prolonged use of a drug. However, some drugs may only take a couple uses before addiction can occur. Physical addiction is marked by withdrawal symptoms, in which the person experiences negative symptoms such as sweat, anxiety, tremors, hallucinations, or cravings in the absence of using the drug. Emotional dependence is the psychological desire for the "high" that the drugs produce when taken.  SYMPTOMS   · Inattentiveness.  · Negligence.  · Forgetfulness.  · Insomnia.  · Mood swings.  RISK INCREASES WITH:   · Family history of addiction.  · Personal history of addictive personality.  Studies have shown that risktakers, which many athletes are, have a higher risk of addiction.  PREVENTION  The only adequate prevention of drug abuse is abstinence from drugs.  TREATMENT   The first step in quitting substance abuse is recognizing the problem and realizing that one has the power to change. Quitting requires a plan and support from others. It is often necessary to seek medical assistance. Caregivers are available to offer counseling, and for certain cases, medicine to diminish the physical symptoms of withdrawal. Many organizations exist such as Alcoholics Anonymous, Narcotics Anonymous, or the National Council on Alcoholism that offer support for individuals who have chosen to quit their habits.  Document Released: 05/05/2005 Document Revised: 07/28/2011 Document Reviewed: 08/17/2008  Exit[employer]® Patient Information ©2014 [employer].

## 2013-09-03 NOTE — ED Provider Notes (Signed)
Medical screening examination/treatment/procedure(s) were performed by non-physician practitioner and as supervising physician I was immediately available for consultation/collaboration.   EKG Interpretation None       Shelda JakesScott W. Tully Mcinturff, MD 09/03/13 [phone]

## 2013-09-03 NOTE — [facility] Group Notes (Signed)
[facility] LCSW Group Therapy  09/03/2013 5:28 PM  Type of Therapy:  Group Therapy  Participation Level:  None  Patient did not attend group.   Haskel KhanGregory C Pickett Jr. 09/03/2013, 5:28 PM

## 2013-09-04 DIAGNOSIS — R45851 Suicidal ideations: Secondary | ICD-10-CM

## 2013-09-04 MED ORDER — GEMFIBROZIL 600 MG PO TABS
600.0000 mg | ORAL_TABLET | Freq: Two times a day (BID) | ORAL | Status: DC
  Administered 2013-09-04 – 2013-09-09 (×10): 600 mg via ORAL
  Filled 2013-09-04 (×9): qty 1
  Filled 2013-09-04: qty 28
  Filled 2013-09-04 (×4): qty 1
  Filled 2013-09-04: qty 28

## 2013-09-04 MED ORDER — GABAPENTIN 600 MG PO TABS
300.0000 mg | ORAL_TABLET | Freq: Three times a day (TID) | ORAL | Status: DC
  Filled 2013-09-04 (×6): qty 0.5

## 2013-09-04 MED ORDER — DULOXETINE HCL 60 MG PO CPEP
60.0000 mg | ORAL_CAPSULE | Freq: Every day | ORAL | Status: DC
  Administered 2013-09-04 – 2013-09-09 (×6): 60 mg via ORAL
  Filled 2013-09-04 (×8): qty 1
  Filled 2013-09-04: qty 14

## 2013-09-04 MED ORDER — ADULT MULTIVITAMIN W/MINERALS CH
1.0000 | ORAL_TABLET | Freq: Every day | ORAL | Status: DC
  Administered 2013-09-04 – 2013-09-09 (×6): 1 via ORAL
  Filled 2013-09-04 (×8): qty 1

## 2013-09-04 MED ORDER — RAMELTEON 8 MG PO TABS
8.0000 mg | ORAL_TABLET | Freq: Every day | ORAL | Status: DC
  Administered 2013-09-04 – 2013-09-08 (×5): 8 mg via ORAL
  Filled 2013-09-04 (×7): qty 1
  Filled 2013-09-04: qty 14

## 2013-09-04 MED ORDER — ELVITEG-COBIC-EMTRICIT-TENOFDF 150-150-200-300 MG PO TABS
1.0000 | ORAL_TABLET | Freq: Every day | ORAL | Status: DC
  Administered 2013-09-05 – 2013-09-09 (×5): 1 via ORAL
  Filled 2013-09-04 (×7): qty 1
  Filled 2013-09-04: qty 14

## 2013-09-04 MED ORDER — GABAPENTIN 300 MG PO CAPS
300.0000 mg | ORAL_CAPSULE | Freq: Three times a day (TID) | ORAL | Status: DC
  Administered 2013-09-04 – 2013-09-06 (×7): 300 mg via ORAL
  Filled 2013-09-04 (×12): qty 1

## 2013-09-04 MED ORDER — HYDROXYZINE HCL 50 MG PO TABS
50.0000 mg | ORAL_TABLET | Freq: Every evening | ORAL | Status: DC | PRN
  Administered 2013-09-04 – 2013-09-05 (×2): 50 mg via ORAL
  Filled 2013-09-04: qty 1
  Filled 2013-09-04: qty 14
  Filled 2013-09-04: qty 1

## 2013-09-04 MED ORDER — DULOXETINE HCL 20 MG PO CPEP
40.0000 mg | ORAL_CAPSULE | Freq: Every day | ORAL | Status: DC
  Filled 2013-09-04 (×2): qty 2

## 2013-09-04 MED ORDER — PANTOPRAZOLE SODIUM 40 MG PO TBEC
40.0000 mg | DELAYED_RELEASE_TABLET | Freq: Every day | ORAL | Status: DC
  Administered 2013-09-04 – 2013-09-09 (×6): 40 mg via ORAL
  Filled 2013-09-04 (×3): qty 1
  Filled 2013-09-04: qty 14
  Filled 2013-09-04 (×3): qty 1

## 2013-09-04 MED ORDER — NAPROXEN 500 MG PO TABS
500.0000 mg | ORAL_TABLET | Freq: Three times a day (TID) | ORAL | Status: DC
  Administered 2013-09-04 – 2013-09-06 (×7): 500 mg via ORAL
  Filled 2013-09-04 (×13): qty 1

## 2013-09-04 MED ORDER — BUPROPION HCL ER (XL) 150 MG PO TB24
150.0000 mg | ORAL_TABLET | Freq: Every day | ORAL | Status: DC
  Administered 2013-09-04 – 2013-09-09 (×6): 150 mg via ORAL
  Filled 2013-09-04: qty 1
  Filled 2013-09-04: qty 14
  Filled 2013-09-04 (×6): qty 1

## 2013-09-04 NOTE — Progress Notes (Signed)
D) Pt is anxious and is moved to tears easily. States he feels weak due to the medications, but continues to feel the physical withdrawal from the medications. Pt. Comes to the groups but will sit and cry through them. Feels that he and his life are coming apart. Rates his depression and hopelessness both at a 10 and admits to thoughts of SI and HI. Affect is sad. Mood depressed. A) Given support, reassurance and praise. Encouraged to come to the staff if he feels the need. Encouraged to attend the groups and allowed to leave when he feels he cannot control himself and is crying. R) Rates his depression and hopelessness both at a 10 and has thoughts of SI on and off. Contracts for his safety.

## 2013-09-04 NOTE — Progress Notes (Signed)
Psychoeducational Group Note  Date: 09/04/2013 Time:  0930   Group Topic/Focus:  Gratefulness:  The focus of this group is to help patients identify what two things they are most grateful for in their lives. What helps ground them and to center them on their work to their recovery.  Participation Level:  Active  Participation Quality:  Appropriate  Affect:  Appropriate  Cognitive:  Oriented  Insight:  Improving  Engagement in Group:  Engaged  Additional Comments:  Pt. participated in the group.  Tashika Goodin A   

## 2013-09-04 NOTE — H&P (Signed)
Above note was reviewed. Concur with above assessment and plan.  Aniaya Bacha P Ronaldo Crilly, MD 

## 2013-09-04 NOTE — Progress Notes (Signed)
Psychoeducational Group Note  Date:  09/04/2013 Time:  1015  Group Topic/Focus:  Making Healthy Choices:   The focus of this group is to help patients identify negative/unhealthy choices they were using prior to admission and identify positive/healthier coping strategies to replace them upon discharge.  Participation Level:  Active  Participation Quality:  Appropriate  Affect:  Appropriate  Cognitive:  Oriented  Insight:  Improving  Engagement in Group:  Engaged  Additional Comments:  Attended the group and partisapated  Raja Caputi A 09/04/2013  

## 2013-09-04 NOTE — Progress Notes (Signed)
D: pt stated he has passive si but contracts for safety. Pt stated he is not sleeping and has not been able to sleep well for awhile. NP made aware. Pt rates pain 7/10 on leg. denies hi/avh. Pt is calm and cooperative. Appropriate on unit A: rozerem offered for sleep. q 15 min safety checks. Support and encouragement offered R: pt remains safe on unit.no further signs of distress or complaints at this time

## 2013-09-04 NOTE — H&P (Signed)
Psychiatric Admission Assessment Adult  Patient Identification:  Devin Crawford Date of Evaluation:  09/04/2013 Chief Complaint:  MDD PTSD Anxiety D/O History of Present Illness: Devin Crawford is a 33 year old SWM, presented to the ED reporting withdrawal from opiates as he had been abusing his Fentanyl patches and had run out. He reported suicidal ideations that had started with in the previous 24 hours. He was accepted to [facility] for further evaluation and treatment of his opiate dependency and suicidal ideations. This is his third admission this year. Elements:  Location:  adult unit. Quality:  chronic. Severity:  moderate. Timing:  patient notes he has been out of his patches for 3 days now. Duration:  states he has been abusing his narcotics for a year and a half. Context:  patient has no insurance, no job, and his partner supports everything. Associated Signs/Synptoms: Depression Symptoms:  depressed mood, anhedonia, psychomotor agitation, psychomotor retardation, fatigue, difficulty concentrating, hopelessness, recurrent thoughts of death, suicidal thoughts with specific plan, anxiety, panic attacks, (Hypo) Manic Symptoms:  Irritable Mood, Anxiety Symptoms:  Excessive Worry, Panic Symptoms, Psychotic Symptoms:  denies PTSD Symptoms: denies Total Time spent with patient: 30 minutes  Psychiatric Specialty Exam: Physical Exam  Constitutional: He appears well-developed and well-nourished.  Psychiatric: His speech is normal and behavior is normal. His mood appears anxious. Thought content is not paranoid and not delusional. Cognition and memory are impaired. He expresses impulsivity. He does not express inappropriate judgment. He exhibits a depressed mood. He expresses suicidal ideation. He expresses no homicidal ideation. He expresses suicidal plans. He expresses no homicidal plans. He exhibits normal recent memory and normal remote memory.  Patient is seen and chart is reviewed. I  agree with the findings of the exam completed in the ED with no exceptions at this time.    Review of Systems  Constitutional: Positive for chills, weight loss, malaise/fatigue and diaphoresis.  HENT: Negative.   Eyes: Negative.   Respiratory: Negative.   Cardiovascular: Positive for palpitations. Negative for chest pain, orthopnea, claudication, leg swelling and PND.  Gastrointestinal: Positive for heartburn, nausea, vomiting, abdominal pain and diarrhea. Negative for constipation, blood in stool and melena.  Genitourinary: Positive for frequency. Negative for dysuria, urgency, hematuria and flank pain.  Musculoskeletal: Positive for back pain, joint pain and myalgias. Negative for falls and neck pain.  Skin: Negative for itching and rash.  Neurological: Positive for weakness. Negative for dizziness, tingling, tremors, sensory change, speech change, focal weakness, seizures and loss of consciousness.  Endo/Heme/Allergies: Negative.   Psychiatric/Behavioral: Positive for depression, suicidal ideas and substance abuse. Negative for hallucinations and memory loss. The patient is nervous/anxious and has insomnia.     Blood pressure 111/72, pulse 106, temperature 97.7 F (36.5 C), temperature source Oral, resp. rate 18, height $RemoveBe'5\' 8"'[id number]$  (1.727 m), weight 88.905 kg (196 lb).Body mass index is 29.81 kg/(m^2).  General Appearance: Disheveled  Eye Contact::  Good  Speech:  Clear and Coherent  Volume:  Normal  Mood:  Depressed  Affect:  Tearful  Thought Process:  Goal Directed  Orientation:  Full (Time, Place, and Person)  Thought Content:  WDL  Suicidal Thoughts:  Yes, with plan, but can contract for safety.  Homicidal Thoughts:  No  Memory:  NA  Judgement:  Impaired  Insight:  Shallow  Psychomotor Activity:  Decreased  Concentration:  Poor  Recall:  Goodman of Knowledge:Fair  Language: Good  Akathisia:  No  Handed:  Right  AIMS (if indicated):  Assets:  Communication Skills Desire  for Improvement Housing  Sleep:  Number of Hours: 5    Musculoskeletal: Strength & Muscle Tone: within normal limits Gait & Station: normal Patient leans: N/A  Past Psychiatric History: Diagnosis:  Substance induced mood disorder  Hospitalizations:  [facility] x 3  Outpatient Care:   Did not follow up  Substance Abuse Care:  Did not follow up  Self-Mutilation:  denies  Suicidal Attempts:   denies  Violent Behaviors:   denies   Past Medical History:   Past Medical History  Diagnosis Date  . HIV (human immunodeficiency virus infection)   . Blood transfusion 2007  . Arthritis   . Kidney calculus   . Narcotic addiction   . Multiple pelvic fractures   . History of tonsillectomy   . hip fracture left     rod placement 04-2006  . History of spleen injury     removal 2007  . Mental disorder   . Depression     Allergies:   Allergies  Allergen Reactions  . Morphine And Related     Hallucinations   . Penicillins     edema  . Sulfa Antibiotics Hives  . Trazodone And Nefazodone     Night terrors  . Capsaicin Rash  . Sustiva [Efavirenz] Rash    Whole body rash    PTA Medications: Prescriptions prior to admission  Medication Sig Dispense Refill  . buPROPion (WELLBUTRIN XL) 150 MG 24 hr tablet Take 1 tablet (150 mg total) by mouth daily. For depression.  30 tablet  0  . clonazePAM (KLONOPIN) 1 MG tablet Take 1 tablet (1 mg total) by mouth 2 (two) times daily.  60 tablet  0  . DULoxetine (CYMBALTA) 20 MG capsule Take 2 capsules (40 mg total) by mouth daily. For depression.  60 capsule  0  . elvitegravir-cobicistat-emtricitabine-tenofovir (STRIBILD) 150-150-200-300 MG TABS tablet Take 1 tablet by mouth daily with breakfast. For HIV  30 tablet  11  . fentaNYL (DURAGESIC - DOSED MCG/HR) 25 MCG/HR patch Place 1 patch (25 mcg total) onto the skin every other day. For pain.    0  . gemfibrozil (LOPID) 600 MG tablet Take 1 tablet (600 mg total) by mouth 2 (two) times daily before a meal.  For cholesterol and lipids.  60 tablet  11  . Multiple Vitamin (MULTIVITAMIN WITH MINERALS) TABS tablet Take 1 tablet by mouth daily.      . naproxen sodium (ANAPROX) 220 MG tablet Take 2 tablets (440 mg total) by mouth 2 (two) times daily with a meal. For pain.      [name] omeprazole (PRILOSEC) 40 MG capsule Take 1 capsule (40 mg total) by mouth daily. For GERD.      . ramelteon (ROZEREM) 8 MG tablet Take 1 tablet (8 mg total) by mouth at bedtime. For insomnia.  30 tablet  0    Previous Psychotropic Medications:  Medication/Dose   Effexor   Zoloft              Substance Abuse History in the last 12 months:  yes  Consequences of Substance Abuse: Medical Consequences:  withdrawal  Social History:  reports that he quit smoking about 4 weeks ago. His smoking use included Cigarettes. He has a 1.8 pack-year smoking history. He has never used smokeless tobacco. He reports that he does not drink alcohol or use illicit drugs. Additional Social History: Pain Medications: fentntynl patch Prescriptions: see med list Over the Counter: see med list History  of alcohol / drug use?: No history of alcohol / drug abuse Withdrawal Symptoms: Fever / Chills;Diarrhea;Seizures;Agitation Name of Substance 1: Fentanyl 1 - Age of First Use: unknown 1 - Amount (size/oz): 2 patches 1 - Frequency: daily 1 - Duration: unknown 1 - Last Use / Amount: yesterday-2 patches                  Current Place of Residence:   Place of Birth:   Family Members: Marital Status:  Single Children:  Sons:  Daughters: Relationships: Education:  Dentist Problems/Performance: Religious Beliefs/Practices: History of Abuse (Emotional/Phsycial/Sexual) Occupational Experiences; Military History:  None. Legal History: Hobbies/Interests:  Family History:   Family History  Problem Relation Age of Onset  . Multiple sclerosis Mother   . Stroke Father     Results for orders placed during the hospital  encounter of 09/03/13 (from the past 72 hour(s))  CBC     Status: Abnormal   Collection Time    09/03/13  2:02 PM      Result Value Ref Range   WBC 15.2 (*) 4.0 - 10.5 K/uL   RBC 4.95  4.22 - 5.81 MIL/uL   Hemoglobin 16.0  13.0 - 17.0 g/dL   HCT 45.5  39.0 - 52.0 %   MCV 91.9  78.0 - 100.0 fL   MCH 32.3  26.0 - 34.0 pg   MCHC 35.2  30.0 - 36.0 g/dL   RDW 14.8  11.5 - 15.5 %   Platelets 421 (*) 150 - 400 K/uL  COMPREHENSIVE METABOLIC PANEL     Status: Abnormal   Collection Time    09/03/13  2:02 PM      Result Value Ref Range   Sodium 137  137 - 147 mEq/L   Potassium 3.9  3.7 - 5.3 mEq/L   Chloride 99  96 - 112 mEq/L   CO2 23  19 - 32 mEq/L   Glucose, Bld 111 (*) 70 - 99 mg/dL   BUN 10  6 - 23 mg/dL   Creatinine, Ser 1.01  0.50 - 1.35 mg/dL   Calcium 10.8 (*) 8.4 - 10.5 mg/dL   Total Protein 8.3  6.0 - 8.3 g/dL   Albumin 4.8  3.5 - 5.2 g/dL   AST 35  0 - 37 U/L   ALT 61 (*) 0 - 53 U/L   Alkaline Phosphatase 147 (*) 39 - 117 U/L   Total Bilirubin 0.4  0.3 - 1.2 mg/dL   GFR calc non Af Amer >90  >90 mL/min   GFR calc Af Amer >90  >90 mL/min   Comment: (NOTE)     The eGFR has been calculated using the CKD EPI equation.     This calculation has not been validated in all clinical situations.     eGFR's persistently <90 mL/min signify possible Chronic Kidney     Disease.  ETHANOL     Status: None   Collection Time    09/03/13  2:02 PM      Result Value Ref Range   Alcohol, Ethyl (B) <11  0 - 11 mg/dL   Comment:            LOWEST DETECTABLE LIMIT FOR     SERUM ALCOHOL IS 11 mg/dL     FOR MEDICAL PURPOSES ONLY  SALICYLATE LEVEL     Status: Abnormal   Collection Time    09/03/13  2:02 PM      Result Value Ref Range   Salicylate  Lvl <2.0 (*) 2.8 - 20.0 mg/dL  ACETAMINOPHEN LEVEL     Status: None   Collection Time    09/03/13  2:02 PM      Result Value Ref Range   Acetaminophen (Tylenol), Serum <15.0  10 - 30 ug/mL   Comment:            THERAPEUTIC CONCENTRATIONS VARY      SIGNIFICANTLY. A RANGE OF 10-30     ug/mL MAY BE AN EFFECTIVE     CONCENTRATION FOR MANY PATIENTS.     HOWEVER, SOME ARE BEST TREATED     AT CONCENTRATIONS OUTSIDE THIS     RANGE.     ACETAMINOPHEN CONCENTRATIONS     >150 ug/mL AT 4 HOURS AFTER     INGESTION AND >50 ug/mL AT 12     HOURS AFTER INGESTION ARE     OFTEN ASSOCIATED WITH TOXIC     REACTIONS.  URINE RAPID DRUG SCREEN (HOSP PERFORMED)     Status: Abnormal   Collection Time    09/03/13  2:21 PM      Result Value Ref Range   Opiates POSITIVE (*) NONE DETECTED   Cocaine NONE DETECTED  NONE DETECTED   Benzodiazepines NONE DETECTED  NONE DETECTED   Amphetamines NONE DETECTED  NONE DETECTED   Tetrahydrocannabinol NONE DETECTED  NONE DETECTED   Barbiturates NONE DETECTED  NONE DETECTED   Comment:            DRUG SCREEN FOR MEDICAL PURPOSES     ONLY.  IF CONFIRMATION IS NEEDED     FOR ANY PURPOSE, NOTIFY LAB     WITHIN 5 DAYS.                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 Admit for crisis management and stabilization. 2. Medication management to reduce current symptoms to base line and improve the patient's overall level of functioning. 3. Treat health problems as indicated. 4. Develop treatment plan to decrease risk of relapse upon discharge and to reduce the need for readmission. 5. Psycho-social education regarding relapse prevention  and self care. 6. Health care follow up as needed for medical problems. 7. Restart home medications where appropriate. 8. Clonidine protocal for Opiate withdrawal. 9.  Celebrex $RemoveB'400mg'kRUxYurt$  po qd for joint pain if ok'd by pharmD. 10. Supportive care for withdrawal symptoms. Treatment Plan Summary: Daily contact with patient to assess and evaluate symptoms and progress in treatment Medication management Current Medications:  Current Facility-Administered Medications  Medication Dose Route Frequency Provider Last Rate Last Dose  .  acetaminophen (TYLENOL) tablet 650 mg  650 mg Oral Q6H PRN Encarnacion Slates, NP   650 mg at 09/04/13 0015  . alum & mag hydroxide-simeth (MAALOX/MYLANTA) 200-200-20 MG/5ML suspension 30 mL  30 mL Oral Q4H PRN Encarnacion Slates, NP      . cloNIDine (CATAPRES) tablet 0.1 mg  0.1 mg Oral QID Encarnacion Slates, NP   0.1 mg at 09/04/13 7573   Followed by  . [START ON 09/06/2013] cloNIDine (CATAPRES) tablet 0.1 mg  0.1 mg Oral BH-qamhs Encarnacion Slates, NP       Followed by  . [START ON 09/08/2013] cloNIDine (CATAPRES) tablet 0.1 mg  0.1 mg Oral QAC breakfast Encarnacion Slates, NP      . dicyclomine (BENTYL) tablet 20 mg  20 mg Oral Q6H PRN Encarnacion Slates, NP   20 mg at 09/03/13 1732  . hydrOXYzine (ATARAX/VISTARIL) tablet 25 mg  25 mg Oral QHS PRN Encarnacion Slates, NP   25 mg at 09/03/13 2135  . hydrOXYzine (ATARAX/VISTARIL) tablet 25 mg  25 mg Oral Q6H PRN Encarnacion Slates, NP   25 mg at 09/04/13 0814  . loperamide (IMODIUM) capsule 2-4 mg  2-4 mg Oral PRN Encarnacion Slates, NP   4 mg at 09/03/13 1732  . magnesium hydroxide (MILK OF MAGNESIA) suspension 30 mL  30 mL Oral Daily PRN Encarnacion Slates, NP      . methocarbamol (ROBAXIN) tablet 500 mg  500 mg Oral Q8H PRN Encarnacion Slates, NP   500 mg at 09/04/13 0134  . naproxen (NAPROSYN) tablet 500 mg  500 mg Oral BID PRN Encarnacion Slates, NP   500 mg at 09/04/13 0134  . nicotine (NICODERM CQ - dosed in mg/24 hours) patch 21 mg  21 mg Transdermal Q0600 Encarnacion Slates,  NP      . ondansetron (ZOFRAN-ODT) disintegrating tablet 4 mg  4 mg Oral Q6H PRN Encarnacion Slates, NP        Observation Level/Precautions:  Routine  Laboratory:  reviewed  Psychotherapy:  Individual and group  Medications:  Clonidine protocol, home medication,   Consultations:  As needed  Discharge Concerns:  Will need rehab at residential facility.  Estimated LOS:   5-7 days  Other:     I certify that inpatient services furnished can reasonably be expected to improve the patient's condition.   Marlane Hatcher. Kiyona Mcnall RPAC 10:17 AM 09/04/2013

## 2013-09-04 NOTE — [facility] Group Notes (Signed)
[facility] LCSW Group Therapy Note  09/04/2013 / 1:15 PM  Type of Therapy and Topic:  Group Therapy: Avoiding Self-Sabotaging and Enabling Behaviors  Participation Level:  Active while present   Mood: Depressed  Description of Group:     Learn how to identify obstacles, self-sabotaging and enabling behaviors, what are they, why do we do them and what needs do these behaviors meet? Discuss unhealthy relationships and how to have positive healthy boundaries with those that sabotage and enable. Explore aspects of self-sabotage and enabling in yourself and how to limit these self-destructive behaviors in everyday life.  Therapeutic Goals: 1. Patient will identify one obstacle that relates to self-sabotage and enabling behaviors 2. Patient will identify one personal self-sabotaging or enabling behavior they did prior to admission 3. Patient able to establish a plan to change the above identified behavior they did prior to admission:  4. Patient will demonstrate ability to communicate their needs through discussion and/or role plays.   Summary of Patient Progress: The main focus of today's process group was to discuss what "self-sabotage" means and use Motivational Interviewing to discuss what benefits, negative or positive, were involved in a self-identified self-sabotaging behavior. We then talked about reasons the patient may want to change the behavior and her current desire to change. Patient missed warm up due to coming in late to group and when asked to share something he is looking forward to this year patient had difficulty. Patient preferred to focus upon what had gone wrong for him in addition to how badly it had gone wrong.  Patient shared that he is willing to make one significant change to promote self care upon discharge and that change is to go outside his apartment for at least 15 minutes daily.   Therapeutic Modalities:   Cognitive Behavioral Therapy Person-Centered  Therapy Motivational Interviewing   Devin Bernatherine C [name], LCSW

## 2013-09-04 NOTE — [facility] Suicide Risk Assessment (Signed)
   Nursing information obtained from:    Demographic factors:    Current Mental Status:    Loss Factors:    Historical Factors:    Risk Reduction Factors:    Total Time spent with patient: 1 hour  CLINICAL FACTORS:   Alcohol/Substance Abuse/Dependencies  Pt reports being admitted to detox from pain meds, and reports current w/d sxs. Poor sleep, poor appetite. Pt reports suicidal ideation, with a plan to overdose on pills. No HI/AVH.  Psychiatric Specialty Exam: Physical Exam  ROS  Blood pressure 129/86, pulse 109, temperature 97.7 F (36.5 C), temperature source Oral, resp. rate 18, height 5\' 8"  (1.727 m), weight 88.905 kg (196 lb).Body mass index is 29.81 kg/(m^2).  General Appearance: Casual  Eye Contact::  Poor  Speech:  Negative  Volume:  Decreased  Mood:  Depressed  Affect:  Depressed  Thought Process:  Goal Directed  Orientation:  Full (Time, Place, and Person)  Thought Content:  Negative  Suicidal Thoughts:  Yes.  with intent/plan  Homicidal Thoughts:  No  Memory:  Negative  Judgement:  Poor  Insight:  Shallow  Psychomotor Activity:  Decreased  Concentration:  Poor  Recall:  Fair  Fund of Knowledge:Fair  Language: Fair  Akathisia:  Negative  Handed:  Right  AIMS (if indicated):     Assets:  Communication Skills Desire for Improvement  Sleep:  Number of Hours: 5   Musculoskeletal: Strength & Muscle Tone: within normal limits Gait & Station: normal Patient leans: N/A  COGNITIVE FEATURES THAT CONTRIBUTE TO RISK:  Closed-mindedness Thought constriction (tunnel vision)    SUICIDE RISK:   Moderate:  Frequent suicidal ideation with limited intensity, and duration, some specificity in terms of plans, no associated intent, good self-control, limited dysphoria/symptomatology, some risk factors present, and identifiable protective factors, including available and accessible social support.  PLAN OF CARE:  I certify that inpatient services furnished can  reasonably be expected to improve the patient's condition.  Caprice KluverVinay P Jazzmon Prindle 09/04/2013, 12:36 PM

## 2013-09-05 DIAGNOSIS — F1120 Opioid dependence, uncomplicated: Principal | ICD-10-CM

## 2013-09-05 MED ORDER — LIDOCAINE 5 % EX PTCH
1.0000 | MEDICATED_PATCH | CUTANEOUS | Status: DC
  Administered 2013-09-05 – 2013-09-07 (×3): 1 via TRANSDERMAL
  Filled 2013-09-05 (×3): qty 1
  Filled 2013-09-05: qty 14
  Filled 2013-09-05 (×2): qty 1

## 2013-09-05 MED ORDER — BUSPIRONE HCL 15 MG PO TABS
7.5000 mg | ORAL_TABLET | Freq: Two times a day (BID) | ORAL | Status: DC
  Administered 2013-09-05 – 2013-09-09 (×7): 7.5 mg via ORAL
  Filled 2013-09-05 (×6): qty 1
  Filled 2013-09-05: qty 28
  Filled 2013-09-05 (×4): qty 1
  Filled 2013-09-05: qty 28
  Filled 2013-09-05 (×2): qty 1

## 2013-09-05 MED ORDER — CALCIUM POLYCARBOPHIL 625 MG PO TABS
625.0000 mg | ORAL_TABLET | Freq: Every day | ORAL | Status: DC
  Administered 2013-09-05 – 2013-09-07 (×3): 625 mg via ORAL
  Administered 2013-09-08: 08:00:00 via ORAL
  Administered 2013-09-09: 625 mg via ORAL
  Filled 2013-09-05 (×7): qty 1

## 2013-09-05 MED ORDER — DOCUSATE SODIUM 100 MG PO CAPS
100.0000 mg | ORAL_CAPSULE | Freq: Every day | ORAL | Status: DC | PRN
  Administered 2013-09-06: 100 mg via ORAL
  Filled 2013-09-05: qty 1

## 2013-09-05 MED ORDER — QUETIAPINE FUMARATE 100 MG PO TABS
100.0000 mg | ORAL_TABLET | Freq: Every day | ORAL | Status: DC
  Administered 2013-09-05 – 2013-09-06 (×2): 100 mg via ORAL
  Filled 2013-09-05 (×3): qty 1

## 2013-09-05 MED ORDER — QUETIAPINE FUMARATE 25 MG PO TABS
25.0000 mg | ORAL_TABLET | Freq: Three times a day (TID) | ORAL | Status: DC
  Administered 2013-09-05 – 2013-09-06 (×3): 25 mg via ORAL
  Filled 2013-09-05 (×8): qty 1

## 2013-09-05 NOTE — Progress Notes (Signed)
[facility] MD Progress Note  09/05/2013 3:31 PM [place]  MRN:  [id number] Subjective:  Met with patient to follow up with his withdrawal symptoms. He denies SI/HI but notes he is extremely anxious today and did not sleep well last pm. He states the Vistaril does not work for hiim.  He is making every effort to get through this and wants the staff to know that he is serious and dedicated to becoming clean and sober.    Discussed [name] notifying his PCP provider that he no longer wants to use narcotics and would like his help doing this.  He will sleep on this and together with this provider we will call his PCP tomorrow.      Also discussed with [name] going into residential Rehab when he is discharged from [facility] That is not something that he has done that would help solidify the skills he will need to to stay clean and sober.  I have suggested [facility], [facility], or [facility].  He will give this somethought and will discuss it again tomorrow. Diagnosis:   DSM5: Schizophrenia Disorders:   Obsessive-Compulsive Disorders:   Trauma-Stressor Disorders:   Substance/Addictive Disorders:  Opiate dependence Depressive Disorders:   Total Time spent with patient:  ADL's:  Intact  Sleep: Poor  Appetite:  Fair  Suicidal Ideation:  denies Homicidal Ideation:  denies AEB (as evidenced by):  Psychiatric Specialty Exam: Physical Exam  ROS  Blood pressure 107/87, pulse 96, temperature 97.5 F (36.4 C), temperature source Oral, resp. rate 18, height $RemoveBe'5\' 8"'UJLddAZfb$  (1.727 m), weight 88.905 kg (196 lb).Body mass index is 29.81 kg/(m^2).  General Appearance: Casual  Eye Contact::  Fair  Speech:  Clear and Coherent  Volume:  Normal  Mood:  Depressed  Affect:  Congruent  Thought Process:  Goal Directed  Orientation:  Full (Time, Place, and Person)  Thought Content:  NA  Suicidal Thoughts:  No  Homicidal Thoughts:  No  Memory:  NA  Judgement:  Intact  Insight:  Shallow  Psychomotor Activity:  Normal   Concentration:  Fair  Recall:  [place] of Knowledge:Good  Language: Good  Akathisia:  No  Handed:  Right  AIMS (if indicated):     Assets:  Communication Skills Desire for [place] Support  Sleep:  Number of Hours: 3   Musculoskeletal: Strength & Muscle Tone: within normal limits Gait & Station: normal Patient leans: N/A  Current Medications: Current Facility-Administered Medications  Medication Dose Route Frequency Provider Last Rate Last Dose  . acetaminophen (TYLENOL) tablet 650 mg  650 mg Oral Q6H PRN Encarnacion Slates, NP   650 mg at 09/04/13 2206  . alum & mag hydroxide-simeth (MAALOX/MYLANTA) 200-200-20 MG/5ML suspension 30 mL  30 mL Oral Q4H PRN Encarnacion Slates, NP      . buPROPion (WELLBUTRIN XL) 24 hr tablet 150 mg  150 mg Oral Daily Nena Polio, PA-C   150 mg at 09/05/13 4081  . cloNIDine (CATAPRES) tablet 0.1 mg  0.1 mg Oral QID Encarnacion Slates, NP   0.1 mg at 09/05/13 1213   Followed by  . [START ON 09/06/2013] cloNIDine (CATAPRES) tablet 0.1 mg  0.1 mg Oral BH-qamhs Encarnacion Slates, NP       Followed by  . [START ON 09/08/2013] cloNIDine (CATAPRES) tablet 0.1 mg  0.1 mg Oral QAC breakfast Encarnacion Slates, NP      . dicyclomine (BENTYL) tablet 20 mg  20 mg  Oral Q6H PRN Encarnacion Slates, NP   20 mg at 09/03/13 1732  . DULoxetine (CYMBALTA) DR capsule 60 mg  60 mg Oral Daily Nena Polio, PA-C   60 mg at 09/05/13 0743  . elvitegravir-cobicistat-emtricitabine-tenofovir (STRIBILD) 150-150-200-300 MG tablet 1 tablet  1 tablet Oral Q breakfast Nena Polio, PA-C   1 tablet at 09/05/13 0743  . gabapentin (NEURONTIN) capsule 300 mg  300 mg Oral TID Durward Parcel, MD   300 mg at 09/05/13 1213  . gemfibrozil (LOPID) tablet 600 mg  600 mg Oral BID AC Nena Polio, PA-C   600 mg at 09/05/13 1191  . hydrOXYzine (ATARAX/VISTARIL) tablet 25 mg  25 mg Oral Q6H PRN Encarnacion Slates, NP   25 mg at 09/05/13 4782  . hydrOXYzine (ATARAX/VISTARIL) tablet  50 mg  50 mg Oral QHS PRN Nena Polio, PA-C   50 mg at 09/04/13 2105  . lidocaine (LIDODERM) 5 % 1 patch  1 patch Transdermal Q24H Nena Polio, PA-C      . loperamide (IMODIUM) capsule 2-4 mg  2-4 mg Oral PRN Encarnacion Slates, NP   4 mg at 09/03/13 1732  . magnesium hydroxide (MILK OF MAGNESIA) suspension 30 mL  30 mL Oral Daily PRN Encarnacion Slates, NP      . methocarbamol (ROBAXIN) tablet 500 mg  500 mg Oral Q8H PRN Encarnacion Slates, NP   500 mg at 09/05/13 0449  . multivitamin with minerals tablet 1 tablet  1 tablet Oral Daily Nena Polio, PA-C   1 tablet at 09/05/13 0744  . naproxen (NAPROSYN) tablet 500 mg  500 mg Oral TID WC Nena Polio, PA-C   500 mg at 09/05/13 1215  . nicotine (NICODERM CQ - dosed in mg/24 hours) patch 21 mg  21 mg Transdermal Q0600 Encarnacion Slates, NP      . ondansetron (ZOFRAN-ODT) disintegrating tablet 4 mg  4 mg Oral Q6H PRN Encarnacion Slates, NP      . pantoprazole (PROTONIX) EC tablet 40 mg  40 mg Oral Q1200 Nena Polio, PA-C   40 mg at 09/05/13 1215  . QUEtiapine (SEROQUEL) tablet 100 mg  100 mg Oral QHS Nena Polio, PA-C      . QUEtiapine (SEROQUEL) tablet 25 mg  25 mg Oral TID Nena Polio, PA-C      . ramelteon (ROZEREM) tablet 8 mg  8 mg Oral QHS Lurena Nida, NP   8 mg at 09/04/13 2239    Lab Results: No results found for this or any previous visit (from the past 48 hour(s)).  Physical Findings: AIMS: Facial and Oral Movements Muscles of Facial Expression: None, normal Lips and Perioral Area: None, normal Jaw: None, normal Tongue: None, normal,Extremity Movements Upper (arms, wrists, hands, fingers): None, normal Lower (legs, knees, ankles, toes): None, normal, Trunk Movements Neck, shoulders, hips: None, normal, Overall Severity Severity of abnormal movements (highest score from questions above): None, normal Incapacitation due to abnormal movements: None, normal Patient's awareness of abnormal movements (rate only patient's report): No Awareness,  Dental Status Current problems with teeth and/or dentures?: No Does patient usually wear dentures?: No  CIWA:  CIWA-Ar Total: 2 COWS:  COWS Total Score: 3  Treatment Plan Summary: Daily contact with patient to assess and evaluate symptoms and progress in treatment Medication management  Plan: 1. Continue crisis management and stabilization. 2. Continue the clonidine protocol. 3. Will add Serouquel 25 mg po TID for anxiety. 4. Will add Buspar 7.5 po BID  for anxiety. 5. Will add Lidoderm patches for pain. 6. Will continue all other meds as written. 7. Disposition in progress. 8. Will add Fiber Con and Colace as written for constipation. Medical Decision Making Problem Points:  Established problem, stable/improving (1) Data Points:  Review or order medicine tests (1)  I certify that inpatient services furnished can reasonably be expected to improve the patient's condition.   Nena Polio 09/05/2013, 3:31 PM Agree with assessment and plan Geralyn Flash A. Sabra Heck, M.D.

## 2013-09-05 NOTE — Progress Notes (Signed)
D:  Patient's self inventory sheet, patient needs sleep medications, improving appetite, low energy level, poor attention span.  Rated depression, hopeless and anxiety 9.  Stated he continues to experience tremors, diarrhea, chilling, agitation, sweating in past 24 hours.  Also experienced pain, headaches, sweating/ pain in left leg from upper leg to lower leg, tingling in arms/legs when trying to sleep.  Pain goal today 7-9, worst pain 7-8.  Plans to continue what he needs to be doing after discharge.  Needs sleep medications. A:  Medications administered per MD orders.  Emotional support and encouragement given patient. R:  Denied HI.  SI off/on, contracts for safety.  Denied A/V hallucinations.  Will continue to monitor patient for safety with 15 minute checks.  Safety maintained.

## 2013-09-05 NOTE — Tx Team (Signed)
Interdisciplinary Treatment Plan Update (Adult)  Date: 09/05/2013  Time Reviewed:  9:45 AM  Progress in Treatment: Attending groups: Yes Participating in groups:  Yes Taking medication as prescribed:  Yes Tolerating medication:  Yes Family/Significant othe contact made: CSW assessing  Patient understands diagnosis:  Yes Discussing patient identified problems/goals with staff:  Yes Medical problems stabilized or resolved:  Yes Denies suicidal/homicidal ideation: Yes Issues/concerns per patient self-inventory:  Yes Other:  New problem(s) identified: N/A  Discharge Plan or Barriers: CSW assessing for appropriate referrals.  Reason for Continuation of Hospitalization: Anxiety Depression Medication Stabilization  Comments: N/A  Estimated length of stay: 3-5 days  For review of initial/current patient goals, please see plan of care.  Attendees: Patient:    Family:     Physician:  09/05/2013 10:24 AM   Nursing:   Quintella ReichertBeverly Knight, RN 09/05/2013 10:24 AM   Clinical Social Worker:  Reyes Ivanhelsea Horton, LCSW 09/05/2013 10:24 AM   Other: Verne SpurrNeil Mashburn, PA 09/05/2013 10:24 AM   Other:  Sherrye PayorValerie Noch, care coordination 09/05/2013 10:24 AM   Other:  Marzetta Boardhrista Dopson, RN 09/05/2013 10:24 AM   Other:  Onnie BoerJennifer Clark, case manager 09/05/2013 10:25 AM   Other: Anthony SarAlvin Oung, pharmacy resident 09/05/2013 10:25 AM   Other:    Other:    Other:    Other:      Scribe for Treatment Team:   Carmina MillerHorton, Uriyah Raska Nicole, 09/05/2013 , 10:24 AM

## 2013-09-05 NOTE — BHH Counselor (Signed)
Adult Psychosocial Assessment Update Interdisciplinary Team  [facility] admissions[facility]discharges:  Admissions Discharges  Date: 08/06/13 Date: 08/10/13  Date: 05/27/13 Date: 05/31/13  Date: Date:  Date: Date:  Date: Date:   Changes since the last Psychosocial Assessment (including adherence to outpatient mental health and/or substance abuse treatment, situational issues contributing to decompensation and/or relapse). Pt [facility] but reports sitting for 2 hours at [facility] and getting frustrated with the wait so he left.  Pt states that he hasn't followed up with an outpatient provider yet.  Pt states that he was on fentanyl patches for pain but is tired of being on them, stating he's too young to be dealing with pain.  Pt states that he took himself off of the fentanyl patches and then had withdrawal symptoms of depression and SI, which resulted in him coming here.  Pt states that he has great support in his family and partner.             Discharge Plan 1. Will you be returning to the same living situation after discharge?   Yes: X Pt can [place] No:      If no, what is your plan?           2. Would you like a referral for services when you are discharged? Yes: X    If yes, for what services? [facility] back [facility].    No:              Summary and Recommendations (to be completed by the evaluator) Patient is a 33 year old Caucasian Male with a diagnosis of Major Depressive Disorder, Recurrent, Severe Without Psychotic Features, Posttraumatic Stress Disorder, Anxiety Disorder NOS.  [place] in [place].  Patient will benefit from crisis stabilization, medication evaluation, group therapy and psycho education in addition to case management for discharge planning.                         Signature:  Hoyle SauerChelsea N Horton, 09/05/2013 8:12  AM

## 2013-09-05 NOTE — [facility] Group Notes (Signed)
[facility]   09/05/2013 8:45 AM  Participation Quality:  Alert, Appropriate and Oriented  Mood/Affect:  Flat and Depressed  Depression Rating:  7  Anxiety Rating:  8  Thoughts of Suicide:  Pt denies SI/HI  Will you contract for safety?   Yes  Current AVH:  Pt denies  Plan for Discharge/Comments:  Pt attended discharge planning group and actively participated in group.  CSW provided pt with today's workbook.  Pt states that he plans to return home in [place] and look for a job.  Pt denies having any follow up.  CSW will assess for appropriate referrals.  No further needs voiced by pt at this time.    Transportation Means: Pt reports access to transportation - partner will pick pt up  Supports: No supports mentioned at this time  Reyes IvanChelsea Horton, LCSW 09/05/2013 10:06 AM

## 2013-09-05 NOTE — Progress Notes (Signed)
Pt not able to sleep, pt very restles

## 2013-09-05 NOTE — Progress Notes (Signed)
[facility] Group Notes:  (Nursing/MHT/Case Management/Adjunct)  Date:  09/05/2013  Time:  12:10 AM  Type of Therapy:  Group Therapy  Participation Level:  Active  Participation Quality:  Appropriate  Affect:  Appropriate  Cognitive:  Appropriate  Insight:  Appropriate  Engagement in Group:  Engaged  Modes of Intervention:  Socialization and Support  Summary of Progress/Problems: Pt. Stated he wanted to develop healthy coping skills to work towards resolving conflict.  Sondra ComeRyan J Tonnette Zwiebel 09/05/2013, 12:10 AM

## 2013-09-05 NOTE — [facility] Group Notes (Signed)
[facility] LCSW Group Therapy  09/05/2013   1:15 PM   Type of Therapy:  Group Therapy  Participation Level:  Active  Participation Quality:  Attentive, Sharing and Supportive  Affect:  Depressed and Flat  Cognitive:  Alert and Oriented  Insight:  Developing/Improving and Engaged  Engagement in Therapy:  Developing/Improving and Engaged  Modes of Intervention:  Clarification, Confrontation, Discussion, Education, Exploration, Limit-setting, Orientation, Problem-solving, Rapport Building, Dance movement psychotherapisteality Testing, Socialization and Support  Summary of Progress/Problems: Pt identified obstacles faced currently and processed barriers involved in overcoming these obstacles. Pt identified steps necessary for overcoming these obstacles and explored motivation (internal and external) for facing these difficulties head on. Pt further identified one area of concern in their lives and chose a goal to focus on for today. Pt shared that his biggest obstacle is dealing with the pain without pain meds.  Pt was able to process how he will overcome this pain, stating that he has been through worse.  Pt discussed wanting to get back to his old self and what this would look like.  Pt actively participated and was engaged in group discussion.    Reyes IvanChelsea Horton, LCSW 09/05/2013  3:42 PM

## 2013-09-05 NOTE — Progress Notes (Signed)
Psychoeducational Group Note  Date:  09/05/2013 Time:  8:00 p.m.   Group Topic/Focus:  Wrap-Up Group:   The focus of this group is to help patients review their daily goal of treatment and discuss progress on daily workbooks.  Participation Level: Did Not Attend  Participation Quality:  Not Applicable  Affect:  Not Applicable  Cognitive:  Not Applicable  Insight:  Not Applicable  Engagement in Group: Not Applicable  Additional Comments:  The patient did not attend group this evening since he was asleep.   Westly PamBenjamin S Caspian Deleonardis 09/05/2013, 9:37 PM

## 2013-09-06 MED ORDER — QUETIAPINE FUMARATE 25 MG PO TABS: 25.0000 mg | ORAL_TABLET | Freq: Three times a day (TID) | ORAL | Status: DC | PRN

## 2013-09-06 MED ORDER — GABAPENTIN 400 MG PO CAPS
400.0000 mg | ORAL_CAPSULE | Freq: Three times a day (TID) | ORAL | Status: DC
  Administered 2013-09-06 – 2013-09-08 (×5): 400 mg via ORAL
  Filled 2013-09-06 (×11): qty 1

## 2013-09-06 MED ORDER — CELECOXIB 200 MG PO CAPS
200.0000 mg | ORAL_CAPSULE | Freq: Every day | ORAL | Status: DC
  Administered 2013-09-06: 200 mg via ORAL
  Filled 2013-09-06: qty 1
  Filled 2013-09-06: qty 2
  Filled 2013-09-06 (×2): qty 1

## 2013-09-06 MED ORDER — CYCLOBENZAPRINE HCL 10 MG PO TABS
10.0000 mg | ORAL_TABLET | Freq: Three times a day (TID) | ORAL | Status: DC | PRN
  Administered 2013-09-06 – 2013-09-09 (×8): 10 mg via ORAL
  Filled 2013-09-06: qty 42
  Filled 2013-09-06 (×8): qty 2

## 2013-09-06 NOTE — Progress Notes (Signed)
The focus of this group is to educate the patient on the purpose and policies of crisis stabilization and provide a format to answer questions about their admission.  The group details unit policies and expectations of patients while admitted. Patient did not attend this group. 

## 2013-09-06 NOTE — [facility] Group Notes (Signed)
[facility] LCSW Group Therapy  Feelings Around Diagnosis 1:15 - 2:30 PM  09/06/2013 3:18 PM  Type of Therapy:  Group Therapy  Participation Level:  Did Not Attend   [place] 09/06/2013, 3:18 PM

## 2013-09-06 NOTE — Progress Notes (Signed)
Recreation Therapy Notes  Animal-Assisted Activity/Therapy (AAA/T) Program Checklist/Progress Notes Patient Eligibility Criteria Checklist & Daily Group note for Rec Tx Intervention  Date: 04.21.2015 Time: 2:45pm Location: [address] Dayroom    AAA/T Program Assumption of Risk Form signed by Patient/ or Parent Legal Guardian yes  Patient is free of allergies or sever asthma yes  Patient reports no fear of animals yes  Patient reports no history of cruelty to animals yes   Patient understands his/her participation is voluntary yes  Patient washes hands before animal contact yes  Patient washes hands after animal contact yes  Behavioral Response: Appropriate  Education: Hand Washing, Appropriate Animal Interaction   Education Outcome: Acknowledges understanding  Clinical Observations/Feedback: Patient attended group session, but was walked in and out of group repeatedly. The time he spent in group he stared out the window and had minimal interaction with therapy dog.   Marykay Lexenise L Nerine Pulse, LRT/CTRS  Marykay LexDenise L Haelyn Forgey 09/06/2013 4:24 PM

## 2013-09-06 NOTE — Progress Notes (Signed)
Patient ID: [place], male   DOB: 10/19/1980, 33 y.o.   MRN: [id number] [facility] Progress Note  09/06/2013 2:11 PM [place]  MRN:  [id number] Subjective:  [place] reports that he is not doing well and that he is having both pain and anxiety. He feels that the Seroquel is "zoning him out." He states his anxiety is severe and his pain is out of control.    He appears sleepy and drowsy and seems to be maximizing his symptoms. He continues to have a defeatist negative attitude with poor outcome potential increased due to his continued negative "self full filling prophecy".     This provider checked his pulse which was steady at 100.   Diagnosis:   DSM5: Schizophrenia Disorders:   Obsessive-Compulsive Disorders:   Trauma-Stressor Disorders:   Substance/Addictive Disorders:  Opiate dependence Depressive Disorders:   Total Time spent with patient:  ADL's:  Intact  Sleep: Poor  Appetite:  Fair  Suicidal Ideation:  denies Homicidal Ideation:  denies AEB (as evidenced by):  Psychiatric Specialty Exam: Physical Exam  ROS  Blood pressure 109/76, pulse 105, temperature 98.3 F (36.8 C), temperature source Oral, resp. rate 16, height 5\' 8"  (1.727 m), weight 88.905 kg (196 lb).Body mass index is 29.81 kg/(m^2).  General Appearance: Casual  Eye Contact::  Fair  Speech:  Clear and Coherent  Volume:  Normal  Mood:  Depressed  Affect:  Congruent  Thought Process:  Goal Directed  Orientation:  Full (Time, Place, and Person)  Thought Content:  NA  Suicidal Thoughts:  No  Homicidal Thoughts:  No  Memory:  NA  Judgement:  Intact  Insight:  Shallow  Psychomotor Activity:  Normal  Concentration:  Fair  Recall:  Fair  Fund of Knowledge:Good  Language: Good  Akathisia:  No  Handed:  Right  AIMS (if indicated):     Assets:  Communication Skills Desire for Improvement Housing Physical Health Social Support  Sleep:  Number of Hours: 4.75   Musculoskeletal: Strength & Muscle  Tone: within normal limits Gait & Station: normal Patient leans: N/A  Current Medications: Current Facility-Administered Medications  Medication Dose Route Frequency Provider Last Rate Last Dose  . acetaminophen (TYLENOL) tablet 650 mg  650 mg Oral Q6H PRN Sanjuana KavaAgnes I Nwoko, NP   650 mg at 09/06/13 0858  . alum & mag hydroxide-simeth (MAALOX/MYLANTA) 200-200-20 MG/5ML suspension 30 mL  30 mL Oral Q4H PRN Sanjuana KavaAgnes I Nwoko, NP      . buPROPion (WELLBUTRIN XL) 24 hr tablet 150 mg  150 mg Oral Daily Verne SpurrNeil Mashburn, PA-C   150 mg at 09/06/13 0748  . busPIRone (BUSPAR) tablet 7.5 mg  7.5 mg Oral BID Verne SpurrNeil Mashburn, PA-C   7.5 mg at 09/06/13 0748  . cloNIDine (CATAPRES) tablet 0.1 mg  0.1 mg Oral BH-qamhs Sanjuana KavaAgnes I Nwoko, NP   0.1 mg at 09/06/13 0748   Followed by  . [START ON 09/08/2013] cloNIDine (CATAPRES) tablet 0.1 mg  0.1 mg Oral QAC breakfast Sanjuana KavaAgnes I Nwoko, NP      . dicyclomine (BENTYL) tablet 20 mg  20 mg Oral Q6H PRN Sanjuana KavaAgnes I Nwoko, NP   20 mg at 09/03/13 1732  . docusate sodium (COLACE) capsule 100 mg  100 mg Oral Daily PRN Verne SpurrNeil Mashburn, PA-C      . DULoxetine (CYMBALTA) DR capsule 60 mg  60 mg Oral Daily Verne SpurrNeil Mashburn, PA-C   60 mg at 09/06/13 0748  . elvitegravir-cobicistat-emtricitabine-tenofovir (STRIBILD) 150-150-200-300 MG tablet 1  tablet  1 tablet Oral Q breakfast Verne SpurrNeil Mashburn, PA-C   1 tablet at 09/06/13 [phone]  . gabapentin (NEURONTIN) capsule 300 mg  300 mg Oral TID Nehemiah SettleJanardhaha R Jonnalagadda, MD   300 mg at 09/06/13 1140  . gemfibrozil (LOPID) tablet 600 mg  600 mg Oral BID AC Verne SpurrNeil Mashburn, PA-C   600 mg at 09/06/13 [id number]  . hydrOXYzine (ATARAX/VISTARIL) tablet 25 mg  25 mg Oral Q6H PRN Sanjuana KavaAgnes I Nwoko, NP   25 mg at 09/06/13 1108  . hydrOXYzine (ATARAX/VISTARIL) tablet 50 mg  50 mg Oral QHS PRN Verne SpurrNeil Mashburn, PA-C   50 mg at 09/05/13 2311  . lidocaine (LIDODERM) 5 % 1 patch  1 patch Transdermal Q24H Verne SpurrNeil Mashburn, PA-C   1 patch at 09/05/13 1549  . loperamide (IMODIUM) capsule 2-4 mg  2-4 mg  Oral PRN Sanjuana KavaAgnes I Nwoko, NP   4 mg at 09/03/13 1732  . magnesium hydroxide (MILK OF MAGNESIA) suspension 30 mL  30 mL Oral Daily PRN Sanjuana KavaAgnes I Nwoko, NP      . methocarbamol (ROBAXIN) tablet 500 mg  500 mg Oral Q8H PRN Sanjuana KavaAgnes I Nwoko, NP   500 mg at 09/06/13 1142  . multivitamin with minerals tablet 1 tablet  1 tablet Oral Daily Verne SpurrNeil Mashburn, PA-C   1 tablet at 09/06/13 0748  . naproxen (NAPROSYN) tablet 500 mg  500 mg Oral TID WC Verne SpurrNeil Mashburn, PA-C   500 mg at 09/06/13 1140  . nicotine (NICODERM CQ - dosed in mg/24 hours) patch 21 mg  21 mg Transdermal Q0600 Sanjuana KavaAgnes I Nwoko, NP      . ondansetron (ZOFRAN-ODT) disintegrating tablet 4 mg  4 mg Oral Q6H PRN Sanjuana KavaAgnes I Nwoko, NP      . pantoprazole (PROTONIX) EC tablet 40 mg  40 mg Oral Q1200 Verne SpurrNeil Mashburn, PA-C   40 mg at 09/06/13 1140  . polycarbophil (FIBERCON) tablet 625 mg  625 mg Oral Daily Verne SpurrNeil Mashburn, PA-C   625 mg at 09/06/13 0747  . QUEtiapine (SEROQUEL) tablet 100 mg  100 mg Oral QHS Verne SpurrNeil Mashburn, PA-C   100 mg at 09/05/13 2125  . QUEtiapine (SEROQUEL) tablet 25 mg  25 mg Oral TID Verne SpurrNeil Mashburn, PA-C   25 mg at 09/06/13 1140  . ramelteon (ROZEREM) tablet 8 mg  8 mg Oral QHS Kristeen MansFran E Hobson, NP   8 mg at 09/05/13 2125    Lab Results: No results found for this or any previous visit (from the past 48 hour(s)).  Physical Findings: AIMS: Facial and Oral Movements Muscles of Facial Expression: None, normal Lips and Perioral Area: None, normal Jaw: None, normal Tongue: None, normal,Extremity Movements Upper (arms, wrists, hands, fingers): None, normal Lower (legs, knees, ankles, toes): None, normal, Trunk Movements Neck, shoulders, hips: None, normal, Overall Severity Severity of abnormal movements (highest score from questions above): None, normal Incapacitation due to abnormal movements: None, normal Patient's awareness of abnormal movements (rate only patient's report): No Awareness, Dental Status Current problems with teeth and/or  dentures?: No Does patient usually wear dentures?: No  CIWA:  CIWA-Ar Total: 2 COWS:  COWS Total Score: 2  Treatment Plan Summary: Daily contact with patient to assess and evaluate symptoms and progress in treatment Medication management  Plan: 1. Will change Seroquel 25mg  po TID to prn so patient doesn't have to take it if he feels that he doesn't need it. 2. Will increase his Neurontin to 400mg  po TID. 3. Will try Celebrex 200mg  q d for joint  pain. 4. Will make sure he is given colace as it is ordered. 5. For his part he will try to avoid negative thinking pattern. 6. He will be going to all groups and he is asked to stay out of his bed during the day. 7. Disposition is in progress. Medical Decision Making Problem Points:  Established problem, stable/improving (1) Data Points:  Review or order medicine tests (1)  I certify that inpatient services furnished can reasonably be expected to improve the patient's condition.   Verne Spurr 09/06/2013, 2:11 PM Agree with assessment and plan Madie Reno A. Dub Mikes, M.D.

## 2013-09-06 NOTE — Progress Notes (Signed)
Pt reports he is doing a little better this evening.  He says he has gone to groups today.  He is still depressed/anxious and says he is frustrated that it is difficult to get relief for his pain.  He wants to get off of narcotics and is willing to go to longer treatment if he needs to.  He denies SI/HI/AV at this time.  Pt is cooperative and pleasant this evening.  Support and encouragement offered.  Safety maintained with q15 minute checks.

## 2013-09-06 NOTE — Progress Notes (Signed)
Adult Psychoeducational Group Note  Date:  09/06/2013 Time:  11:38 PM  Group Topic/Focus:  Wrap-Up Group:   The focus of this group is to help patients review their daily goal of treatment and discuss progress on daily workbooks.  Participation Level:  Active  Participation Quality:  Appropriate  Affect:  Appropriate  Cognitive:  Appropriate  Insight: Appropriate  Engagement in Group:  Engaged  Modes of Intervention:  Discussion  Additional Comments:  Pt was present for wrap up group. He said he had a good day because he got to sleep a lot- he has not been able to sleep well for a long time. But he also felt guilty about sleeping. Pt stated that he had a good day because he got to meet new friends; his peers.   Devin Crawford A [name] 09/06/2013, 11:38 PM

## 2013-09-06 NOTE — Progress Notes (Signed)
D: Patient denies SI/HI and A/V hallucinations; patient reports sleep was poor and that he did not get any sleep and that this is the fourth night; patient reports " Im mentally exhausted"; patient also reporting some drowsiness and hot flashes, and anxiety and vitals signs rechecked and PA Mashburn made aware in the presence of the patient; reports appetite is good; reports energy level is improving ; reports ability to pay attention is improving; rates depression as 6/10; rates hopelessness 6/10;  A: Monitored q 15 minutes; patient encouraged to attend groups; patient educated about medications; patient given medications per physician orders; patient encouraged to express feelings and/or concerns  R: Patient is assertive and needy; patient is sad and irritable; patient is cooperative but patient is fatigue due to the lack of sleep and has been in the bed all day; patient is pleasant but the patient overwhelms himself and make statements like " I know Im going to have a panic attack";   patient's interaction with staff and peers is appropriate; patient was able to set goal to talk with staff 1:1 when having feelings of SI

## 2013-09-07 MED ORDER — CLONAZEPAM 1 MG PO TABS
1.0000 mg | ORAL_TABLET | Freq: Two times a day (BID) | ORAL | Status: DC
  Administered 2013-09-07 – 2013-09-09 (×4): 1 mg via ORAL
  Filled 2013-09-07: qty 1
  Filled 2013-09-07 (×2): qty 28
  Filled 2013-09-07: qty 1
  Filled 2013-09-07: qty 28
  Filled 2013-09-07 (×2): qty 1
  Filled 2013-09-07: qty 28

## 2013-09-07 MED ORDER — CELECOXIB 100 MG PO CAPS
200.0000 mg | ORAL_CAPSULE | Freq: Two times a day (BID) | ORAL | Status: DC
  Administered 2013-09-07 – 2013-09-09 (×4): 200 mg via ORAL
  Filled 2013-09-07 (×4): qty 1
  Filled 2013-09-07 (×2): qty 56
  Filled 2013-09-07 (×2): qty 1

## 2013-09-07 MED ORDER — QUETIAPINE FUMARATE 200 MG PO TABS
200.0000 mg | ORAL_TABLET | Freq: Every day | ORAL | Status: DC
  Administered 2013-09-07 – 2013-09-08 (×2): 200 mg via ORAL
  Filled 2013-09-07: qty 1
  Filled 2013-09-07: qty 14
  Filled 2013-09-07 (×2): qty 1

## 2013-09-07 NOTE — [facility] Group Notes (Signed)
[facility] LCSW Group Therapy  Emotional Regulation 1:15 - 2: 30 PM        09/07/2013     Type of Therapy:  Group Therapy  Participation Level:  Appropriate  Participation Quality:  Appropriate  Affect:  Appropriate  Cognitive:  Attentive Appropriate  Insight:  Developing/Improving Engaged  Engagement in Therapy:  Developing/Improving Engaged  Modes of Intervention:  Discussion Exploration Problem-Solving Supportive  Summary of Progress/Problems:  Group topic was emotional regulations.  Patient participated in the discussion and was able to identify an emotion that needed to regulated.  Patient talked about fear as the emotions he has to regulate. He shared he fears being alone and he fears the unknown.  Patient was able to identify approprite coping skills.  [name], Torryn Hudspeth Hairston 09/07/2013

## 2013-09-07 NOTE — Progress Notes (Signed)
[facility] :  Will you be returning to the same living situation after discharge: Yes,  Patient is returning to his home. At discharge, do you have transportation home?:Yes,  Patient to arrange transportation home. Do you have the ability to pay for your medications:No.  Patient needs assistance with indigent medications.  Release of information consent forms completed and in the chart;  Patient's signature needed at discharge.  Patient to Follow up at: Follow-up Information   Follow up with [facility] On 09/08/2013. (Thursday, September 08, 2013 or any weekday between 8AM -3PM for medication management)    Contact information:   [address] [place], [place]   [id number]  [phone]      Patient denies SI/HI:   Patient no longer endorsing SI/HI or other thoughts of self harm.   Safety Planning and Suicide Prevention discussed: .Reviewed with all patients during discharge planning group   Devin Crawford 09/07/2013, 11:46 AM

## 2013-09-07 NOTE — Progress Notes (Signed)
Pt came to the medication window agitated and irritated. Pt said Clinical research associatewriter stated he was drinking water with bacteria in it. Pt said he was getting upset with this place because he came here to be weaned off  pain meds not to be taken off his anxiety meds.

## 2013-09-07 NOTE — Tx Team (Signed)
Interdisciplinary Treatment Plan Update   Date Reviewed:  09/07/2013  Time Reviewed:  9:29 AM  Progress in Treatment:   Attending groups: Yes Participating in groups: Yes Taking medication as prescribed: Yes  Tolerating medication: Yes Family/Significant other contact made:  No, patient rescinded consent for collateral contact Patient understands diagnosis: Yes  Discussing patient identified problems/goals with staff: Yes Medical problems stabilized or resolved: Yes Denies suicidal/homicidal ideation: Yes Patient has not harmed self or others: Yes  For review of initial/current patient goals, please see plan of care.  Estimated Length of Stay:  Discharge today  Reasons for Continued Hospitalization:   New Problems/Goals identified:    Discharge Plan or Barriers:   Home with outpatient follow up with [facility]  Additional Comments:  Attendees:  Patient:  Devin RiseBenjamin Pigue 09/07/2013 9:29 AM   Signature:  09/07/2013 9:29 AM  Signature:  Verne SpurrNeil Mashburn, PA 09/07/2013 9:29 AM  Signature:   09/07/2013 9:29 AM  Signature: Langley AdieAdam Miller, RN 09/07/2013 9:29 AM  Signature:   09/07/2013 9:29 AM  Signature:  Juline PatchQuylle Britten Seyfried, LCSW 09/07/2013 9:29 AM  Signature:  Reyes Ivanhelsea Horton, LCSW 09/07/2013 9:29 AM  Signature:  Leisa LenzValerie Enoch, Care Coordinator [facility] 09/07/2013 9:29 AM  Signature:   09/07/2013 9:29 AM  Signature: Leighton ParodyBritney Tyson, RN 09/07/2013  9:29 AM  Signature:   Onnie BoerJennifer Clark, RN [facility] 09/07/2013  9:29 AM  Signature:  Harold Barbanonecia Byrd, RN 09/07/2013  9:29 AM    Scribe for Treatment Team:   Juline PatchQuylle Chanese Hartsough,  09/07/2013 9:29 AM

## 2013-09-07 NOTE — [facility] Group Notes (Signed)
[facility] Note   09/07/2013 11:44 AM    Participation Quality:  Appropraite  Mood/Affect:  Appropriate  Depression Rating:  3  Anxiety Rating:  10 (patient reports anxiety up due to conflict with a staff member.  Thoughts of Suicide:  No  Will you contract for safety?   NA  Current AVH:  No  Plan for Discharge/Comments:  Patient attended discharge planning group and actively participated in group.  He will follow up with [facility].  Patient stated he is rescinding consent for any contact due to conflict with a staff member.  He will allow [facility] to remain in [facility].  CSW provided all participants with daily workbook.   Transportation Means: Patient has transportation.   Supports:  Patient has a support system.   Farooq Petrovich, Joesph JulyQuylle Hairston

## 2013-09-07 NOTE — Clinical Social Work Note (Signed)
CSW met with patient who advised after meeting with DON and ADON he has decided not to discharge today.

## 2013-09-07 NOTE — Progress Notes (Signed)
Pt reports he is doing better this evening.  He says his day started out pretty bad, but it has gotten better.  He says the MD made some changes to his meds also.  He still c/o L leg pain and is receiving prn meds for this.  Pt is hopeful the med changes will help him rest tonight.  Pt denies SI/HI/AV.  Pt plans to return home at discharge.  Pt makes his needs known to staff.  Support and encouragement offered.  Safety maintained with q15 minute checks.

## 2013-09-07 NOTE — Progress Notes (Signed)
Pt got irritated this morning when asking for his Flexeril. Writer suggested that pt try to wait after his 8 am meds to take it. Pt started getting upset , being rude to Clinical research associatewriter and not giving the Clinical research associatewriter the opportunity to offer the [employer] IncFlexaril . Pt just started raising his voice and cursing at Clinical research associatewriter and stating he did not want to talk to the writer anymore.

## 2013-09-07 NOTE — Progress Notes (Signed)
D: Pt denies SI/HI/AVH. Pt is pleasant and cooperative. Pt has been exhibiting drug seeking behaviors. Pt continues to ask for whatever medication he can have and when he can have the next dose.   A: Pt was offered support and encouragement. Pt was given scheduled medications. Pt was encourage to attend groups. Q 15 minute checks were done for safety.   R:Pt attends groups and interacts with peers and staff. Pt is taking medication. Pt  safety maintained on unit.

## 2013-09-07 NOTE — Progress Notes (Signed)
Patient ID: Devin NeriBenjamin R Crawford, male   DOB: 04-Aug-1980, 33 y.o.   MRN: [id number] [facility]  09/07/2013 11:27 AM Devin NeriBenjamin R Cashaw  MRN:  [id number] Subjective:  Devin Crawford did not have a good day. He had a confrontation with a nurse this morning when he asked for his flexeril. The nurse asked him to wait until the 8 AM meds and a shouting match ensued between the two.  Devin Crawford then refused to take any of his morning medication and demanded to be discharged. He is brought into the treatment team to discuss his request.  He is anxious, distressed and dramatic, he continues to show poor ability to tolerate delayed gratification. Diagnosis:   DSM5: Schizophrenia Disorders:   Obsessive-Compulsive Disorders:   Trauma-Stressor Disorders:   Substance/Addictive Disorders:  Opiate dependence Depressive Disorders:   Total Time spent with patient:  ADL's:  Intact  Sleep: Poor  Appetite:  Fair  Suicidal Ideation:  denies Homicidal Ideation:  denies AEB (as evidenced by):  Psychiatric Specialty Exam: Physical Exam  ROS  Blood pressure 115/66, pulse 85, temperature 97.6 F (36.4 C), temperature source Oral, resp. rate 16, height 5\' 8"  (1.727 m), weight 88.905 kg (196 lb), SpO2 100.00%.Body mass index is 29.81 kg/(m^2).  General Appearance: Casual  Eye Contact::  Fair  Speech:  Clear and Coherent  Volume:  Normal  Mood:  Depressed  Affect:  Congruent  Thought Process:  Goal Directed  Orientation:  Full (Time, Place, and Person)  Thought Content:  NA  Suicidal Thoughts:  No  Homicidal Thoughts:  No  Memory:  NA  Judgement:  Intact  Insight:  Shallow  Psychomotor Activity:  Normal  Concentration:  Fair  Recall:  Fair  Fund of Knowledge:Good  Language: Good  Akathisia:  No  Handed:  Right  AIMS (if indicated):     Assets:  Communication Skills Desire for Improvement Housing Physical Health Social Support  Sleep:  Number of Hours: 4.75   Musculoskeletal: Strength & Muscle Tone:  within normal limits Gait & Station: normal Patient leans: N/A  Current Medications: Current Facility-Administered Medications  Medication Dose Route Frequency Provider Last Rate Last Dose  . acetaminophen (TYLENOL) tablet 650 mg  650 mg Oral Q6H PRN Sanjuana KavaAgnes I Nwoko, NP   650 mg at 09/06/13 [id number]  . alum & mag hydroxide-simeth (MAALOX/MYLANTA) 200-200-20 MG/5ML suspension 30 mL  30 mL Oral Q4H PRN Sanjuana KavaAgnes I Nwoko, NP      . buPROPion (WELLBUTRIN XL) 24 hr tablet 150 mg  150 mg Oral Daily Verne SpurrNeil Mashburn, PA-C   150 mg at 09/07/13 0825  . busPIRone (BUSPAR) tablet 7.5 mg  7.5 mg Oral BID Verne SpurrNeil Mashburn, PA-C   7.5 mg at 09/06/13 1722  . celecoxib (CELEBREX) capsule 200 mg  200 mg Oral BID Verne SpurrNeil Mashburn, PA-C      . clonazePAM Scarlette Calico(KLONOPIN) tablet 1 mg  1 mg Oral BID Verne SpurrNeil Mashburn, PA-C      . cloNIDine (CATAPRES) tablet 0.1 mg  0.1 mg Oral BH-qamhs Sanjuana KavaAgnes I Nwoko, NP   0.1 mg at 09/07/13 [id number]   Followed by  . [START ON 09/08/2013] cloNIDine (CATAPRES) tablet 0.1 mg  0.1 mg Oral QAC breakfast Sanjuana KavaAgnes I Nwoko, NP      . cyclobenzaprine (FLEXERIL) tablet 10 mg  10 mg Oral TID PRN Verne SpurrNeil Mashburn, PA-C   10 mg at 09/07/13 0649  . dicyclomine (BENTYL) tablet 20 mg  20 mg Oral Q6H PRN Sanjuana KavaAgnes I Nwoko, NP   20  mg at 09/03/13 1732  . docusate sodium (COLACE) capsule 100 mg  100 mg Oral Daily PRN Verne SpurrNeil Mashburn, PA-C   100 mg at 09/06/13 1506  . DULoxetine (CYMBALTA) DR capsule 60 mg  60 mg Oral Daily Verne SpurrNeil Mashburn, PA-C   60 mg at 09/07/13 [id number]  . elvitegravir-cobicistat-emtricitabine-tenofovir (STRIBILD) 150-150-200-300 MG tablet 1 tablet  1 tablet Oral Q breakfast Verne SpurrNeil Mashburn, PA-C   1 tablet at 09/07/13 [id number]  . gabapentin (NEURONTIN) capsule 400 mg  400 mg Oral TID Verne SpurrNeil Mashburn, PA-C   400 mg at 09/06/13 1722  . gemfibrozil (LOPID) tablet 600 mg  600 mg Oral BID AC Verne SpurrNeil Mashburn, PA-C   600 mg at 09/07/13 [id number]  . hydrOXYzine (ATARAX/VISTARIL) tablet 25 mg  25 mg Oral Q6H PRN Sanjuana KavaAgnes I Nwoko, NP   25 mg at 09/06/13  [id number]  . hydrOXYzine (ATARAX/VISTARIL) tablet 50 mg  50 mg Oral QHS PRN Verne SpurrNeil Mashburn, PA-C   50 mg at 09/05/13 2311  . lidocaine (LIDODERM) 5 % 1 patch  1 patch Transdermal Q24H Verne SpurrNeil Mashburn, PA-C   1 patch at 09/06/13 1724  . loperamide (IMODIUM) capsule 2-4 mg  2-4 mg Oral PRN Sanjuana KavaAgnes I Nwoko, NP   4 mg at 09/03/13 1732  . magnesium hydroxide (MILK OF MAGNESIA) suspension 30 mL  30 mL Oral Daily PRN Sanjuana KavaAgnes I Nwoko, NP      . multivitamin with minerals tablet 1 tablet  1 tablet Oral Daily Verne SpurrNeil Mashburn, PA-C   1 tablet at 09/07/13 0825  . nicotine (NICODERM CQ - dosed in mg/24 hours) patch 21 mg  21 mg Transdermal Q0600 Sanjuana KavaAgnes I Nwoko, NP      . ondansetron (ZOFRAN-ODT) disintegrating tablet 4 mg  4 mg Oral Q6H PRN Sanjuana KavaAgnes I Nwoko, NP      . pantoprazole (PROTONIX) EC tablet 40 mg  40 mg Oral Q1200 Verne SpurrNeil Mashburn, PA-C   40 mg at 09/06/13 1140  . polycarbophil (FIBERCON) tablet 625 mg  625 mg Oral Daily Verne SpurrNeil Mashburn, PA-C   625 mg at 09/07/13 0825  . QUEtiapine (SEROQUEL) tablet 200 mg  200 mg Oral QHS Verne SpurrNeil Mashburn, PA-C      . ramelteon (ROZEREM) tablet 8 mg  8 mg Oral QHS Kristeen MansFran E Hobson, NP   8 mg at 09/06/13 2232    Lab Results: No results found for this or any previous visit (from the past 48 hour(s)).  Physical Findings: AIMS: Facial and Oral Movements Muscles of Facial Expression: None, normal Lips and Perioral Area: None, normal Jaw: None, normal Tongue: None, normal,Extremity Movements Upper (arms, wrists, hands, fingers): None, normal Lower (legs, knees, ankles, toes): None, normal, Trunk Movements Neck, shoulders, hips: None, normal, Overall Severity Severity of abnormal movements (highest score from questions above): None, normal Incapacitation due to abnormal movements: None, normal Patient's awareness of abnormal movements (rate only patient's report): No Awareness, Dental Status Current problems with teeth and/or dentures?: No Does patient usually wear dentures?: No  CIWA:   CIWA-Ar Total: 2 COWS:  COWS Total Score: 2  Treatment Plan Summary: Daily contact with patient to assess and evaluate symptoms and progress in treatment Medication management  Plan: NEW: 1. Discussed with the patient our concern for his poor tolerance of minimal adversity and his ability to cope.  Praise for his progress so far in his detox. 2. Patient agreed to stay the day and to finish the detox. 3. Klonopin 1mg  po BID for anxiety is returned to his scheduled meds. 4.  Seroquel is d/c'd during the day and increased to 200mg  po at hs for sleep. 5. Celebrex is increased to BID as he has showed no reaction or hives. 6. Patient to talk with DON and ADON regarding interaction with staff member. 7. Disposition in process, anticipate d/c home tomorrow or Friday.  5. For his part he will try to avoid negative thinking pattern. 6. He will be going to all groups and he is asked to stay out of his bed during the day. 7. Disposition is in progress. Medical Decision Making Problem Points:  Established problem, stable/improving (1) Data Points:  Review or order medicine tests (1)  I certify that inpatient services furnished can reasonably be expected to improve the patient's condition.   Verne Spurr 09/07/2013, 11:27 AM Agree with assessment and plan Reymundo Poll. Dub Mikes, M.D.

## 2013-09-07 NOTE — Progress Notes (Signed)
Tried to ask pt what the problem was he started cursing and saying Clinical research associatewriter raised his voice. Then another patient came to the counter cussing at Clinical research associatewriter, Field seismologistcalling writer a "Rude FUCK".

## 2013-09-07 NOTE — Progress Notes (Signed)
D: Patient denies SI/HI and A/V hallucinations; patient reports sleep was poor and reports that he did get about 3 hours of sleep; reports appetite is good; reports energy level is normal ; reports ability to pay attention is improving; rates depression as 4/10; rates hopelessness 3/10; rates anxiety as 10/10 but refuses to take any of those medications because " Im not taking anything they have started me on since I have been here; patient reports that he is agitated after situation on the unit and " Im done with this place and Im ready to leave"  A: Monitored q 15 minutes; patient encouraged to attend groups; patient educated about medications; patient given medications per physician orders; patient encouraged to express feelings and/or concerns  R: Patient has calmed down some after speaking with the providers and the assistant director; patient was willing to stay invested into treatment and take his medications; patient apologized for his actions; patient is cooperative and no longer tearful and shaking and upset; patient's interaction with staff and peers is minimal;patient is taking medications as prescribed and tolerating medications

## 2013-09-07 NOTE — Progress Notes (Signed)
[facility] Group Notes:  (Nursing/MHT/Case Management/Adjunct)  Date:  09/07/2013  Time:  10:10 PM  Type of Therapy:  Psychoeducational Skills  Participation Level:  Active  Participation Quality:  Appropriate  Affect:  Appropriate  Cognitive:  Appropriate  Insight:  Appropriate  Engagement in Group:  Supportive  Modes of Intervention:  Support  Summary of Progress/Problems: Patient appeared in a good mood. He stated that he was feeling a lot better than he had earlier. He stated that overall his day was ok.   Jamas LavJohn W Tenia Goh 09/07/2013, 10:10 PM

## 2013-09-08 MED ORDER — GABAPENTIN 300 MG PO CAPS
600.0000 mg | ORAL_CAPSULE | Freq: Three times a day (TID) | ORAL | Status: DC
  Administered 2013-09-08 – 2013-09-09 (×3): 600 mg via ORAL
  Filled 2013-09-08 (×4): qty 2
  Filled 2013-09-08: qty 84
  Filled 2013-09-08: qty 2
  Filled 2013-09-08: qty 84
  Filled 2013-09-08: qty 2
  Filled 2013-09-08: qty 84
  Filled 2013-09-08: qty 2

## 2013-09-08 NOTE — [facility] Group Notes (Signed)
[facility] LCSW Group Therapy  09/08/2013   1:15 PM   Type of Therapy:  Group Therapy  Participation Level:  Active  Participation Quality:  Attentive, Sharing and Supportive  Affect:  Calm  Cognitive:  Alert and Oriented  Insight:  Developing/Improving and Engaged  Engagement in Therapy:  Developing/Improving and Engaged  Modes of Intervention:  Activity, Clarification, Confrontation, Discussion, Education, Exploration, Limit-setting, Orientation, Problem-solving, Rapport Building, Dance movement psychotherapisteality Testing, Socialization and Support  Summary of Progress/Problems: Patient was attentive and engaged with speaker from [employer].  Patient was attentive to speaker while they shared their story of dealing with mental health and overcoming it.  Patient expressed interest in their programs and services and received information on their agency.  Patient processed ways they can relate to the speaker.   Pt came at the end of group but was attentive to group discussion.    Devin IvanChelsea Horton, LCSW 09/08/2013  2:30 PM

## 2013-09-08 NOTE — Progress Notes (Signed)
Adult Psychoeducational Group Note  Date:  09/07/2013 Time:  10:00am Group Topic/Focus:  Personal Choices and Values:   The focus of this group is to help patients assess and explore the importance of values in their lives, how their values affect their decisions, how they express their values and what opposes their expression.  Participation Level:  Minimal  Participation Quality:  Appropriate and Attentive  Affect:  Appropriate  Cognitive:  Alert and Appropriate  Insight: Limited  Engagement in Group:  Lacking  Modes of Intervention:  Discussion and Education  Additional Comments:   Pt attended and participated in group. Discussion was on what does personal development mean to you? Pt stated self reflecting improving on self.  Pryor Curiaanya D Garner 09/08/2013, 4:40 AM

## 2013-09-08 NOTE — Progress Notes (Signed)
Adult Psychoeducational Group Note  Date:  09/08/2013 Time:  9:00 AM  Group Topic/Focus:  Morning Wellness Group  Participation Level:  Active  Participation Quality:  Appropriate and Attentive  Affect:  Appropriate  Cognitive:  Alert and Oriented  Insight: Appropriate  Engagement in Group:  Engaged  Modes of Intervention:  Discussion  Additional Comments: Patient voiced that a few of his goals are to get a job and volunteer somewhere, like an Furniture conservator/[employer] shelter.  Melanee SpryRonecia E Roran Wegner 09/08/2013, 12:29 PM

## 2013-09-08 NOTE — Progress Notes (Signed)
D: Patient in the hallway on approach.  Patient states he had a better day today.  Patient complained of left leg pain.  Patient states he has been drowsy all day.  Patient states he feels that his medications are starting to work and states his medications are continuing to be adjusted.  Patient states his goal is to get out and look for a job when discharged.  Patient rates his depression 4 and hopelessness 3.  Patient denies SI/HI and denies AVH.   A: Staff to monitor Q 15 mins for safety.  Encouragement and support offered.  Scheduled medications administered per orders.  Flexeril administered prn for muscle spasms. R: Patient remains safe on the unit.  Patient did not attend group tonight.  Patient visible on the unit during the day but did not attend evening group..  Patient taking administered medications.

## 2013-09-08 NOTE — Progress Notes (Signed)
Patient ID: Devin Crawford, male   DOB: 10-Feb-1981, 33 y.o.   MRN: [id number] Patient ID: Devin Crawford, male   DOB: 10-Feb-1981, 33 y.o.   MRN: [id number] [facility] Progress Note  09/08/2013 12:58 PM Devin Crawford  MRN:  [id number] Subjective:  Devin Crawford is doing much better today. He is reporting good sleep and a reduction in pain and anxiety. He is attending groups and is progressing well. He states his pain is significantly reduced by 65%, his depression is now a 3/10, and his anxiety is a 3/10.  He is anticipating discharge home tomorrow. Diagnosis:   DSM5: Schizophrenia Disorders:   Obsessive-Compulsive Disorders:   Trauma-Stressor Disorders:   Substance/Addictive Disorders:  Opiate dependence Depressive Disorders:   Total Time spent with patient:  ADL's:  Intact  Sleep: Poor  Appetite:  Fair  Suicidal Ideation:  denies Homicidal Ideation:  denies AEB (as evidenced by):  Psychiatric Specialty Exam: Physical Exam  ROS  Blood pressure 124/80, pulse 113, temperature 97.6 F (36.4 C), temperature source Oral, resp. rate 18, height 5\' 8"  (1.727 m), weight 88.905 kg (196 lb), SpO2 100.00%.Body mass index is 29.81 kg/(m^2).  General Appearance: Casual  Eye Contact::  Fair  Speech:  Clear and Coherent  Volume:  Normal  Mood:  Depressed  Affect:  Congruent  Thought Process:  Goal Directed  Orientation:  Full (Time, Place, and Person)  Thought Content:  NA  Suicidal Thoughts:  No  Homicidal Thoughts:  No  Memory:  NA  Judgement:  Intact  Insight:  Shallow  Psychomotor Activity:  Normal  Concentration:  Fair  Recall:  Fair  Fund of Knowledge:Good  Language: Good  Akathisia:  No  Handed:  Right  AIMS (if indicated):     Assets:  Communication Skills Desire for Improvement Housing Physical Health Social Support  Sleep:  Number of Hours: 5.25   Musculoskeletal: Strength & Muscle Tone: within normal limits Gait & Station: normal Patient leans: N/A  Current  Medications: Current Facility-Administered Medications  Medication Dose Route Frequency Provider Last Rate Last Dose  . acetaminophen (TYLENOL) tablet 650 mg  650 mg Oral Q6H PRN Sanjuana KavaAgnes I Nwoko, NP   650 mg at 09/07/13 1130  . alum & mag hydroxide-simeth (MAALOX/MYLANTA) 200-200-20 MG/5ML suspension 30 mL  30 mL Oral Q4H PRN Sanjuana KavaAgnes I Nwoko, NP      . buPROPion (WELLBUTRIN XL) 24 hr tablet 150 mg  150 mg Oral Daily Verne SpurrNeil Mashburn, PA-C   150 mg at 09/08/13 [id number]  . busPIRone (BUSPAR) tablet 7.5 mg  7.5 mg Oral BID Verne SpurrNeil Mashburn, PA-C   7.5 mg at 09/08/13 [id number]  . celecoxib (CELEBREX) capsule 200 mg  200 mg Oral BID Verne SpurrNeil Mashburn, PA-C   200 mg at 09/08/13 [id number]  . clonazePAM (KLONOPIN) tablet 1 mg  1 mg Oral BID Verne SpurrNeil Mashburn, PA-C   1 mg at 09/08/13 [id number]  . cloNIDine (CATAPRES) tablet 0.1 mg  0.1 mg Oral QAC breakfast Sanjuana KavaAgnes I Nwoko, NP   0.1 mg at 09/08/13 0806  . cyclobenzaprine (FLEXERIL) tablet 10 mg  10 mg Oral TID PRN Verne SpurrNeil Mashburn, PA-C   10 mg at 09/08/13 0606  . dicyclomine (BENTYL) tablet 20 mg  20 mg Oral Q6H PRN Sanjuana KavaAgnes I Nwoko, NP   20 mg at 09/03/13 1732  . docusate sodium (COLACE) capsule 100 mg  100 mg Oral Daily PRN Verne SpurrNeil Mashburn, PA-C   100 mg at 09/06/13 1506  . DULoxetine (CYMBALTA) DR capsule 60 mg  60 mg Oral Daily Verne SpurrNeil Mashburn, PA-C   60 mg at 09/08/13 [id number]  . elvitegravir-cobicistat-emtricitabine-tenofovir (STRIBILD) 150-150-200-300 MG tablet 1 tablet  1 tablet Oral Q breakfast Verne SpurrNeil Mashburn, PA-C   1 tablet at 09/08/13 [id number]  . gabapentin (NEURONTIN) capsule 400 mg  400 mg Oral TID Verne SpurrNeil Mashburn, PA-C   400 mg at 09/08/13 1158  . gemfibrozil (LOPID) tablet 600 mg  600 mg Oral BID AC Verne SpurrNeil Mashburn, PA-C   600 mg at 09/08/13 0606  . hydrOXYzine (ATARAX/VISTARIL) tablet 25 mg  25 mg Oral Q6H PRN Sanjuana KavaAgnes I Nwoko, NP   25 mg at 09/06/13 [id number]  . hydrOXYzine (ATARAX/VISTARIL) tablet 50 mg  50 mg Oral QHS PRN Verne SpurrNeil Mashburn, PA-C   50 mg at 09/05/13 2311  . lidocaine (LIDODERM) 5 % 1 patch  1  patch Transdermal Q24H Verne SpurrNeil Mashburn, PA-C   1 patch at 09/07/13 1454  . magnesium hydroxide (MILK OF MAGNESIA) suspension 30 mL  30 mL Oral Daily PRN Sanjuana KavaAgnes I Nwoko, NP      . multivitamin with minerals tablet 1 tablet  1 tablet Oral Daily Verne SpurrNeil Mashburn, PA-C   1 tablet at 09/08/13 [id number]  . nicotine (NICODERM CQ - dosed in mg/24 hours) patch 21 mg  21 mg Transdermal Q0600 Sanjuana KavaAgnes I Nwoko, NP      . ondansetron (ZOFRAN-ODT) disintegrating tablet 4 mg  4 mg Oral Q6H PRN Sanjuana KavaAgnes I Nwoko, NP      . pantoprazole (PROTONIX) EC tablet 40 mg  40 mg Oral Q1200 Verne SpurrNeil Mashburn, PA-C   40 mg at 09/08/13 1158  . polycarbophil (FIBERCON) tablet 625 mg  625 mg Oral Daily Verne SpurrNeil Mashburn, PA-C      . QUEtiapine (SEROQUEL) tablet 200 mg  200 mg Oral QHS Verne SpurrNeil Mashburn, PA-C   200 mg at 09/07/13 2133  . ramelteon (ROZEREM) tablet 8 mg  8 mg Oral QHS Kristeen MansFran E Hobson, NP   8 mg at 09/07/13 2133    Lab Results: No results found for this or any previous visit (from the past 48 hour(s)).  Physical Findings: AIMS: Facial and Oral Movements Muscles of Facial Expression: None, normal Lips and Perioral Area: None, normal Jaw: None, normal Tongue: None, normal,Extremity Movements Upper (arms, wrists, hands, fingers): None, normal Lower (legs, knees, ankles, toes): None, normal, Trunk Movements Neck, shoulders, hips: None, normal, Overall Severity Severity of abnormal movements (highest score from questions above): None, normal Incapacitation due to abnormal movements: None, normal Patient's awareness of abnormal movements (rate only patient's report): No Awareness, Dental Status Current problems with teeth and/or dentures?: No Does patient usually wear dentures?: No  CIWA:  CIWA-Ar Total: 1 COWS:  COWS Total Score: 2  Treatment Plan Summary: Daily contact with patient to assess and evaluate symptoms and progress in treatment Medication management  Plan: NEW: 1. Discussed with the patient our concern for his poor  tolerance of minimal adversity and his ability to cope.  Praise for his progress so far in his detox. 2. Patient agreed to stay the day and to finish the detox. 3. Klonopin 1mg  po BID for anxiety is returned to his scheduled meds. 4. Seroquel is d/c'd during the day and increased to 200mg  po at hs for sleep. 5. Celebrex is increased to BID as he has showed no reaction or hives. 6. Increased Neurontin to 600mg  po TID. 7. Disposition in process, anticipate d/c home tomorrow or Friday.  5. For his part he will try to avoid negative thinking pattern. 6.  He will be going to all groups and he is asked to stay out of his bed during the day. 7. Disposition is in progress. 8. Patient will only use Flexeril after trying other options first. 9. Anticipate D/C in AM. Medical Decision Making Problem Points:  Established problem, stable/improving (1) Data Points:  Review or order medicine tests (1)  I certify that inpatient services furnished can reasonably be expected to improve the patient's condition.   Verne Spurr 09/08/2013, 12:58 PM Agree with assessment and plan Madie Reno A. Dub Mikes, M.D.

## 2013-09-08 NOTE — Progress Notes (Signed)
Adult Psychoeducational Group Note  Date:  09/08/2013 Time:  10:00am Group Topic/Focus:  Personal Choices and Values:   The focus of this group is to help patients assess and explore the importance of values in their lives, how their values affect their decisions, how they express their values and what opposes their expression.  Participation Level:  Active  Participation Quality:  Appropriate and Attentive  Affect:  Appropriate  Cognitive:  Alert and Appropriate  Insight: Appropriate  Engagement in Group:  Engaged  Modes of Intervention:  Discussion and Education  Additional Comments:  Pt attended and participated in group. Question was asked what is your biggest stressor? Pt stated worrying about mom and daddy dying.  Pryor Curiaanya D Garner 09/08/2013, 11:44 AM

## 2013-09-08 NOTE — Progress Notes (Signed)
D: Patient appropriate and cooperative with staff and peers. Patient presents with anxious affect and mood. He's actively participating in groups, interacting with peers and visible in the milieu. Adheres to medication regimen.  A: Support and encouragement provided to patient. Administered scheduled medications per ordering MD. Monitor Q15 minute checks for safety.  R: Patient receptive. Denies SI/HI/AVH. Patient remains safe.

## 2013-09-08 NOTE — [facility] Suicide Risk Assessment (Signed)
[facility] INPATIENT:  Family/Significant Other Suicide Prevention Education  Suicide Prevention Education:  Patient Refusal for Family/Significant Other Suicide Prevention Education: The patient Devin Crawford has refused to provide written consent for family/significant other to be provided Family/Significant Other Suicide Prevention Education during admission and/or prior to discharge.  Physician notified.  Patient rescinded consent for collateral contact.   Cory Kitt Hairston Andrews Tener 09/08/2013, 8:50 AM

## 2013-09-09 DIAGNOSIS — F329 Major depressive disorder, single episode, unspecified: Secondary | ICD-10-CM

## 2013-09-09 DIAGNOSIS — F411 Generalized anxiety disorder: Secondary | ICD-10-CM

## 2013-09-09 MED ORDER — QUETIAPINE FUMARATE 200 MG PO TABS: 200.0000 mg | ORAL_TABLET | Freq: Every day | ORAL | Status: DC

## 2013-09-09 MED ORDER — BUPROPION HCL ER (XL) 150 MG PO TB24: 150.0000 mg | ORAL_TABLET | Freq: Every day | ORAL | Status: DC

## 2013-09-09 MED ORDER — BUSPIRONE HCL 7.5 MG PO TABS: 7.5000 mg | ORAL_TABLET | Freq: Two times a day (BID) | ORAL | Status: DC

## 2013-09-09 MED ORDER — GABAPENTIN 300 MG PO CAPS: 600.0000 mg | ORAL_CAPSULE | Freq: Three times a day (TID) | ORAL | Status: DC

## 2013-09-09 MED ORDER — HYDROXYZINE HCL 50 MG PO TABS: 50.0000 mg | ORAL_TABLET | Freq: Every evening | ORAL | Status: DC | PRN

## 2013-09-09 MED ORDER — DULOXETINE HCL 60 MG PO CPEP: 60.0000 mg | ORAL_CAPSULE | Freq: Every day | ORAL | Status: DC

## 2013-09-09 MED ORDER — CELECOXIB 200 MG PO CAPS: 200.0000 mg | ORAL_CAPSULE | Freq: Two times a day (BID) | ORAL | Status: DC

## 2013-09-09 MED ORDER — CYCLOBENZAPRINE HCL 10 MG PO TABS
10.0000 mg | ORAL_TABLET | Freq: Three times a day (TID) | ORAL | Status: DC | PRN
Start: 2013-09-09 — End: 2014-02-09

## 2013-09-09 MED ORDER — LIDOCAINE 5 % EX PTCH: 1.0000 | MEDICATED_PATCH | CUTANEOUS | Status: DC

## 2013-09-09 NOTE — Tx Team (Addendum)
Interdisciplinary Treatment Plan Update   Date Reviewed:  09/09/2013  Time Reviewed:  8:41 AM  Progress in Treatment:   Attending groups: Yes Participating in groups: Yes Taking medication as prescribed: Yes  Tolerating medication: Yes Family/Significant other contact made:  No, patient rescinded consent for collateral contact Patient understands diagnosis: Yes  Discussing patient identified problems/goals with staff: Yes Medical problems stabilized or resolved: Yes Denies suicidal/homicidal ideation: Yes Patient has not harmed self or others: Yes  For review of initial/current patient goals, please see plan of care.  Estimated Length of Stay:  Discharge today  Reasons for Continued Hospitalization:   New Problems/Goals identified:    Discharge Plan or Barriers:   Home [facility]  Additional Comments:  Attendees:  Patient:   09/09/2013 8:41 AM   Signature:  09/09/2013 8:41 AM  Signature:  Verne SpurrNeil Mashburn, PA 09/09/2013 8:41 AM  Signature:   09/09/2013 8:41 AM  Signature:  Quintella ReichertBeverly Knight, RN 09/09/2013 8:41 AM  Signature:   09/09/2013 8:41 AM  Signature:  Juline PatchQuylle Marylynne Keelin, LCSW 09/09/2013 8:41 AM  Signature:  Reyes Ivanhelsea Horton, LCSW 09/09/2013 8:41 AM  Signature:  Leisa LenzValerie Enoch, Care Coordinator [facility] 09/09/2013 8:41 AM  Signature:   09/09/2013 8:41 AM  Signature: 09/09/2013  8:41 AM  Signature:   Onnie BoerJennifer Clark, RN [facility] 09/09/2013  8:41 AM  Signature:  Harold Barbanonecia Byrd, RN 09/09/2013  8:41 AM    Scribe for Treatment Team:   Juline PatchQuylle Shalva Rozycki,  09/09/2013 8:41 AM

## 2013-09-09 NOTE — Progress Notes (Signed)
D:  Patient's self inventory sheet, patient sleeps well, good appetite, normal energy, good attention span.  Rated depression and hopeless 1.  Denied withdrawals.  Denied SI.  Has experienced pain in left leg.  Worst pain #6.  Plans to look for a job/volunteer/exericse.  Does have discharge plans.  No problems taking meds after discharge. A:  Administered medications as ordered by MD.  Emotional support and encouragement given patient. R:  Denied SI and HI.  Denied A/V hallucinations.  Will continue to monitor patient for safety with 15 minute checks.  Safety maintained.

## 2013-09-09 NOTE — Progress Notes (Signed)
Patient ID: Devin NeriBenjamin R Crawford, male   DOB: 03-28-1981, 33 y.o.   MRN: [id number] D: Pt. In bed eyes closed, respirations even. A: Writer observed pt. For s/s of distress. Staff will monitor q2115min for safety. R: Pt. Is safe on the unit, no distress noted, respirations unlabored.

## 2013-09-09 NOTE — [facility] Group Notes (Signed)
[facility] LCSW Group Therapy  Feelings Around Relapse 1:15 -2:30        09/09/2013 2:58 PM   Type of Therapy:  Group Therapy  Participation Level:  Appropriate  Participation Quality:  Appropriate  Affect:  Appropriate  Cognitive:  Attentive Appropriate  Insight:  Developing/Improving  Engagement in Therapy: Developing/Improving  Modes of Intervention:  Discussion Exploration Problem-Solving Supportive  Summary of Progress/Problems:  The topic for today was feelings around relapse.    Patient processed feelings toward relapse and was able to relate to peers. Patient shared he spends time and isolation and begins to fear things he is unable to control.  Patient identified coping skills that can be used to prevent a relapse.   [name], Devin Crawford 09/09/2013 2:58 PM

## 2013-09-09 NOTE — Progress Notes (Signed)
[facility] Discharge [facility] :  Will you be returning to the same living situation after discharge: Yes,  Patient to return to his home. At discharge, do you have transportation home?:Yes,  Patient to return to her home. Do you have the ability to pay for your medications:No.  Patient will be assisted with indigent medications.   Release of information consent forms completed and in the chart;  Patient's signature needed at discharge.  Patient to Follow up at: Follow-up Information   Follow up with Dr. Dorathy DaftMengistu - [facility] On 09/21/2013. (Wednesday, Sep 21, 2013 at 1:40 PM with Dr. June LeapMengistu)    Contact information:   [address] [place], [place]   [id number]  [phone]      Patient denies SI/HI:  Patient no longer endorsing SI/HI or other thoughts of self harm.  Safety Planning and Suicide Prevention discussed: .Reviewed with all patients during discharge planning group   Devin Crawford Devin Crawford 09/09/2013, 9:58 AM

## 2013-09-09 NOTE — Progress Notes (Signed)
Discharge Note:  Patient discharged home.  Denied SI and HI.  Denied pain.  Denied A/V hallucinations.  Suicide prevention information given and discussed with patient who stated he understood and had no questions.  Patient stated he received all his belongings, clothing, toiletries, misc items, prescriptions, and medications.  Patient stated he appreciated all assistance received from [facility].

## 2013-09-09 NOTE — [facility] Group Notes (Signed)
[facility] Dis[facility] Note   09/09/2013 9:57 AM    Participation Quality:  Appropraite  Mood/Affect:  Appropriate  Depression Rating:  2  Anxiety Rating:  4  Thoughts of Suicide:  No  Will you contract for safety?   NA  Current AVH:  No  Plan for Discharge/Comments:  Patient attended discharge planning group and actively participated in group.  He will follow up with [facility]. CSW provided all participants with daily workbook.   Transportation Means: Patient has transportation.   Supports:  Patient has a support system.   Devin Crawford, Joesph JulyQuylle Hairston

## 2013-09-09 NOTE — [facility] Suicide Risk Assessment (Signed)
Suicide Risk Assessment  Discharge Assessment     Demographic Factors:  Male and Caucasian  Total Time spent with patient: 45 minutes  Psychiatric Specialty Exam:     Blood pressure 109/75, pulse 112, temperature 98.5 F (36.9 C), temperature source Oral, resp. rate 18, height 5\' 8"  (1.727 m), weight 88.905 kg (196 lb), SpO2 100.00%.Body mass index is 29.81 kg/(m^2).  General Appearance: Fairly Groomed  Patent attorneyye Contact::  Fair  Speech:  Clear and Coherent  Volume:  Normal  Mood:  Euthymic  Affect:  Appropriate  Thought Process:  Coherent and Goal Directed  Orientation:  Full (Time, Place, and Person)  Thought Content:  plans as he moves on, self care, coping skills  Suicidal Thoughts:  No  Homicidal Thoughts:  No  Memory:  Immediate;   Fair Recent;   Fair Remote;   Fair  Judgement:  Fair  Insight:  Present  Psychomotor Activity:  Normal  Concentration:  Fair  Recall:  FiservFair  Fund of Knowledge:NA  Language: Fair  Akathisia:  No  Handed:    AIMS (if indicated):     Assets:  Desire for Improvement Housing Social Support Transportation  Sleep:  Number of Hours: 6    Musculoskeletal: Strength & Muscle Tone: within normal limits Gait & Station: normal Patient leans: N/A   Mental Status Per Nursing Assessment::   On Admission:     Current Mental Status by Physician: In full contact with reality. There are no active suicidal ideas, plans or intent. His mood is euthymic his affect is bright, broad. He feels ready to be D/C   Loss Factors: NA  Historical Factors: NA  Risk Reduction Factors:   Sense of responsibility to family, Living with another person, especially a relative and Positive social support  Continued Clinical Symptoms:  Depression:   Comorbid alcohol abuse/dependence  Cognitive Features That Contribute To Risk:  Closed-mindedness Polarized thinking Thought constriction (tunnel vision)    Suicide Risk:  Minimal: No identifiable suicidal  ideation.  Patients presenting with no risk factors but with morbid ruminations; may be classified as minimal risk based on the severity of the depressive symptoms  Discharge Diagnoses:   AXIS I:  Opioid Dependence, Major Depression,Generalized Anxiety Disorder AXIS II:  No diagnosis AXIS III:   Past Medical History  Diagnosis Date  . HIV (human immunodeficiency virus infection)   . Blood transfusion 2007  . Arthritis   . Kidney calculus   . Narcotic addiction   . Multiple pelvic fractures   . History of tonsillectomy   . hip fracture left     rod placement 04-2006  . History of spleen injury     removal 2007  . Mental disorder   . Depression    AXIS IV:  other psychosocial or environmental problems AXIS V:  61-70 mild symptoms  Plan Of Care/Follow-up recommendations:  Activity:  as tolerated Diet:  regular Follow up outpatient basis Is patient on multiple antipsychotic therapies at discharge:  No   Has Patient had three or more failed trials of antipsychotic monotherapy by history:  No  Recommended Plan for Multiple Antipsychotic Therapies: NA    Rachael FeeIrving A Denesha Brouse 09/09/2013, 12:19 PM

## 2013-09-11 NOTE — Discharge Summary (Signed)
Physician Discharge Summary Note  Patient:  Devin Crawford is an 33 y.o., male MRN:  [id number] DOB:  07-10-1980 Patient phone:  [phone] (home)  Patient address:   [address] [place] [address],  Total Time spent with patient: 30 minutes  Date of Admission:  09/03/2013 Date of Discharge:  09/09/2013  Reason for Admission:  Opiate detox   Discharge Diagnoses: Active Problems:   Opioid dependence   Psychiatric Specialty Exam:   Please See D/C SRA Physical Exam  ROS  Blood pressure 120/79, pulse 101, temperature 98.5 F (36.9 C), temperature source Oral, resp. rate 18, height 5\' 8"  (1.727 m), weight 88.905 kg (196 lb), SpO2 100.00%.Body mass index is 29.81 kg/(m^2).  General Appearance:   Eye Contact::    Speech:    Volume:    Mood:    Affect:    Thought Process:    Orientation:    Thought Content:    Suicidal Thoughts:    Homicidal Thoughts:    Memory:    Judgement:    Insight:    Psychomotor Activity:    Concentration:    Recall:    Fund of Knowledge:  Language:   Akathisia:    Handed:    AIMS (if indicated):     Assets:    Sleep:  Number of Hours: 6    Past Psychiatric History: Diagnosis:  Hospitalizations:  Outpatient Care:  Substance Abuse Care:  Self-Mutilation:  Suicidal Attempts:  Violent Behaviors:   Musculoskeletal: Strength & Muscle Tone:  Gait & Station:  Patient leans:   DSM5:  Schizophrenia Disorders:   Obsessive-Compulsive Disorders:   Trauma-Stressor Disorders:   Substance/Addictive Disorders:   Depressive Disorders:    Axis Diagnosis:  Discharge Diagnoses:  AXIS I: Opioid Dependence, Major Depression,Generalized Anxiety Disorder  AXIS II: No diagnosis  AXIS III:  Past Medical History   Diagnosis  Date   .  HIV (human immunodeficiency virus infection)    .  Blood transfusion  2007   .  Arthritis    .  Kidney calculus    .  Narcotic addiction    .  Multiple pelvic fractures    .  History of  tonsillectomy    .  hip fracture left      rod placement 04-2006   .  History of spleen injury      removal 2007   .  Mental disorder    .  Depression     AXIS IV: other psychosocial or environmental problems  AXIS V: 61-70 mild symptoms  Level of Care:  OP  Hospital Course:  Devin Crawford was admitted from the ED at [facility] for the 3rd admission this year. He was requesting detox from his opiates which he had been abusing for a year and a half.        Devin Crawford was accepted to [facility] and the Clonidine protocol was initiated. He was evaluated and his symptoms were documented. He was encouraged to participate in unit programming.      Devin Crawford tended to isolate to his room and to avoid groups stating that his pain was severe without his fentanyl patches which he had been abusing.   He was treated with Celebrex 200mg  and increased to BID when he had no adverse reaction.        He had difficulty coping with the smallest negative impact and had little to no tolerance for delayed gratification. His Klonopin was restarted and he was encouraged to  use his coping skills that he has learned in groups. He was also encouraged to stay out of his room and to return "to the living" world.  Once his Klonopin had been restarted he felt better and was able to cope enough to engage in groups.         His Gabapentin was increased incramentally to help with the neuropathic pain.  Devin Crawford noticed a good reduction in symptoms of pain, anxiety and panic. By the day of discharge he noted that his pain was 65% better and he had no withdrawal symptoms. He had taken the initiative to contact his PCP to inform him personally of his abuse of the Fentanyl and that he no longer wanted any narcotics. His actions were greatly supported and applauded by this provider.        Devin Crawford was also encouraged to take positive steps to change his current status of couch potato. 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  He agreed to pursue this.        By the day of discharge Devin Crawford was in much better shape and ready to embark on his new sobriety and life plan.  He thanked this provider for his care and apologized for being difficult. He denied SI/HI or AVH. He had no withdrawal symptoms.        He had a follow up appointment scheduled with his PCP and would follow up at [facility] as noted below.  Consults:  None  Significant Diagnostic Studies:  None  Discharge Vitals:   Blood pressure 120/79, pulse 101, temperature 98.5 F (36.9 C), temperature source Oral, resp. rate 18, height 5\' 8"  (1.727 m), weight 88.905 kg (196 lb), SpO2 100.00%. Body mass index is 29.81 kg/(m^2). Lab Results:   No results found for this or any previous visit (from the past 72 hour(s)).  Physical Findings: AIMS: Facial and Oral Movements Muscles of Facial Expression: None, normal Lips and Perioral Area: None, normal Jaw: None, normal Tongue: None, normal,Extremity Movements Upper (arms, wrists, hands, fingers): None, normal Lower (legs, knees, ankles, toes): None, normal, Trunk Movements Neck, shoulders, hips: None, normal, Overall Severity Severity of abnormal movements (highest score from questions above): None, normal Incapacitation due to abnormal movements: None, normal Patient's awareness of abnormal movements (rate only patient's report): No Awareness, Dental Status Current problems with teeth and/or dentures?: No Does patient usually wear dentures?: No  CIWA:  CIWA-Ar Total: 1 COWS:  COWS Total Score: 3  Psychiatric Specialty Exam: See Psychiatric Specialty Exam and Suicide Risk Assessment completed by Attending Physician prior to discharge.  Discharge destination:  Home  Is patient on multiple antipsychotic therapies at discharge:  No   Has Patient had three or more failed trials of antipsychotic monotherapy by history:  No  Recommended  Plan for Multiple Antipsychotic Therapies: NA  Discharge Orders   Future Appointments Provider Department Dept Phone   09/15/2013 11:00 AM Judyann Munson, MD [facility] [phone]   Future Orders Complete By Expires   Diet - low sodium heart healthy  As directed    Discharge instructions  As directed    Increase activity slowly  As directed        Medication List    STOP taking these medications       fentaNYL 25 MCG/HR patch  Commonly known as:  DURAGESIC -  dosed mcg/hr     multivitamin with minerals Tabs tablet     naproxen sodium 220 MG tablet  Commonly known as:  ANAPROX      TAKE these medications     Indication   buPROPion 150 MG 24 hr tablet  Commonly known as:  WELLBUTRIN XL  Take 1 tablet (150 mg total) by mouth daily. For depression.   Indication:  Major Depressive Disorder, mood stabilization     busPIRone 7.5 MG tablet  Commonly known as:  BUSPAR  Take 1 tablet (7.5 mg total) by mouth 2 (two) times daily.   Indication:  Symptoms of Feeling Anxious     celecoxib 200 MG capsule  Commonly known as:  CELEBREX  Take 1 capsule (200 mg total) by mouth 2 (two) times daily.   Indication:  Joint Damage causing Pain and Loss of Function     clonazePAM 1 MG tablet  Commonly known as:  KLONOPIN  Take 1 tablet (1 mg total) by mouth 2 (two) times daily.      cyclobenzaprine 10 MG tablet  Commonly known as:  FLEXERIL  Take 1 tablet (10 mg total) by mouth 3 (three) times daily as needed for muscle spasms.   Indication:  Muscle Spasm     DULoxetine 60 MG capsule  Commonly known as:  CYMBALTA  Take 1 capsule (60 mg total) by mouth daily.   Indication:  Major Depressive Disorder     elvitegravir-cobicistat-emtricitabine-tenofovir 150-150-200-300 MG Tabs tablet  Commonly known as:  STRIBILD  Take 1 tablet by mouth daily with breakfast. For HIV   Indication:  HIV Disease     gabapentin 300 MG capsule  Commonly known as:   NEURONTIN  Take 2 capsules (600 mg total) by mouth 3 (three) times daily.   Indication:  Neuropathic Pain, Neuropathic pain     gemfibrozil 600 MG tablet  Commonly known as:  LOPID  Take 1 tablet (600 mg total) by mouth 2 (two) times daily before a meal. For cholesterol and lipids.   Indication:  Type IV Hyperlipidemia     hydrOXYzine 50 MG tablet  Commonly known as:  ATARAX/VISTARIL  Take 1 tablet (50 mg total) by mouth at bedtime as needed (sleep).   Indication:  Anxiety Neurosis, Sedation, Tension     lidocaine 5 %  Commonly known as:  LIDODERM  Place 1 patch onto the skin daily. Remove & Discard patch within 12 hours or as directed by MD   Indication:  Pain     omeprazole 40 MG capsule  Commonly known as:  PRILOSEC  Take 1 capsule (40 mg total) by mouth daily. For GERD.   Indication:  Gastroesophageal Reflux Disease with Current Symptoms     QUEtiapine 200 MG tablet  Commonly known as:  SEROQUEL  Take 1 tablet (200 mg total) by mouth at bedtime.   Indication:  Depressive Phase of Manic-Depression     ramelteon 8 MG tablet  Commonly known as:  ROZEREM  Take 1 tablet (8 mg total) by mouth at bedtime. For insomnia.   Indication:  Trouble Sleeping           Follow-up Information   Follow up with Dr. Dorathy DaftMengistu - [facility] On 09/21/2013. (Wednesday, Sep 21, 2013 at 1:40 PM with Dr. June LeapMengistu)    Contact information:   [address] [place], [place]   [id number]  [phone]      Follow up with [facility] On 09/12/2013. (CSW will call patient at  home [phone]) with appointment.)    Contact information:   [address] [place], [place]   [address]  [phone]      Follow-up recommendations:   Activities: Resume activity as tolerated. Diet: Heart healthy low sodium diet Tests: Follow up testing will be determined by your out patient provider. Comments:    Total Discharge Time:  Greater than 30 minutes.  Signed: Verne Spurreil Mashburn 09/11/2013, 3:13  PM Personally evaluated the patient and agree with assessment and plan Madie RenoIrving A. Dub MikesLugo, M.D.

## 2013-09-13 NOTE — Progress Notes (Signed)
Patient Discharge Instructions:  After Visit Summary (AVS):   Faxed to:  09/13/13 Discharge Summary Note:   Faxed to:  09/13/13 Psychiatric Admission Assessment Note:   Faxed to:  09/13/13 Suicide Risk Assessment - Discharge Assessment:   Faxed to:  09/13/13 Faxed/Sent to the Next Level Care provider:  09/13/13 Faxed to [facility] @ [phone] [facility] to [facility] @ [phone]  Jerelene ReddenSheena E , 09/13/2013, 4:04 PM

## 2013-09-14 ENCOUNTER — Other Ambulatory Visit (HOSPITAL_COMMUNITY): Payer: Self-pay | Admitting: Physician Assistant

## 2013-09-15 ENCOUNTER — Encounter: Payer: Self-pay | Admitting: Internal Medicine

## 2013-09-15 ENCOUNTER — Ambulatory Visit: Payer: Self-pay | Admitting: Internal Medicine

## 2013-10-09 ENCOUNTER — Other Ambulatory Visit (HOSPITAL_COMMUNITY): Payer: Self-pay | Admitting: Physician Assistant

## 2013-10-12 ENCOUNTER — Other Ambulatory Visit: Payer: Self-pay | Admitting: Internal Medicine

## 2013-11-04 ENCOUNTER — Ambulatory Visit: Payer: Self-pay | Admitting: Infectious Diseases

## 2013-11-06 ENCOUNTER — Other Ambulatory Visit: Payer: Self-pay | Admitting: Internal Medicine

## 2013-11-15 ENCOUNTER — Other Ambulatory Visit (INDEPENDENT_AMBULATORY_CARE_PROVIDER_SITE_OTHER): Payer: Self-pay

## 2013-11-15 DIAGNOSIS — B20 Human immunodeficiency virus [HIV] disease: Secondary | ICD-10-CM

## 2013-11-15 LAB — COMPLETE METABOLIC PANEL WITH GFR
ALT: 29 U/L (ref 0–53)
AST: 19 U/L (ref 0–37)
Albumin: 4.3 g/dL (ref 3.5–5.2)
Alkaline Phosphatase: 119 U/L — ABNORMAL HIGH (ref 39–117)
BUN: 8 mg/dL (ref 6–23)
CALCIUM: 9.3 mg/dL (ref 8.4–10.5)
CHLORIDE: 103 meq/L (ref 96–112)
CO2: 27 meq/L (ref 19–32)
Creat: 1.16 mg/dL (ref 0.50–1.35)
GFR, EST NON AFRICAN AMERICAN: 84 mL/min
GFR, Est African American: >89 mL/min
Glucose, Bld: 93 mg/dL (ref 70–99)
Potassium: 4.2 mEq/L (ref 3.5–5.3)
Sodium: 140 mEq/L (ref 135–145)
Total Bilirubin: 0.2 mg/dL (ref 0.2–1.2)
Total Protein: 6.8 g/dL (ref 6.0–8.3)

## 2013-11-16 LAB — CBC WITH DIFFERENTIAL/PLATELET
BASOS ABS: 0 10*3/uL (ref 0.0–0.1)
Basophils Relative: 0 % (ref 0–1)
Eosinophils Absolute: 0.3 10*3/uL (ref 0.0–0.7)
Eosinophils Relative: 2 % (ref 0–5)
HEMATOCRIT: 41.3 % (ref 39.0–52.0)
HEMOGLOBIN: 14.6 g/dL (ref 13.0–17.0)
LYMPHS ABS: 5.8 10*3/uL — AB (ref 0.7–4.0)
LYMPHS PCT: 44 % (ref 12–46)
MCH: 32.4 pg (ref 26.0–34.0)
MCHC: 35.4 g/dL (ref 30.0–36.0)
MCV: 91.6 fL (ref 78.0–100.0)
MONO ABS: 0.7 10*3/uL (ref 0.1–1.0)
MONOS PCT: 5 % (ref 3–12)
Neutro Abs: 6.4 10*3/uL (ref 1.7–7.7)
Neutrophils Relative %: 49 % (ref 43–77)
Platelets: 473 10*3/uL — ABNORMAL HIGH (ref 150–400)
RBC: 4.51 MIL/uL (ref 4.22–5.81)
RDW: 14.3 % (ref 11.5–15.5)
WBC: 13.1 10*3/uL — AB (ref 4.0–10.5)

## 2013-11-16 LAB — T-HELPER CELL (CD4) - (RCID CLINIC ONLY)
CD4 T CELL ABS: 1150 /uL (ref 400–2700)
CD4 T CELL HELPER: 20 % — AB (ref 33–55)

## 2013-11-18 LAB — HIV-1 RNA QUANT-NO REFLEX-BLD: HIV-1 RNA Quant, Log: <1.3 {Log} (ref <1.30)

## 2013-11-25 ENCOUNTER — Emergency Department (HOSPITAL_COMMUNITY)
Admission: EM | Admit: 2013-11-25 | Discharge: 2013-11-25 | Disposition: A | Discharge status: Home / Self Care | Payer: Self-pay | Attending: Emergency Medicine | Admitting: Emergency Medicine

## 2013-11-25 ENCOUNTER — Emergency Department (HOSPITAL_COMMUNITY): Payer: Self-pay

## 2013-11-25 ENCOUNTER — Encounter (HOSPITAL_COMMUNITY): Payer: Self-pay | Admitting: Emergency Medicine

## 2013-11-25 DIAGNOSIS — T43601A Poisoning by unspecified psychostimulants, accidental (unintentional), initial encounter: Secondary | ICD-10-CM | POA: Insufficient documentation

## 2013-11-25 DIAGNOSIS — Z8781 Personal history of (healed) traumatic fracture: Secondary | ICD-10-CM | POA: Insufficient documentation

## 2013-11-25 DIAGNOSIS — M129 Arthropathy, unspecified: Secondary | ICD-10-CM | POA: Insufficient documentation

## 2013-11-25 DIAGNOSIS — T50905A Adverse effect of unspecified drugs, medicaments and biological substances, initial encounter: Secondary | ICD-10-CM

## 2013-11-25 DIAGNOSIS — Y929 Unspecified place or not applicable: Secondary | ICD-10-CM | POA: Insufficient documentation

## 2013-11-25 DIAGNOSIS — R519 Headache, unspecified: Secondary | ICD-10-CM

## 2013-11-25 DIAGNOSIS — F329 Major depressive disorder, single episode, unspecified: Secondary | ICD-10-CM | POA: Insufficient documentation

## 2013-11-25 DIAGNOSIS — B20 Human immunodeficiency virus [HIV] disease: Secondary | ICD-10-CM

## 2013-11-25 DIAGNOSIS — Y9389 Activity, other specified: Secondary | ICD-10-CM | POA: Insufficient documentation

## 2013-11-25 DIAGNOSIS — R51 Headache: Secondary | ICD-10-CM | POA: Insufficient documentation

## 2013-11-25 DIAGNOSIS — F1410 Cocaine abuse, uncomplicated: Secondary | ICD-10-CM

## 2013-11-25 DIAGNOSIS — Z21 Asymptomatic human immunodeficiency virus [HIV] infection status: Secondary | ICD-10-CM | POA: Insufficient documentation

## 2013-11-25 DIAGNOSIS — Z880 Allergy status to penicillin: Secondary | ICD-10-CM | POA: Insufficient documentation

## 2013-11-25 DIAGNOSIS — Z791 Long term (current) use of non-steroidal anti-inflammatories (NSAID): Secondary | ICD-10-CM | POA: Insufficient documentation

## 2013-11-25 DIAGNOSIS — Z87442 Personal history of urinary calculi: Secondary | ICD-10-CM | POA: Insufficient documentation

## 2013-11-25 DIAGNOSIS — Z87891 Personal history of nicotine dependence: Secondary | ICD-10-CM | POA: Insufficient documentation

## 2013-11-25 DIAGNOSIS — R569 Unspecified convulsions: Secondary | ICD-10-CM | POA: Insufficient documentation

## 2013-11-25 DIAGNOSIS — T405X1A Poisoning by cocaine, accidental (unintentional), initial encounter: Secondary | ICD-10-CM | POA: Insufficient documentation

## 2013-11-25 DIAGNOSIS — Z79899 Other long term (current) drug therapy: Secondary | ICD-10-CM | POA: Insufficient documentation

## 2013-11-25 DIAGNOSIS — Z87828 Personal history of other (healed) physical injury and trauma: Secondary | ICD-10-CM | POA: Insufficient documentation

## 2013-11-25 DIAGNOSIS — F3289 Other specified depressive episodes: Secondary | ICD-10-CM | POA: Insufficient documentation

## 2013-11-25 LAB — RAPID URINE DRUG SCREEN, HOSP PERFORMED
AMPHETAMINES: NOT DETECTED
Barbiturates: NOT DETECTED
Benzodiazepines: NOT DETECTED
Cocaine: POSITIVE — AB
Opiates: NOT DETECTED
Tetrahydrocannabinol: NOT DETECTED

## 2013-11-25 LAB — I-STAT TROPONIN, ED: Troponin i, poc: 0 ng/mL (ref 0.00–0.08)

## 2013-11-25 LAB — BASIC METABOLIC PANEL
Anion gap: 17 — ABNORMAL HIGH (ref 5–15)
BUN: 12 mg/dL (ref 6–23)
CALCIUM: 9 mg/dL (ref 8.4–10.5)
CO2: 21 mEq/L (ref 19–32)
Chloride: 105 mEq/L (ref 96–112)
Creatinine, Ser: 1.23 mg/dL (ref 0.50–1.35)
GFR calc Af Amer: 90 mL/min — ABNORMAL LOW (ref >90)
GFR, EST NON AFRICAN AMERICAN: 78 mL/min — AB (ref >90)
GLUCOSE: 98 mg/dL (ref 70–99)
Potassium: 4 mEq/L (ref 3.7–5.3)
Sodium: 143 mEq/L (ref 137–147)

## 2013-11-25 LAB — CBC
HCT: 39.9 % (ref 39.0–52.0)
Hemoglobin: 13.6 g/dL (ref 13.0–17.0)
MCH: 32.9 pg (ref 26.0–34.0)
MCHC: 34.1 g/dL (ref 30.0–36.0)
MCV: 96.4 fL (ref 78.0–100.0)
PLATELETS: 362 10*3/uL (ref 150–400)
RBC: 4.14 MIL/uL — AB (ref 4.22–5.81)
RDW: 14.8 % (ref 11.5–15.5)
WBC: 16.2 10*3/uL — ABNORMAL HIGH (ref 4.0–10.5)

## 2013-11-25 MED ORDER — SODIUM CHLORIDE 0.9 % IV BOLUS (SEPSIS)
1000.0000 mL | Freq: Once | INTRAVENOUS | Status: AC
  Administered 2013-11-25: 1000 mL via INTRAVENOUS

## 2013-11-25 MED ORDER — GADOBENATE DIMEGLUMINE 529 MG/ML IV SOLN
20.0000 mL | Freq: Once | INTRAVENOUS | Status: AC
  Administered 2013-11-25: 20 mL via INTRAVENOUS

## 2013-11-25 MED ORDER — LORAZEPAM 2 MG/ML IJ SOLN
1.0000 mg | Freq: Once | INTRAMUSCULAR | Status: AC
  Administered 2013-11-25: 1 mg via INTRAVENOUS
  Filled 2013-11-25: qty 1

## 2013-11-25 MED ORDER — SODIUM CHLORIDE 0.9 % IV BOLUS (SEPSIS)
2000.0000 mL | Freq: Once | INTRAVENOUS | Status: AC
  Administered 2013-11-25: 2000 mL via INTRAVENOUS

## 2013-11-25 NOTE — ED Provider Notes (Signed)
The patient signed out to me by Doctor Lavella Lemons to followup on MRI. Patient had been seen overnight for loss of consciousness after using cocaine. Patient had had a CT scan which showed an abnormality, MRI was obtained to further evaluate. The hyperdense area turns out to be a simple calcium deposit, no acute abnormality.  She was reevaluated. He is doing well. He is awake alert and oriented without complaints. Patient is therefore appropriate for discharge, followup with his doctor.  Results for orders placed during the hospital encounter of 11/25/13  BASIC METABOLIC PANEL      Result Value Ref Range   Sodium 143  137 - 147 mEq/L   Potassium 4.0  3.7 - 5.3 mEq/L   Chloride 105  96 - 112 mEq/L   CO2 21  19 - 32 mEq/L   Glucose, Bld 98  70 - 99 mg/dL   BUN 12  6 - 23 mg/dL   Creatinine, Ser 5.78  0.50 - 1.35 mg/dL   Calcium 9.0  8.4 - 46.9 mg/dL   GFR calc non Af Amer 78 (*) >90 mL/min   GFR calc Af Amer 90 (*) >90 mL/min   Anion gap 17 (*) 5 - 15  CBC      Result Value Ref Range   WBC 16.2 (*) 4.0 - 10.5 K/uL   RBC 4.14 (*) 4.22 - 5.81 MIL/uL   Hemoglobin 13.6  13.0 - 17.0 g/dL   HCT 62.9  52.8 - 41.3 %   MCV 96.4  78.0 - 100.0 fL   MCH 32.9  26.0 - 34.0 pg   MCHC 34.1  30.0 - 36.0 g/dL   RDW 24.4  01.0 - 27.2 %   Platelets 362  150 - 400 K/uL  URINE RAPID DRUG SCREEN (HOSP PERFORMED)      Result Value Ref Range   Opiates NONE DETECTED  NONE DETECTED   Cocaine POSITIVE (*) NONE DETECTED   Benzodiazepines NONE DETECTED  NONE DETECTED   Amphetamines NONE DETECTED  NONE DETECTED   Tetrahydrocannabinol NONE DETECTED  NONE DETECTED   Barbiturates NONE DETECTED  NONE DETECTED  I-STAT TROPOININ, ED      Result Value Ref Range   Troponin i, poc 0.00  0.00 - 0.08 ng/mL   Comment 3            Ct Head Wo Contrast  11/25/2013   CLINICAL DATA:  Syncope, new onset seizure.  EXAM: CT HEAD WITHOUT CONTRAST  TECHNIQUE: Contiguous axial images were obtained from the base of the skull through  the vertex without intravenous contrast.  COMPARISON:  None.  FINDINGS: Mild to moderate ventriculomegaly, likely on the basis of global parenchymal brain volume loss as there is overall commensurate enlargement of cerebral sulci and cerebellar folia, advanced for age. No intraparenchymal hemorrhage, mass effect nor midline shift. No acute large vascular territory infarcts. Left inferior basal ganglia presumed perivascular space.  No abnormal extra-axial fluid collections ; 4 mm high left frontal extra-axial density, 27/32. Basal cisterns are patent.  No skull fracture. The included ocular globes and orbital contents are non-suspicious. Right maxillary mucosal retention cyst without paranasal sinus air-fluid levels.  IMPRESSION: 4 mm high left frontal extra-axial density, is unclear this could reflect a cortical vein, mineralization, less likely blood products though if clinically indicated, follow-up CT of the head to be performed in 6 hr.  Mild-to-moderate global brain volume loss, advanced for age.   Electronically Signed   By: Awilda Metro  On: 11/25/2013 06:02   Mr Laqueta JeanBrain W ZOWo Contrast  11/25/2013   CLINICAL DATA:  Drug abuse.  Seizure.  Headache.  HIV infection.  EXAM: MRI HEAD WITHOUT AND WITH CONTRAST  TECHNIQUE: Multiplanar, multiecho pulse sequences of the brain and surrounding structures were obtained without and with intravenous contrast.  CONTRAST:  20mL MULTIHANCE GADOBENATE DIMEGLUMINE 529 MG/ML IV SOLN  COMPARISON:  Head CT same day  FINDINGS: Diffusion imaging does not show any acute or subacute infarction. No focal abnormality is seen affecting the brainstem or cerebellum. Within the cerebral hemispheres, there is old infarction in the left basal ganglia/external capsule region. There is old infarction within the left caudate nucleus. There is no evidence of neoplastic disease. No evidence of inflammatory disease of the brain or leptomeninges. There is no evidence of acute hemorrhage. There  are a few small foci of hemosiderin deposition in the left hemisphere related to old ischemic insults. No hydrocephalus or extra-axial collection.  The tiny density along the left frontal vertex described as CT represents a small calcification projecting inward from the inner table which could represent a 4 mm calcified meningioma. This is not significant.  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  IMPRESSION: No acute finding.  No finding specific for HIV infection.  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 Tiny foci of hemosiderin deposition associated with some of these insults. No acute hemorrhage.  Tiny density seen at CT at the left frontal vertex relates to a 4 mm calcification projecting from the inner table of the skull, probably a tiny calcified meningioma, not significant.   Electronically Signed   By: Paulina FusiMark  Shogry M.D.   On: 11/25/2013 10:06    Filed Vitals:   11/25/13 1007  BP: 125/73  Pulse: 101  Temp:   Resp: 16     Gilda Creasehristopher J. Pollina, MD 11/25/13 1014

## 2013-11-25 NOTE — Discharge Planning (Signed)
[facility] Community Liaison was not able to see patient, [facility] orange card information and resource guide will be sent to the address provided. °

## 2013-11-25 NOTE — ED Notes (Signed)
Pt still in MRI 

## 2013-11-25 NOTE — ED Notes (Signed)
Per EMS, pt did some cocaine earlier this evening when he started to have chest pain. The pt's partner reports that the pt lost consciousness and seized for 1-2 minutes. Pt states that he has no recollection of having chest pain or seizing. Pt states that he did about 1 gram of cocaine around 0030. Pt is alert and oriented at this time and NAD.

## 2013-11-25 NOTE — ED Notes (Signed)
Gave pt water 

## 2013-11-25 NOTE — ED Notes (Signed)
Patient states this is not the first time he has done cocaine but the first time he had a reaction like this. He is diaphoretic, tachycardic at this time. A/O.
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
## 2013-11-29 ENCOUNTER — Encounter: Payer: Self-pay | Admitting: Internal Medicine

## 2013-11-29 ENCOUNTER — Ambulatory Visit (INDEPENDENT_AMBULATORY_CARE_PROVIDER_SITE_OTHER): Payer: Self-pay | Admitting: Internal Medicine

## 2013-11-29 VITALS — BP 137/100 | HR 115 | Temp 98.8°F | Ht 68.5 in | Wt 216.0 lb

## 2013-11-29 DIAGNOSIS — F11221 Opioid dependence with intoxication delirium: Secondary | ICD-10-CM

## 2013-11-29 DIAGNOSIS — F411 Generalized anxiety disorder: Secondary | ICD-10-CM

## 2013-11-29 DIAGNOSIS — B20 Human immunodeficiency virus [HIV] disease: Secondary | ICD-10-CM

## 2013-11-29 DIAGNOSIS — F1120 Opioid dependence, uncomplicated: Secondary | ICD-10-CM

## 2013-11-29 DIAGNOSIS — F19921 Other psychoactive substance use, unspecified with intoxication with delirium: Secondary | ICD-10-CM

## 2013-11-29 NOTE — Progress Notes (Signed)
Subjective:    Patient ID: Devin Crawford, male    DOB: [birth year], 33 y.o.   MRN: [id number]  HPI 32yo M with HIV disease with cd 4 count 1130/VL<20, doing well with HIV disease. Since last seen in clinic he did detox at behavioral health to decrease addiction to opioids. Did have cocaine induced seizure last week requiring ED visit. He reports his anxiety intense and would like refill on klonopin.  Current Outpatient Prescriptions on File Prior to Visit  Medication Sig Dispense Refill  . acetaminophen (TYLENOL) 325 MG tablet Take 650 mg by mouth every 6 (six) hours as needed for mild pain.      [name]. buPROPion (WELLBUTRIN XL) 150 MG 24 hr tablet Take 1 tablet (150 mg total) by mouth daily. For depression.  30 tablet  0  . cyclobenzaprine (FLEXERIL) 10 MG tablet Take 1 tablet (10 mg total) by mouth 3 (three) times daily as needed for muscle spasms.  30 tablet  0  . DULoxetine (CYMBALTA) 60 MG capsule Take 1 capsule (60 mg total) by mouth daily.  30 capsule  0  . elvitegravir-cobicistat-emtricitabine-tenofovir (STRIBILD) 150-150-200-300 MG TABS tablet Take 1 tablet by mouth daily with breakfast. For HIV  30 tablet  11  . gabapentin (NEURONTIN) 300 MG capsule Take 2 capsules (600 mg total) by mouth 3 (three) times daily.  180 capsule  0  . gemfibrozil (LOPID) 600 MG tablet Take 600 mg by mouth 2 (two) times daily before a meal.      . Multiple Vitamins-Minerals (MULTIVITAMIN WITH MINERALS) tablet Take 1 tablet by mouth daily.      [name]. omeprazole (PRILOSEC) 40 MG capsule Take 40 mg by mouth daily.      . QUEtiapine (SEROQUEL) 200 MG tablet Take 1 tablet (200 mg total) by mouth at bedtime.  30 tablet  0  . celecoxib (CELEBREX) 200 MG capsule Take 1 capsule (200 mg total) by mouth 2 (two) times daily.  60 capsule  0   No current facility-administered medications on file prior to visit.   Active Ambulatory Problems    Diagnosis Date Noted  . Human immunodeficiency virus (HIV) disease 02/09/2012  .  Restless leg syndrome 02/09/2012  . Anxiety 02/09/2012  . Monocular diplopia 02/14/2012  . H/O herpes zoster 02/14/2012  . Bursitis of hip 02/14/2012  . Severe recurrent major depression without psychotic features 05/28/2013  . Dyslipidemia 05/28/2013  . Hypertriglyceridemia 05/28/2013  . Polysubstance (including opioids) dependence without physiological dependence 05/28/2013  . Chronic pain syndrome 05/28/2013  . MDD (major depressive disorder), recurrent severe, without psychosis 08/06/2013  . Opioid dependence 09/03/2013   Resolved Ambulatory Problems    Diagnosis Date Noted  . Suicidal ideation 06/05/2011  . Depression, major 02/09/2012   Past Medical History  Diagnosis Date  . HIV (human immunodeficiency virus infection)   . Blood transfusion 2007  . Arthritis   . Kidney calculus   . Narcotic addiction   . Multiple pelvic fractures   . History of tonsillectomy   . hip fracture left   . History of spleen injury   . Mental disorder   . Depression         Review of Systems + anxiety    Objective:   Physical Exam BP 137/100  Pulse 115  Temp(Src) 98.8 F (37.1 C) (Oral)  Ht 5' 8.5" (1.74 m)  Wt 216 lb (97.977 kg)  BMI 32.36 kg/m2 Physical Exam  Constitutional: He is oriented to person, place, and time.  He appears well-developed and well-nourished. No distress.  HENT:  Mouth/Throat: Oropharynx is clear and moist. No oropharyngeal exudate.  Lymphadenopathy:  no cervical adenopathy.  Neurological: He is alert and oriented to person, place, and time.  Skin: Skin is warm and dry. No rash noted. No erythema.  Psychiatric: tearful, anxious        Assessment & Plan:  hiv = well controlled, continue on stribild  Polysubstance abuse= introduced him to jodie to start counseling relationship  Anxiety/depression = refer back to [facility]. Did not refill his klonopin  rtc in 4 wk

## 2013-12-23 ENCOUNTER — Other Ambulatory Visit: Payer: Self-pay | Admitting: Internal Medicine

## 2014-01-09 ENCOUNTER — Ambulatory Visit: Payer: Self-pay | Admitting: Internal Medicine

## 2014-01-12 ENCOUNTER — Other Ambulatory Visit: Payer: Self-pay

## 2014-01-12 ENCOUNTER — Ambulatory Visit: Payer: Self-pay

## 2014-01-18 ENCOUNTER — Other Ambulatory Visit: Payer: Self-pay | Admitting: Internal Medicine

## 2014-01-20 ENCOUNTER — Other Ambulatory Visit: Payer: Self-pay | Admitting: *Deleted

## 2014-01-20 DIAGNOSIS — B20 Human immunodeficiency virus [HIV] disease: Secondary | ICD-10-CM

## 2014-01-20 MED ORDER — ELVITEG-COBIC-EMTRICIT-TENOFDF 150-150-200-300 MG PO TABS: 1.0000 | ORAL_TABLET | Freq: Every day | ORAL | Status: DC

## 2014-01-30 ENCOUNTER — Encounter: Payer: Self-pay | Admitting: Internal Medicine

## 2014-01-30 ENCOUNTER — Ambulatory Visit (INDEPENDENT_AMBULATORY_CARE_PROVIDER_SITE_OTHER): Payer: Self-pay | Admitting: Internal Medicine

## 2014-01-30 VITALS — BP 125/78 | HR 87 | Temp 97.7°F | Ht 68.0 in | Wt 229.0 lb

## 2014-01-30 DIAGNOSIS — Z23 Encounter for immunization: Secondary | ICD-10-CM

## 2014-01-30 DIAGNOSIS — R635 Abnormal weight gain: Secondary | ICD-10-CM

## 2014-01-30 NOTE — Progress Notes (Signed)
Patient ID: Devin Crawford, male   DOB: 1980/08/04, 33 y.o.   MRN: [id number]       Patient ID: Devin Crawford, male   DOB: [birth year], 33 y.o.   MRN: [id number]  HPI [name] is a 33 yo m with HIV, CD 4 count 1150/VL<20, on stribild. He has long standing anxiety-depression. Much better from anxiety, did have one night of suicide ideation but no plan. He has good social support with his partner.  Outpatient Encounter Prescriptions as of 01/30/2014  Medication Sig  . acetaminophen (TYLENOL) 325 MG tablet Take 650 mg by mouth every 6 (six) hours as needed for mild pain.  . Aspirin-Salicylamide-Caffeine (BC HEADACHE POWDER PO) Take 1 Package by mouth.  [name] atenolol (TENORMIN) 50 MG tablet Take 50 mg by mouth daily.  . celecoxib (CELEBREX) 200 MG capsule Take 1 capsule (200 mg total) by mouth 2 (two) times daily.  . cyclobenzaprine (FLEXERIL) 10 MG tablet Take 1 tablet (10 mg total) by mouth 3 (three) times daily as needed for muscle spasms.  . DULoxetine (CYMBALTA) 60 MG capsule Take 90 mg by mouth daily.  [name] elvitegravir-cobicistat-emtricitabine-tenofovir (STRIBILD) 150-150-200-300 MG TABS tablet Take 1 tablet by mouth daily with breakfast. For HIV  . gabapentin (NEURONTIN) 300 MG capsule Take 800 mg by mouth 3 (three) times daily.  [name] gemfibrozil (LOPID) 600 MG tablet TAKE 1 TABLET BY MOUTH TWICE DAILY BEFORE MEALS  . ibuprofen (ADVIL,MOTRIN) 200 MG tablet Take 200 mg by mouth every 6 (six) hours as needed.  . Melatonin 5 MG CAPS Take 1 capsule by mouth at bedtime.  . Multiple Vitamins-Minerals (MULTIVITAMIN WITH MINERALS) tablet Take 1 tablet by mouth daily.  [name] omeprazole (PRILOSEC) 40 MG capsule Take 40 mg by mouth daily.  . QUEtiapine (SEROQUEL XR) 50 MG TB24 24 hr tablet Take 50 mg by mouth at bedtime. Take 1-2 QHS  . [DISCONTINUED] DULoxetine (CYMBALTA) 60 MG capsule Take 1 capsule (60 mg total) by mouth daily.  . [DISCONTINUED] gabapentin (NEURONTIN) 300 MG capsule Take 2 capsules (600 mg  total) by mouth 3 (three) times daily.  . [DISCONTINUED] gemfibrozil (LOPID) 600 MG tablet Take 600 mg by mouth 2 (two) times daily before a meal.  . [DISCONTINUED] buPROPion (WELLBUTRIN XL) 150 MG 24 hr tablet Take 1 tablet (150 mg total) by mouth daily. For depression.  . [DISCONTINUED] QUEtiapine (SEROQUEL) 200 MG tablet Take 1 tablet (200 mg total) by mouth at bedtime.     Patient Active Problem List   Diagnosis Date Noted  . Opioid dependence 09/03/2013  . MDD (major depressive disorder), recurrent severe, without psychosis 08/06/2013  . Severe recurrent major depression without psychotic features 05/28/2013  . Dyslipidemia 05/28/2013  . Hypertriglyceridemia 05/28/2013  . Polysubstance (including opioids) dependence without physiological dependence 05/28/2013  . Chronic pain syndrome 05/28/2013  . Monocular diplopia 02/14/2012  . H/O herpes zoster 02/14/2012  . Bursitis of hip 02/14/2012  . Human immunodeficiency virus (HIV) disease 02/09/2012  . Restless leg syndrome 02/09/2012  . Anxiety 02/09/2012     Health Maintenance Due  Topic Date Due  . Tetanus/tdap  01/06/2002  . Influenza Vaccine  12/17/2013     Review of Systems + weight gain, anxiety Physical Exam   BP 125/78  Pulse 87  Temp(Src) 97.7 F (36.5 C) (Oral)  Ht  (1.727 m)  Wt 229 lb (103.874 kg)  BMI 34.83 kg/m2 Physical Exam  Constitutional: He is oriented to person, place, and time. He appears well-developed and well-nourished.  No distress.  HENT:  Mouth/Throat: Oropharynx is clear and moist. No oropharyngeal exudate.  Cardiovascular: Normal rate, regular rhythm and normal heart sounds. Exam reveals no gallop and no friction rub.  No murmur heard.  Pulmonary/Chest: Effort normal and breath sounds normal. No respiratory distress. He has no wheezes.  Abdominal: Soft. Bowel sounds are normal. He exhibits no distension. There is no tenderness.  Lymphadenopathy:  He has no cervical adenopathy.    Neurological: He is alert and oriented to person, place, and time.  Skin: Skin is warm and dry. No rash noted. No erythema.  Psychiatric: He has a normal mood and affect. His behavior is normal.    Lab Results  Component Value Date   CD4TCELL 20* 11/15/2013   Lab Results  Component Value Date   CD4TABS 1150 11/15/2013   CD4TABS 1210 08/11/2013   CD4TABS 1140 04/25/2013   Lab Results  Component Value Date   HIV1RNAQUANT <20 11/15/2013   Lab Results  Component Value Date   HEPBSAB POS* 01/29/2012   No results found for this basename: RPR    CBC Lab Results  Component Value Date   WBC 16.2* 11/25/2013   RBC 4.14* 11/25/2013   HGB 13.6 11/25/2013   HCT 39.9 11/25/2013   PLT 362 11/25/2013   MCV 96.4 11/25/2013   MCH 32.9 11/25/2013   MCHC 34.1 11/25/2013   RDW 14.8 11/25/2013   LYMPHSABS 5.8* 11/15/2013   MONOABS 0.7 11/15/2013   EOSABS 0.3 11/15/2013   BASOSABS 0.0 11/15/2013   BMET Lab Results  Component Value Date   NA 143 11/25/2013   K 4.0 11/25/2013   CL 105 11/25/2013   CO2 21 11/25/2013   GLUCOSE 98 11/25/2013   BUN 12 11/25/2013   CREATININE 1.23 11/25/2013   CALCIUM 9.0 11/25/2013   GFRNONAA 78* 11/25/2013   GFRAA 90* 11/25/2013     Assessment and Plan   hiv = well controlled  Health maintenance = anoscopy, flu and tdap today  Weight gain = nutritionist  Insomnia = likely related to anxiety. Defer to [facility] for management. Appears that seroquel helps  Anxiety/depression = he reports that he knows when to seek help if his symptoms worsen. No SI, plan presently.

## 2014-02-01 ENCOUNTER — Emergency Department (HOSPITAL_COMMUNITY): Payer: Self-pay

## 2014-02-01 ENCOUNTER — Encounter (HOSPITAL_COMMUNITY): Payer: Self-pay | Admitting: Emergency Medicine

## 2014-02-01 ENCOUNTER — Emergency Department (HOSPITAL_COMMUNITY)
Admission: EM | Admit: 2014-02-01 | Discharge: 2014-02-02 | Disposition: A | Discharge status: Other Acute Inpatient Hospital | Payer: Self-pay | Attending: Emergency Medicine | Admitting: Emergency Medicine

## 2014-02-01 DIAGNOSIS — Z8781 Personal history of (healed) traumatic fracture: Secondary | ICD-10-CM | POA: Insufficient documentation

## 2014-02-01 DIAGNOSIS — R000 Tachycardia, unspecified: Secondary | ICD-10-CM | POA: Insufficient documentation

## 2014-02-01 DIAGNOSIS — R4789 Other speech disturbances: Secondary | ICD-10-CM | POA: Insufficient documentation

## 2014-02-01 DIAGNOSIS — Z21 Asymptomatic human immunodeficiency virus [HIV] infection status: Secondary | ICD-10-CM | POA: Insufficient documentation

## 2014-02-01 DIAGNOSIS — F419 Anxiety disorder, unspecified: Secondary | ICD-10-CM

## 2014-02-01 DIAGNOSIS — M129 Arthropathy, unspecified: Secondary | ICD-10-CM | POA: Insufficient documentation

## 2014-02-01 DIAGNOSIS — F1210 Cannabis abuse, uncomplicated: Secondary | ICD-10-CM | POA: Insufficient documentation

## 2014-02-01 DIAGNOSIS — F17200 Nicotine dependence, unspecified, uncomplicated: Secondary | ICD-10-CM | POA: Insufficient documentation

## 2014-02-01 DIAGNOSIS — F3289 Other specified depressive episodes: Secondary | ICD-10-CM | POA: Insufficient documentation

## 2014-02-01 DIAGNOSIS — F1910 Other psychoactive substance abuse, uncomplicated: Secondary | ICD-10-CM

## 2014-02-01 DIAGNOSIS — Z880 Allergy status to penicillin: Secondary | ICD-10-CM | POA: Insufficient documentation

## 2014-02-01 DIAGNOSIS — R443 Hallucinations, unspecified: Secondary | ICD-10-CM | POA: Insufficient documentation

## 2014-02-01 DIAGNOSIS — F22 Delusional disorders: Secondary | ICD-10-CM | POA: Insufficient documentation

## 2014-02-01 DIAGNOSIS — F332 Major depressive disorder, recurrent severe without psychotic features: Secondary | ICD-10-CM

## 2014-02-01 DIAGNOSIS — F1310 Sedative, hypnotic or anxiolytic abuse, uncomplicated: Secondary | ICD-10-CM | POA: Insufficient documentation

## 2014-02-01 DIAGNOSIS — F1920 Other psychoactive substance dependence, uncomplicated: Secondary | ICD-10-CM | POA: Diagnosis present

## 2014-02-01 DIAGNOSIS — F329 Major depressive disorder, single episode, unspecified: Secondary | ICD-10-CM | POA: Insufficient documentation

## 2014-02-01 DIAGNOSIS — Z9089 Acquired absence of other organs: Secondary | ICD-10-CM | POA: Insufficient documentation

## 2014-02-01 DIAGNOSIS — K921 Melena: Secondary | ICD-10-CM | POA: Insufficient documentation

## 2014-02-01 DIAGNOSIS — F1120 Opioid dependence, uncomplicated: Secondary | ICD-10-CM | POA: Insufficient documentation

## 2014-02-01 DIAGNOSIS — R45851 Suicidal ideations: Secondary | ICD-10-CM | POA: Insufficient documentation

## 2014-02-01 DIAGNOSIS — Z87442 Personal history of urinary calculi: Secondary | ICD-10-CM | POA: Insufficient documentation

## 2014-02-01 DIAGNOSIS — F411 Generalized anxiety disorder: Secondary | ICD-10-CM | POA: Insufficient documentation

## 2014-02-01 DIAGNOSIS — Z791 Long term (current) use of non-steroidal anti-inflammatories (NSAID): Secondary | ICD-10-CM | POA: Insufficient documentation

## 2014-02-01 DIAGNOSIS — Z79899 Other long term (current) drug therapy: Secondary | ICD-10-CM | POA: Insufficient documentation

## 2014-02-01 DIAGNOSIS — F1410 Cocaine abuse, uncomplicated: Secondary | ICD-10-CM | POA: Insufficient documentation

## 2014-02-01 LAB — RAPID URINE DRUG SCREEN, HOSP PERFORMED
AMPHETAMINES: POSITIVE — AB
BARBITURATES: NOT DETECTED
BENZODIAZEPINES: POSITIVE — AB
COCAINE: POSITIVE — AB
Opiates: POSITIVE — AB
Tetrahydrocannabinol: POSITIVE — AB

## 2014-02-01 LAB — CBC
HEMATOCRIT: 44.4 % (ref 39.0–52.0)
Hemoglobin: 15.2 g/dL (ref 13.0–17.0)
MCH: 33 pg (ref 26.0–34.0)
MCHC: 34.2 g/dL (ref 30.0–36.0)
MCV: 96.3 fL (ref 78.0–100.0)
Platelets: 349 10*3/uL (ref 150–400)
RBC: 4.61 MIL/uL (ref 4.22–5.81)
RDW: 14.5 % (ref 11.5–15.5)
WBC: 13.3 10*3/uL — ABNORMAL HIGH (ref 4.0–10.5)

## 2014-02-01 LAB — COMPREHENSIVE METABOLIC PANEL
ALT: 35 U/L (ref 0–53)
ANION GAP: 17 — AB (ref 5–15)
AST: 50 U/L — AB (ref 0–37)
Albumin: 4.9 g/dL (ref 3.5–5.2)
Alkaline Phosphatase: 151 U/L — ABNORMAL HIGH (ref 39–117)
BILIRUBIN TOTAL: 1.1 mg/dL (ref 0.3–1.2)
BUN: 18 mg/dL (ref 6–23)
CO2: 22 mEq/L (ref 19–32)
Calcium: 9.9 mg/dL (ref 8.4–10.5)
Chloride: 103 mEq/L (ref 96–112)
Creatinine, Ser: 1.23 mg/dL (ref 0.50–1.35)
GFR calc Af Amer: 89 mL/min — ABNORMAL LOW (ref >90)
GFR calc non Af Amer: 77 mL/min — ABNORMAL LOW (ref >90)
Glucose, Bld: 117 mg/dL — ABNORMAL HIGH (ref 70–99)
Potassium: 3.6 mEq/L — ABNORMAL LOW (ref 3.7–5.3)
Sodium: 142 mEq/L (ref 137–147)
Total Protein: 8.7 g/dL — ABNORMAL HIGH (ref 6.0–8.3)

## 2014-02-01 LAB — ETHANOL: Alcohol, Ethyl (B): <11 mg/dL (ref 0–11)

## 2014-02-01 LAB — ACETAMINOPHEN LEVEL

## 2014-02-01 LAB — SALICYLATE LEVEL: Salicylate Lvl: <2 mg/dL — ABNORMAL LOW (ref 2.8–20.0)

## 2014-02-01 MED ORDER — CELECOXIB 200 MG PO CAPS: 200.0000 mg | ORAL_CAPSULE | Freq: Two times a day (BID) | ORAL | Status: DC

## 2014-02-01 MED ORDER — NICOTINE 21 MG/24HR TD PT24
21.0000 mg | MEDICATED_PATCH | Freq: Every day | TRANSDERMAL | Status: DC
Start: 2014-02-01 — End: 2014-02-02
  Filled 2014-02-01: qty 1

## 2014-02-01 MED ORDER — GABAPENTIN 800 MG PO TABS
800.0000 mg | ORAL_TABLET | Freq: Three times a day (TID) | ORAL | Status: DC
  Filled 2014-02-01: qty 1

## 2014-02-01 MED ORDER — PANTOPRAZOLE SODIUM 40 MG PO TBEC
40.0000 mg | DELAYED_RELEASE_TABLET | Freq: Every day | ORAL | Status: DC
  Administered 2014-02-01 – 2014-02-02 (×2): 40 mg via ORAL
  Filled 2014-02-01 (×2): qty 1

## 2014-02-01 MED ORDER — GEMFIBROZIL 600 MG PO TABS
600.0000 mg | ORAL_TABLET | Freq: Two times a day (BID) | ORAL | Status: DC
  Administered 2014-02-02 (×2): 600 mg via ORAL
  Filled 2014-02-01 (×3): qty 1

## 2014-02-01 MED ORDER — IBUPROFEN 200 MG PO TABS: 600.0000 mg | ORAL_TABLET | Freq: Three times a day (TID) | ORAL | Status: DC | PRN

## 2014-02-01 MED ORDER — ACETAMINOPHEN 325 MG PO TABS
650.0000 mg | ORAL_TABLET | ORAL | Status: DC | PRN
  Administered 2014-02-01: 650 mg via ORAL
  Filled 2014-02-01: qty 2

## 2014-02-01 MED ORDER — GABAPENTIN 400 MG PO CAPS
800.0000 mg | ORAL_CAPSULE | Freq: Three times a day (TID) | ORAL | Status: DC
  Administered 2014-02-01 – 2014-02-02 (×3): 800 mg via ORAL
  Filled 2014-02-01 (×3): qty 2

## 2014-02-01 MED ORDER — HYDROXYZINE HCL 50 MG PO TABS
50.0000 mg | ORAL_TABLET | Freq: Three times a day (TID) | ORAL | Status: DC | PRN
  Administered 2014-02-01 – 2014-02-02 (×2): 100 mg via ORAL
  Filled 2014-02-01 (×2): qty 2

## 2014-02-01 MED ORDER — LORAZEPAM 1 MG PO TABS: 1.0000 mg | ORAL_TABLET | Freq: Three times a day (TID) | ORAL | Status: DC | PRN

## 2014-02-01 MED ORDER — CLONAZEPAM 0.5 MG PO TABS: 0.5000 mg | ORAL_TABLET | Freq: Every day | ORAL | Status: DC | PRN

## 2014-02-01 MED ORDER — ATENOLOL 50 MG PO TABS
50.0000 mg | ORAL_TABLET | Freq: Every day | ORAL | Status: DC
  Administered 2014-02-01 – 2014-02-02 (×2): 50 mg via ORAL
  Filled 2014-02-01 (×2): qty 1

## 2014-02-01 MED ORDER — QUETIAPINE FUMARATE ER 50 MG PO TB24
50.0000 mg | ORAL_TABLET | Freq: Every day | ORAL | Status: DC
  Administered 2014-02-01: 50 mg via ORAL
  Filled 2014-02-01 (×2): qty 1

## 2014-02-01 MED ORDER — MELATONIN 5 MG PO CAPS: 1.0000 | ORAL_CAPSULE | Freq: Every day | ORAL | Status: DC

## 2014-02-01 MED ORDER — ALUM & MAG HYDROXIDE-SIMETH 200-200-20 MG/5ML PO SUSP: 30.0000 mL | ORAL | Status: DC | PRN

## 2014-02-01 MED ORDER — CLONAZEPAM 0.5 MG PO TABS: 0.5000 mg | ORAL_TABLET | ORAL | Status: DC

## 2014-02-01 MED ORDER — DULOXETINE HCL 30 MG PO CPEP
30.0000 mg | ORAL_CAPSULE | Freq: Every day | ORAL | Status: DC
  Administered 2014-02-01 – 2014-02-02 (×2): 30 mg via ORAL
  Filled 2014-02-01 (×2): qty 1

## 2014-02-01 MED ORDER — ONDANSETRON HCL 4 MG PO TABS: 4.0000 mg | ORAL_TABLET | Freq: Three times a day (TID) | ORAL | Status: DC | PRN

## 2014-02-01 MED ORDER — ELVITEG-COBIC-EMTRICIT-TENOFDF 150-150-200-300 MG PO TABS
1.0000 | ORAL_TABLET | Freq: Every day | ORAL | Status: DC
  Administered 2014-02-01: 1 via ORAL
  Filled 2014-02-01 (×2): qty 1

## 2014-02-01 MED ORDER — CYCLOBENZAPRINE HCL 10 MG PO TABS
10.0000 mg | ORAL_TABLET | Freq: Three times a day (TID) | ORAL | Status: DC | PRN
  Administered 2014-02-01 – 2014-02-02 (×2): 10 mg via ORAL
  Filled 2014-02-01 (×2): qty 1
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
## 2014-02-01 NOTE — ED Notes (Signed)
Pt. Is unable to use the restroom at this time, but is aware that we need a urine specimen. Urine cup at the bedside. 

## 2014-02-01 NOTE — Progress Notes (Signed)
PHARMACIST - PHYSICIAN ORDER COMMUNICATION  CONCERNING: P&T Medication Policy on Herbal Medications  DESCRIPTION:  This patient's order for: melatonin  has been noted.  This product(s) is classified as an "herbal" or natural product. Due to a lack of definitive safety studies or FDA approval, nonstandard manufacturing practices, plus the potential risk of unknown drug-drug interactions while on inpatient medications, the Pharmacy and Therapeutics Committee does not permit the use of "herbal" or natural products of this type within [facility].   ACTION TAKEN: The pharmacy department is unable to verify this order at this time and your patient has been informed of this safety policy. Please reevaluate patient's clinical condition at discharge and address if the herbal or natural product(s) should be resumed at that time.   Juliette Alcide, PharmD, BCPS.   Pager: [phone] 02/01/2014 6:28 PM
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
## 2014-02-01 NOTE — ED Notes (Addendum)
SANE nurse in with patient.
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
## 2014-02-02 ENCOUNTER — Encounter (HOSPITAL_COMMUNITY): Payer: Self-pay

## 2014-02-02 ENCOUNTER — Inpatient Hospital Stay (HOSPITAL_COMMUNITY)
Admission: EM | Admit: 2014-02-02 | Discharge: 2014-02-09 | DRG: 885 | Disposition: A | Discharge status: Home / Self Care | Payer: Federal, State, Local not specified - Other | Source: Intra-hospital | Attending: Psychiatry | Admitting: Psychiatry

## 2014-02-02 DIAGNOSIS — F1510 Other stimulant abuse, uncomplicated: Secondary | ICD-10-CM

## 2014-02-02 DIAGNOSIS — B20 Human immunodeficiency virus [HIV] disease: Secondary | ICD-10-CM

## 2014-02-02 DIAGNOSIS — F1220 Cannabis dependence, uncomplicated: Secondary | ICD-10-CM

## 2014-02-02 DIAGNOSIS — F1410 Cocaine abuse, uncomplicated: Secondary | ICD-10-CM

## 2014-02-02 DIAGNOSIS — Z79899 Other long term (current) drug therapy: Secondary | ICD-10-CM | POA: Diagnosis not present

## 2014-02-02 DIAGNOSIS — F411 Generalized anxiety disorder: Secondary | ICD-10-CM | POA: Diagnosis present

## 2014-02-02 DIAGNOSIS — R45851 Suicidal ideations: Secondary | ICD-10-CM

## 2014-02-02 DIAGNOSIS — Z21 Asymptomatic human immunodeficiency virus [HIV] infection status: Secondary | ICD-10-CM | POA: Diagnosis present

## 2014-02-02 DIAGNOSIS — F410 Panic disorder [episodic paroxysmal anxiety] without agoraphobia: Secondary | ICD-10-CM

## 2014-02-02 DIAGNOSIS — F1110 Opioid abuse, uncomplicated: Secondary | ICD-10-CM

## 2014-02-02 DIAGNOSIS — F1920 Other psychoactive substance dependence, uncomplicated: Secondary | ICD-10-CM

## 2014-02-02 DIAGNOSIS — F1120 Opioid dependence, uncomplicated: Secondary | ICD-10-CM

## 2014-02-02 DIAGNOSIS — F17200 Nicotine dependence, unspecified, uncomplicated: Secondary | ICD-10-CM | POA: Diagnosis present

## 2014-02-02 DIAGNOSIS — F1210 Cannabis abuse, uncomplicated: Secondary | ICD-10-CM | POA: Diagnosis present

## 2014-02-02 DIAGNOSIS — F333 Major depressive disorder, recurrent, severe with psychotic symptoms: Principal | ICD-10-CM | POA: Diagnosis present

## 2014-02-02 DIAGNOSIS — F332 Major depressive disorder, recurrent severe without psychotic features: Secondary | ICD-10-CM

## 2014-02-02 DIAGNOSIS — G8929 Other chronic pain: Secondary | ICD-10-CM | POA: Diagnosis present

## 2014-02-02 DIAGNOSIS — F1193 Opioid use, unspecified with withdrawal: Secondary | ICD-10-CM

## 2014-02-02 MED ORDER — HYDROXYZINE HCL 50 MG PO TABS
50.0000 mg | ORAL_TABLET | Freq: Four times a day (QID) | ORAL | Status: DC | PRN
  Administered 2014-02-03 – 2014-02-08 (×3): 50 mg via ORAL
  Filled 2014-02-02: qty 20
  Filled 2014-02-02 (×3): qty 1

## 2014-02-02 MED ORDER — CELECOXIB 200 MG PO CAPS
200.0000 mg | ORAL_CAPSULE | Freq: Two times a day (BID) | ORAL | Status: DC
  Administered 2014-02-02: 200 mg via ORAL
  Filled 2014-02-02 (×2): qty 1

## 2014-02-02 MED ORDER — CLONAZEPAM 0.5 MG PO TABS
0.5000 mg | ORAL_TABLET | Freq: Once | ORAL | Status: AC
  Administered 2014-02-02: 0.5 mg via ORAL
  Filled 2014-02-02: qty 1

## 2014-02-02 MED ORDER — CELECOXIB 200 MG PO CAPS
200.0000 mg | ORAL_CAPSULE | Freq: Once | ORAL | Status: AC
  Administered 2014-02-02: 200 mg via ORAL
  Filled 2014-02-02: qty 1
  Filled 2014-02-02: qty 2

## 2014-02-02 MED ORDER — CYCLOBENZAPRINE HCL 10 MG PO TABS
10.0000 mg | ORAL_TABLET | Freq: Once | ORAL | Status: AC
  Administered 2014-02-02: 10 mg via ORAL
  Filled 2014-02-02 (×2): qty 1

## 2014-02-02 MED ORDER — QUETIAPINE FUMARATE ER 50 MG PO TB24
50.0000 mg | ORAL_TABLET | Freq: Once | ORAL | Status: AC
  Administered 2014-02-02: 50 mg via ORAL
  Filled 2014-02-02 (×2): qty 1

## 2014-02-02 MED ORDER — ALUM & MAG HYDROXIDE-SIMETH 200-200-20 MG/5ML PO SUSP: 30.0000 mL | ORAL | Status: DC | PRN

## 2014-02-02 MED ORDER — ALUM & MAG HYDROXIDE-SIMETH 200-200-20 MG/5ML PO SUSP
30.0000 mL | ORAL | Status: DC | PRN
  Administered 2014-02-03: 30 mL via ORAL

## 2014-02-02 MED ORDER — GABAPENTIN 400 MG PO CAPS
800.0000 mg | ORAL_CAPSULE | Freq: Once | ORAL | Status: AC
  Administered 2014-02-02: 800 mg via ORAL
  Filled 2014-02-02 (×2): qty 2

## 2014-02-02 MED ORDER — MAGNESIUM HYDROXIDE 400 MG/5ML PO SUSP: 30.0000 mL | Freq: Every day | ORAL | Status: DC | PRN

## 2014-02-02 MED ORDER — ACETAMINOPHEN 325 MG PO TABS: 650.0000 mg | ORAL_TABLET | Freq: Four times a day (QID) | ORAL | Status: DC | PRN
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
## 2014-02-02 NOTE — Discharge Instructions (Signed)
Bath Salts Bath salts is a street name for a group of dangerous drugs used to get high.Bath salts can contain the chemicals methylenedioxypyrovalerone (MDPV), mephedrone, and methylone. Bath salts can have cocaine-like effects. They look like white powder and can be injected, inhaled, swallowed, or snorted. Other names for bath salts include:  [place] Wave.  Purple Wave.  Red Dove.  Blue Silk.  Zoom.  Bloom.  Cloud Nine.  Scarface.  Sallyanne Havers.  Bliss.  White Lightning.  Government social research officer. The bath salts you use in a bath and that you can buy in health and beauty stores are different, and when used correctly, are safe. SYMPTOMS  Taking bath salts changes brain chemistry. Users may experience:  Hallucinations. This is when you see or hear things that are not really there.  Chest pains.  Rapid heartbeat.  Agitation.  Extreme paranoia, irrational fear, or anxiety.  Delusions. This is a strong belief about something that is not real or true.  Psychotic episodes.  Self-harm.  Aggressive behavior posing harm to others. People using bath salts can begin to crave the drug and may become addicted. There may be other short-term and long-term effects that have not yet been studied.  TREATMENT   Short-term emergency medical treatment attempts to preserve life and prevent or minimize damage from drug use. This may include medicines given to manage drug toxicity and antipsychotic medicines.  Long-term substance abuse treatment helps to achieve recovery from drug abuse or addiction. Most hospital caregivers can provide referral information for such treatment if the hospital does not offer it. There are many supportive programs and professionals available to help drug abusers. HOME CARE INSTRUCTIONS   Follow all instructions from your caregiver very carefully.  Take all medicines as prescribed by your caregiver.  Keep all appointments for further evaluation and  counseling.  Do not use drugs, alcohol, or any other mind-altering and mood-altering drugs unless prescribed by your caregiver.  Do not drive or operate machinery until your caregiver says it is okay.  Consider joining a support group.  Avoid socializing with people who use or deal drugs. SEEK IMMEDIATE MEDICAL CARE IF:  You develop severe chest pain.  You develop shortness of breath.  You develop extreme agitation. Document Released: 12/30/2010 Document Revised: 07/28/2011 Document Reviewed: 12/30/2010 [facility] Patient Information 2015 [place], [place]. This information is not intended to replace advice given to you by your health care provider. Make sure you discuss any questions you have with your health care provider.  Deep Brain Stimulation Deep brain stimulation is used to treat certain medical conditions that do not respond to conventional medical treatments. An electric current is sent to an area of the brain that is causing problems. A device that generates this electric current sends it in pulses to a specific area deep inside the brain. Deep brain stimulation has been used to treat several medical conditions. It was first used to treat pain when medicines and other traditional therapies did not work. More recently, it has been used to treat people with medicine-resistant movement disorders, including Parkinson's disease, essential tremor, epilepsy, rare inherited disorders of muscle movement (dystonias), cluster headaches, and multiple sclerosis. Deep brain stimulation is also being used to treat cases of obsessive-compulsive disorder and depression that fail to respond to conventional treatments, such as medicine and shock therapy (electroconvulsive therapy). Deep brain stimulation is a safe and convenient treatment. It can be used 24 hours per day. The intensity and frequency of the pulses can be adjusted. It  can be easily discontinued at any time. Medicines and other treatments can  be used at the same time as deep brain stimulation. Generally, deep brain stimulation can be used without causing permanent damage to brain tissue. The deep brain stimulation device has 3 parts:  A lead. This is a thin wire. It goes through a small opening in the skull. It delivers the electric pulse.  A power source. This is called the neurotransmitter. It is usually placed under the skin in the upper part of the chest, similar to how a heart pacemaker is inserted. It is powered by a long-lasting battery.  An extension. This is a wire that connects the lead to the power source. The extension is passed under the skin of the head and neck and down to the power source. The electric pulses are automatic. The timing is set before the device is placed in the body. A computer can send a radio signal to the neurotransmitter if the frequency of the pulse needs to be adjusted. A hand-held controller can be used to turn the neurotransmitter off. This is done if side effects of treatment are bad or if battery power needs to be conserved. PROCEDURE The surgery to insert a deep brain stimulation device is done in 2 parts:  The lead is inserted:  Numbing medicine (local anesthetic) is injected into your scalp. You are awake but feel no pain.  A small hole is made in your skull.  A computer is used to make a map of your brain. This shows the part of the brain that needs to be treated.  Your caregiver will ask you various questions. This helps the surgeon find the most appropriate part of the brain to place the lead.  The neurotransmitter and extension wire are inserted:  Medicine to make you go to sleep (general anesthetic) is used for this part of the procedure.  A small opening is made in the skin behind your ear. The extension wire is inserted through this opening.  A second opening is made in the upper part of your chest. This is for the neurotransmitter. RISKS AND COMPLICATIONS The procedure to  insert a deep brain stimulation device is generally safe. However, as with all surgical procedures, there are some risks. These risks may be greater for the following people:  People older than 70 years.  People with blood vessel disease.  People receiving antiplatelet or anticoagulant therapy.  People with unstable medical conditions, such as severe coronary artery disease, severe hypertension, uncontrolled diabetes, previous stroke, heart failure, and severe dementia. If complications occur, they are usually temporary. They may include bleeding, leaking of fluid from around the brain, and infection. Possible side effects from insertion of the device include:  Temporary tingling in the face, arms, or legs.  Pain or swelling in the areas where the wires are placed.  An allergic reaction to parts of the device.  Slight problems with vision or speech.  Slight problem with balance.  Slight loss of movement.  Some jolting or shocking sensations. HOME CARE INSTRUCTIONS  Understand how to operate the device. Be sure to ask whether it is okay to turn it off at night.  Ask if there are any times or places when the device should not be turned on. Electrical devices at home will not affect the device. This includes microwaves and computers.  Continue to take any medicine prescribed by your caregiver. Do not start taking any new medicine unless your caregiver says it is okay.  This includes over-the-counter medicines.  Keep all follow-up appointments. The device will need to be checked periodically by your caregiver. Batteries usually last for 3 to 5 years. FOR MORE INFORMATION [employer]: www.aans.org Document Released: 08/20/2010 Document Revised: 07/28/2011 Document Reviewed: 08/20/2010 [facility] Patient Information 2015 [place], [place]. This information is not intended to replace advice given to you by your health care provider. Make sure you discuss any  questions you have with your health care provider.  Gastrointestinal Problems During Exercise Stomach problems that occur during exercise are common among athletes. This is especially true for distance runners and triathletes.  SYMPTOMS   Heartburn.  Feeling sick to stomach (nausea).  Vomiting.  Diarrhea.  Bloating.  Stomach pain.  Rectal bleeding and gassiness (flatulence).  Cramping.  Urge to go the bathroom (defecate). TREATMENT OF UPPER GI PROBLEMS The treatment of problems (symptoms) from the upper GI (gastrointestinal) tract caused by exercise are helped by:  Avoiding solid foods for 3 to 4 hours before intense exercise.  Eating a diet high in carbohydrates and very low in protein and fat.  Medications from your caregiver are also available to help upper GI problems. Some of these include antacids to neutralize the acids in your stomach. GASTROINTESTINAL BLEEDING Bleeding from the GI tract may occur with intense exercise. It is especially common in marathon runners. The bleeding is thought to come mostly from the stomach. It may also come from the large bowel (colon). You should not assume the bleeding is caused by exercise. If you have any problems with gastrointestinal bleeding, it is extremely important to see your caregiver for evaluation to make sure there are not other causes of bleeding. TREATMENT OF BLEEDING  Once it is known that exercise is the cause of blood loss, maintaining adequate fluid intake to avoid dehydration may be helpful.  Wear good running shoes to cut down on jarring the body during workouts.  It is good to have your blood (hemoglobin) and the iron stores of your body checked periodically, to see if iron replacement may be necessary.  Take medications prescribed by your caregiver as directed. Abdominal pain in athletes is common during intense activities. The cause is unknown. It may be due to spasms in the muscular division between the abdomen  and chest (the diaphragm), or by air trapped in parts of the large bowel. Abdominal pain may be helped by avoiding large meals prior to exercise.  Runner's diarrhea is a very common complaint among runners. The diarrhea seems to be related to the intensity of the exercise. It is more common during competition than during regular exercise. There are many different possible causes of this and it may be a combination of all of them. Treatment of diarrhea may be difficult. A reduction in fiber intake a day and a half before competition may be helpful. If the problem persists, antidiarrheal medicine may be used.  GENERALIZATIONS ABOUT TREATING ABDOMINAL EXERCISE PROBLEMS  Limit dietary fiber prior to competition.  Avoid solid foods at least three hours prior to a race.  Eat a pre-meal diet which is high in carbohydrates.  Avoid fat and protein intake during endurance events.  Drink fluids frequently during competition taking in two to three, 8 ounce glasses of fluid per hour. These should replace sodium, potassium, and carbohydrate in dilute solutions. Concentrated drinks may cause GI problems and aggravate existing conditions. Document Released: 01/28/2001 Document Revised: 07/28/2011 Document Reviewed: 05/05/2005 [facility] Patient Information 2015 [place], [place]. This information is not  intended to replace advice given to you by your health care provider. Make sure you discuss any questions you have with your health care provider. ° °

## 2014-02-02 NOTE — ED Notes (Signed)
Per TTS, patient accepted to [id number] by Dr. Jannifer Franklin.
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
## 2014-02-02 NOTE — ED Provider Notes (Signed)
Medical screening examination/treatment/procedure(s) were performed by non-physician practitioner and as supervising physician I was immediately available for consultation/collaboration.    Rockland Kotarski, MD 02/02/14 1558 

## 2014-02-02 NOTE — ED Notes (Signed)
Patient reports feelings of anxiety and depression 10/10. States that the last seven years of his life are coming to an end, referring to his relationship. Patient states "I can't stop SI", If I could hurt myself, I would".   Encouragement offered.  Q 15 safety checks in place.

## 2014-02-03 ENCOUNTER — Ambulatory Visit (HOSPITAL_COMMUNITY)
Admit: 2014-02-03 | Discharge: 2014-02-03 | Disposition: A | Discharge status: Home / Self Care | Payer: Federal, State, Local not specified - Other | Attending: Psychiatry | Admitting: Psychiatry

## 2014-02-03 DIAGNOSIS — F332 Major depressive disorder, recurrent severe without psychotic features: Secondary | ICD-10-CM

## 2014-02-03 DIAGNOSIS — R45851 Suicidal ideations: Secondary | ICD-10-CM

## 2014-02-03 MED ORDER — CELECOXIB 100 MG PO CAPS
200.0000 mg | ORAL_CAPSULE | Freq: Two times a day (BID) | ORAL | Status: DC
  Administered 2014-02-03 – 2014-02-09 (×12): 200 mg via ORAL
  Filled 2014-02-03 (×3): qty 1
  Filled 2014-02-03: qty 2
  Filled 2014-02-03 (×9): qty 1
  Filled 2014-02-03: qty 40
  Filled 2014-02-03: qty 1
  Filled 2014-02-03: qty 40
  Filled 2014-02-03: qty 1

## 2014-02-03 MED ORDER — ATENOLOL 50 MG PO TABS
50.0000 mg | ORAL_TABLET | Freq: Every day | ORAL | Status: DC
  Administered 2014-02-03 – 2014-02-09 (×7): 50 mg via ORAL
  Filled 2014-02-03: qty 2
  Filled 2014-02-03 (×8): qty 1
  Filled 2014-02-03: qty 2
  Filled 2014-02-03: qty 14

## 2014-02-03 MED ORDER — DULOXETINE HCL 60 MG PO CPEP
60.0000 mg | ORAL_CAPSULE | Freq: Every day | ORAL | Status: DC
  Administered 2014-02-03 – 2014-02-06 (×4): 60 mg via ORAL
  Filled 2014-02-03 (×6): qty 1

## 2014-02-03 MED ORDER — CLONIDINE HCL 0.1 MG PO TABS
0.1000 mg | ORAL_TABLET | Freq: Four times a day (QID) | ORAL | Status: AC
  Administered 2014-02-03 – 2014-02-05 (×7): 0.1 mg via ORAL
  Filled 2014-02-03 (×12): qty 1

## 2014-02-03 MED ORDER — METHOCARBAMOL 500 MG PO TABS
500.0000 mg | ORAL_TABLET | Freq: Three times a day (TID) | ORAL | Status: DC | PRN
  Administered 2014-02-03 – 2014-02-08 (×11): 500 mg via ORAL
  Filled 2014-02-03 (×12): qty 1

## 2014-02-03 MED ORDER — PANTOPRAZOLE SODIUM 40 MG PO TBEC
40.0000 mg | DELAYED_RELEASE_TABLET | Freq: Every day | ORAL | Status: DC
  Administered 2014-02-04 – 2014-02-09 (×6): 40 mg via ORAL
  Filled 2014-02-03 (×10): qty 1

## 2014-02-03 MED ORDER — ONDANSETRON 4 MG PO TBDP
4.0000 mg | ORAL_TABLET | Freq: Four times a day (QID) | ORAL | Status: AC | PRN
  Administered 2014-02-03: 4 mg via ORAL
  Filled 2014-02-03: qty 1

## 2014-02-03 MED ORDER — GABAPENTIN 400 MG PO CAPS: 800.0000 mg | ORAL_CAPSULE | Freq: Three times a day (TID) | ORAL | Status: DC

## 2014-02-03 MED ORDER — DOXEPIN HCL 10 MG PO CAPS
10.0000 mg | ORAL_CAPSULE | Freq: Every day | ORAL | Status: DC
  Administered 2014-02-03 – 2014-02-08 (×6): 10 mg via ORAL
  Filled 2014-02-03 (×7): qty 1
  Filled 2014-02-03: qty 14

## 2014-02-03 MED ORDER — GABAPENTIN 400 MG PO CAPS
800.0000 mg | ORAL_CAPSULE | Freq: Three times a day (TID) | ORAL | Status: DC
  Administered 2014-02-03 (×2): 800 mg via ORAL
  Filled 2014-02-03 (×8): qty 2

## 2014-02-03 MED ORDER — CYCLOBENZAPRINE HCL 10 MG PO TABS: 5.0000 mg | ORAL_TABLET | Freq: Three times a day (TID) | ORAL | Status: DC | PRN

## 2014-02-03 MED ORDER — GABAPENTIN 400 MG PO CAPS: 400.0000 mg | ORAL_CAPSULE | Freq: Three times a day (TID) | ORAL | Status: DC

## 2014-02-03 MED ORDER — GEMFIBROZIL 600 MG PO TABS
600.0000 mg | ORAL_TABLET | Freq: Two times a day (BID) | ORAL | Status: DC
  Administered 2014-02-03 – 2014-02-09 (×12): 600 mg via ORAL
  Filled 2014-02-03 (×5): qty 1
  Filled 2014-02-03: qty 20
  Filled 2014-02-03 (×11): qty 1
  Filled 2014-02-03: qty 20

## 2014-02-03 MED ORDER — CLONIDINE HCL 0.1 MG PO TABS
0.1000 mg | ORAL_TABLET | Freq: Every day | ORAL | Status: AC
  Administered 2014-02-08 – 2014-02-09 (×2): 0.1 mg via ORAL
  Filled 2014-02-03 (×2): qty 1

## 2014-02-03 MED ORDER — DICYCLOMINE HCL 20 MG PO TABS: 20.0000 mg | ORAL_TABLET | Freq: Four times a day (QID) | ORAL | Status: AC | PRN

## 2014-02-03 MED ORDER — GABAPENTIN 400 MG PO CAPS
800.0000 mg | ORAL_CAPSULE | Freq: Three times a day (TID) | ORAL | Status: DC
  Administered 2014-02-03 – 2014-02-05 (×5): 800 mg via ORAL
  Filled 2014-02-03 (×13): qty 2

## 2014-02-03 MED ORDER — CLONAZEPAM 0.5 MG PO TABS
0.2500 mg | ORAL_TABLET | Freq: Two times a day (BID) | ORAL | Status: DC
  Administered 2014-02-03 – 2014-02-06 (×7): 0.25 mg via ORAL
  Filled 2014-02-03 (×7): qty 1

## 2014-02-03 MED ORDER — NAPROXEN 500 MG PO TABS: 500.0000 mg | ORAL_TABLET | Freq: Two times a day (BID) | ORAL | Status: DC | PRN

## 2014-02-03 MED ORDER — FLUOXETINE HCL 20 MG PO CAPS
20.0000 mg | ORAL_CAPSULE | Freq: Every day | ORAL | Status: DC
  Administered 2014-02-03 – 2014-02-06 (×4): 20 mg via ORAL
  Filled 2014-02-03 (×6): qty 1

## 2014-02-03 MED ORDER — ELVITEG-COBIC-EMTRICIT-TENOFDF 150-150-200-300 MG PO TABS
1.0000 | ORAL_TABLET | Freq: Every day | ORAL | Status: DC
  Administered 2014-02-03 – 2014-02-09 (×7): 1 via ORAL
  Filled 2014-02-03 (×7): qty 1
  Filled 2014-02-03: qty 3
  Filled 2014-02-03: qty 1

## 2014-02-03 MED ORDER — PANTOPRAZOLE SODIUM 40 MG PO TBEC: 40.0000 mg | DELAYED_RELEASE_TABLET | Freq: Every day | ORAL | Status: DC

## 2014-02-03 MED ORDER — LOPERAMIDE HCL 2 MG PO CAPS
2.0000 mg | ORAL_CAPSULE | ORAL | Status: AC | PRN
  Administered 2014-02-04: 4 mg via ORAL
  Filled 2014-02-03: qty 2

## 2014-02-03 MED ORDER — CLONIDINE HCL 0.1 MG PO TABS
0.1000 mg | ORAL_TABLET | ORAL | Status: AC
  Administered 2014-02-05 – 2014-02-07 (×4): 0.1 mg via ORAL
  Filled 2014-02-03 (×4): qty 1

## 2014-02-03 NOTE — Progress Notes (Signed)
[facility] Group Notes:  (Nursing/MHT/Case Management/Adjunct)  Date:  02/03/2014  Time:  9:24 PM  Type of Therapy:  Group Therapy  Participation Level:  Active  Participation Quality:  Appropriate  Affect:  Appropriate  Cognitive:  Appropriate  Insight:  Appropriate  Engagement in Group:  Engaged  Modes of Intervention:  Socialization and Support  Summary of Progress/Problems: Pt. Stated his energy level was a 2.  Pt. Was able to identify coping skill of listening to music.  Sondra Come 02/03/2014, 9:24 PM

## 2014-02-03 NOTE — Progress Notes (Signed)
D:  PT passive SI-contracts for safety. Pt denies HI/AVH. Pt is pleasant and cooperative. Pt stated he felt a little better to day. His depression has gone from 10 to 9.   A: Pt was offered support and encouragement. Pt was given scheduled medications. Pt was encourage to attend groups. Q 15 minute checks were done for safety.   R:Pt attends groups and interacts well with peers and staff. Pt is taking medication. Pt receptive to treatment and safety maintained on unit.

## 2014-02-03 NOTE — [facility] Group Notes (Signed)
[facility] LCSW Aftercare Discharge Planning Group Note   02/03/2014 10:31 AM  Participation Quality:  Invited.  Chose to not attend    Devin Crawford, Devin Crawford

## 2014-02-03 NOTE — Progress Notes (Signed)
Patient ID: Devin Crawford, male   DOB: [birth year], 33 y.o.   MRN: [id number] D. Patient presents with anxious mood, affect congruent. [name] states '' I don't really know what to say, so much has been going on and I've been feeling overwhelmed. '' Patient reports that his mind '' wasn't right. '' and came for help. Patient presents with pressured speech, anxious but cooperative with staff. Pt currently denies any AH/VH - he endorses passive SI but is able to contract for safety.[name] He completed his self inventory and rates his depression at 10/10 with 10 being worst 1 being least depressed. A. Support and encouragement provided to patient. Discussed above information and patients reports with Dr. Elna Breslow and treatment team. R.Patient has been cooperative and visible on the unit. He denies any acute concerns at this time. Will continue to monitor q 15 minutes for safety.

## 2014-02-03 NOTE — Clinical Social Work Note (Signed)
CSW Intern spoke to pt about [employer]. Pt needed to contact his employer before making a decision. CSW will check in with him on Monday.
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
## 2014-02-03 NOTE — [facility] Suicide Risk Assessment (Signed)
   Nursing information obtained from:    Demographic factors:    Current Mental Status:    Loss Factors:    Historical Factors:    Risk Reduction Factors:    Total Time spent with patient: 20 minutes  CLINICAL FACTORS:   Severe Anxiety and/or Agitation Panic Attacks Depression:   Comorbid alcohol abuse/dependence Hopelessness Impulsivity Insomnia Alcohol/Substance Abuse/Dependencies Previous Psychiatric Diagnoses and Treatments Medical Diagnoses and Treatments/Surgeries  Psychiatric Specialty Exam: Physical Exam  Constitutional: He is oriented to person, place, and time. He appears well-developed and well-nourished.  HENT:  Head: Normocephalic and atraumatic.  Eyes: Conjunctivae are normal. Pupils are equal, round, and reactive to light.  Neck: Normal range of motion. Neck supple.  Cardiovascular: Normal rate and regular rhythm.   Respiratory: Effort normal and breath sounds normal.  GI: Soft. Bowel sounds are normal.  Musculoskeletal: Normal range of motion.  Neurological: He is alert and oriented to person, place, and time.  Skin: Skin is warm and dry.  Psychiatric: His speech is normal and behavior is normal. His mood appears anxious. Cognition and memory are normal. He expresses impulsivity. He exhibits a depressed mood. He expresses suicidal ideation.    Review of Systems  Constitutional: Negative.   HENT: Negative.   Eyes: Negative.   Respiratory: Negative.   Cardiovascular: Negative.   Gastrointestinal: Negative.   Genitourinary: Negative.   Musculoskeletal: Negative.   Skin: Negative.   Neurological: Negative.   Psychiatric/Behavioral: Positive for depression, suicidal ideas and substance abuse. The patient is nervous/anxious and has insomnia.     Blood pressure 118/67, pulse 111, temperature 98.2 F (36.8 C), temperature source Oral, resp. rate 18, height 5' 8.25" (1.734 m), weight 98.884 kg (218 lb).Body mass index is 32.89 kg/(m^2).   Please see H&P for  mental status examination   COGNITIVE FEATURES THAT CONTRIBUTE TO RISK:  Closed-mindedness Polarized thinking Thought constriction (tunnel vision)    SUICIDE RISK:   Moderate:  Frequent suicidal ideation with limited intensity, and duration, some specificity in terms of plans, no associated intent, good self-control, limited dysphoria/symptomatology, some risk factors present, and identifiable protective factors, including available and accessible social support.  PLAN OF CARE:  I certify that inpatient services furnished can reasonably be expected to improve the patient's condition.  Rosanne Wohlfarth 02/03/2014, 12:35 PM
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
## 2014-02-03 NOTE — [facility] Group Notes (Signed)
[facility] LCSW Group Therapy  02/03/2014 3:55 PM  Type of Therapy:  Group Therapy  Participation Level:  Minimal  Participation Quality:  Appropriate  Affect:  Depressed  Cognitive:  Appropriate  Insight:  Limited  Engagement in Therapy:  Limited  Modes of Intervention:  Discussion, Education, Socialization and Support  Summary of Progress/Problems:Feelings around Relapse. Group members discussed the meaning of relapse and shared personal stories of relapse, how it affected them and others, and how they perceived themselves during this time. Group members were encouraged to identify triggers, warning signs and coping skills used when facing the possibility of relapse. Social supports were discussed and explored in detail. Pt expressed concern that the people in his life would stop caring about him. He stated he is losing hope because a relationship might be ending due to behavior recently. He expressed feeling care when other patients sat with him at lunch.   Hyatt,Daksha Koone 02/03/2014, 3:55 PM
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
## 2014-02-04 DIAGNOSIS — F1120 Opioid dependence, uncomplicated: Secondary | ICD-10-CM

## 2014-02-04 DIAGNOSIS — F1920 Other psychoactive substance dependence, uncomplicated: Secondary | ICD-10-CM

## 2014-02-04 NOTE — Progress Notes (Signed)
Patient ID: Devin Crawford, male   DOB: [birth year], 33 y.o.   MRN: [id number] D. Patient presents with anxious mood, affect congruent. Devin Crawford reports that he is doing '' okay'' and he reports withdrawal is ''going well '' He states ''I'm glad I'm here to get the help. I'm worried about my arm, what is the doctor going to do about that? '' Pt did report some diarrhea , but effective with prn medication. Devin Crawford completed self inventory and rates his depression at 8/10 on depression scale, 10 being worst depression 1 being least. He also endorses passive SI but is able to contract for safety. Devin Crawford has been visible on the unit, attending unit programming. A. Medications given as ordered. Support and encouragement provided. Discussed patient progress and concerns with A. Nwoko NP . R. Patient is safe on the unit, in no acute distress at this time. No further voiced concerns at this time. Will continue to monitor q 15 minutes for safety.

## 2014-02-04 NOTE — [facility] Group Notes (Signed)
[facility] Group Notes:  (Nursing/MHT/Case Management/Adjunct)  Date:  02/04/2014  Time:  12:40 PM  Type of Therapy:  Psychoeducational Skills-Patient Self Inventory Group  Participation Level:  Active  Participation Quality:  Appropriate  Affect:  Appropriate  Cognitive:  Appropriate  Insight:  Appropriate  Engagement in Group:  Engaged  Modes of Intervention:  Problem-solving  Summary of Progress/Problems: Patient attended patient self inventory group.   Devin Crawford 02/04/2014, 12:40 PM

## 2014-02-04 NOTE — [facility] Group Notes (Signed)
[facility] Group Notes:  (Nursing/MHT/Case Management/Adjunct)  Date:  02/04/2014  Time:  1:04 PM  Type of Therapy:  Psychoeducational Skills-Healthy Coping Skills Group  Participation Level:  Active  Participation Quality:  Appropriate  Affect:  Appropriate  Cognitive:  Appropriate  Insight:  Appropriate  Engagement in Group:  Engaged  Modes of Intervention:  Problem-solving  Summary of Progress/Problems: Patient attended patient healthy coping skills group.  Jacquelyne Balint Shanta 02/04/2014, 1:04 PM

## 2014-02-04 NOTE — [facility] Group Notes (Signed)
[facility] Group Notes:  (Clinical Social Work)  02/04/2014  11:15-12:00PM  Summary of Progress/Problems:   The main focus of today's process group was to discuss patients' feelings about hospitalization, the stigma attached to mental health, and sources of motivation to stay well.  We then worked to identify a specific plan to avoid future hospitalizations when discharged from the hospital for this admission.  The patient expressed that he feels hopeful about being in the hospital, that he is here for help with his depression and anxiety and thinks that can happen in this location.  He shared well with the group about his feelings of being labeled by his mental health diagnosis, and he expressed positive feelings toward the idea of learning more about his own diagnosis so that he can educate others.    Type of Therapy:  Group Therapy - Process  Participation Level:  Active  Participation Quality:  Appropriate, Attentive, Sharing and Supportive  Affect:  Appropriate  Cognitive:  Alert, Appropriate and Oriented  Insight:  Engaged  Engagement in Therapy:  Engaged  Modes of Intervention:  Exploration, Discussion  Ambrose Mantle, LCSW 02/04/2014, 1:14 PM

## 2014-02-04 NOTE — Progress Notes (Signed)
Patient ID: Devin Crawford, male   DOB: April 15, 1981, 33 y.o.   MRN: [id number]   D: Pt informed the writer that he'd had a "good day". However, pt asked the writer if he was going to be sent to the hosp for his arm. Writer informed pt that she was check into his situation and inform him of the decision. Pt also stated that his neurontin was to be increased yesterday due to pain in his left leg.  A: Pt was given Prn Robaxin for leg pain. Also Clinical research associate contacted the ED and was informed by the EDP Caryn Bee....  To have the pt  Contact Bradly Bienenstock with [facility] at [phone] for out patient follow up.  15 min checks continued for safety.  R: Pt remains safe.

## 2014-02-04 NOTE — Plan of Care (Signed)
Problem: Alteration in mood Goal: LTG-Patient reports reduction in suicidal thoughts (Patient reports reduction in suicidal thoughts and is able to verbalize a safety plan for whenever patient is feeling suicidal)  Outcome: Progressing Pt stated he felt a little better than he did yesterday.  Goal: STG-Patient reports thoughts of self-harm to staff Outcome: Progressing Pt agreed to verbal contract for safety

## 2014-02-04 NOTE — Progress Notes (Signed)
[facility] MD Progress Note  02/04/2014 4:05 PM Devin Crawford  MRN:  [id number]  Subjective: "I'm worried, I got these needles broken up in left arm. I was doing IV drug when it happened. They have been in my arm for 3 weeks now. My detox treatment is going well. I was not doing much drugs any way".  Objective:  Patient is seen and chart is reviewed. Patient continues to endorse ongoing anxiety and excessive worrying about the broken IV needles in his left arm. He says the needles broke while he was doing IV drugs. He is complaining of pain and burning sensation to the cites. Xray report has confirmed the needles being in place to his left arm. Will transfer to the ED for evaluation. Detox treatment in place.   Diagnosis:   DSM5: Schizophrenia Disorders:  NA Obsessive-Compulsive Disorders:  NA Trauma-Stressor Disorders:  NA Substance/Addictive Disorders:  Polysubstance (including opioids) dependence without physiological dependence  Depressive Disorders:  GAD Total Time spent with patient: 35  Axis I: Polysubstance (including opioids) dependence without physiological dependence Axis II: Deferred Axis III:  Past Medical History  Diagnosis Date  . HIV (human immunodeficiency virus infection)   . Blood transfusion 2007  . Arthritis   . Kidney calculus   . Narcotic addiction   . Multiple pelvic fractures   . History of tonsillectomy   . hip fracture left     rod placement 04-2006  . History of spleen injury     removal 2007  . Mental disorder   . Depression    Axis IV: other psychosocial or environmental problems and polysubstance dependence Axis V: 41-50 serious symptoms  ADL's:  Intact  Sleep: Fair  Appetite:  Good  Suicidal Ideation:  Plan:  Denies Intent:  Denies Means:  Denies Homicidal Ideation:  Plan:  Denies Intent:  Denies Means:  Denies AEB (as evidenced by):  Psychiatric Specialty Exam: Physical Exam  Skin:       Review of Systems  Constitutional:  Positive for chills and malaise/fatigue.  HENT: Negative.   Eyes: Negative.   Respiratory: Negative.   Cardiovascular: Negative.   Gastrointestinal: Negative.   Genitourinary: Negative.   Musculoskeletal: Positive for myalgias.  Skin:       Broken IV needles lodged to inner left arm, xray confirmed.  Neurological: Positive for dizziness.  Endo/Heme/Allergies: Negative.   Psychiatric/Behavioral: Positive for depression and substance abuse. Negative for suicidal ideas, hallucinations and memory loss. The patient is nervous/anxious.     Blood pressure 102/56, pulse 75, temperature 98.1 F (36.7 C), temperature source Oral, resp. rate 16, height 5' 8.25" (1.734 m), weight 98.884 kg (218 lb).Body mass index is 32.89 kg/(m^2).  General Appearance: Casual and Fairly Groomed  Patent attorney::  Good  Speech:  Clear and Coherent  Volume:  Normal  Mood:  Anxious and Depressed  Affect:  Non-Congruent  Thought Process:  Coherent  Orientation:  Full (Time, Place, and Person)  Thought Content:  Rumination  Suicidal Thoughts:  No  Homicidal Thoughts:  No  Memory:  Immediate;   Good Recent;   Good Remote;   Good  Judgement:  Fair  Insight:  Fair  Psychomotor Activity:  Restlessness and Worrying  Concentration:  Fair  Recall:  Good  Fund of Knowledge:Poor  Language: Fair  Akathisia:  No  Handed:  Right  AIMS (if indicated):     Assets:  Desire for Improvement  Sleep:  Number of Hours: 6   Musculoskeletal: Strength & Muscle Tone:  within normal limits Gait & Station: normal Patient leans: N/A  Current Medications: Current Facility-Administered Medications  Medication Dose Route Frequency Provider Last Rate Last Dose  . alum & mag hydroxide-simeth (MAALOX/MYLANTA) 200-200-20 MG/5ML suspension 30 mL  30 mL Oral Q4H PRN Mojeed Akintayo   30 mL at 02/03/14 1400  . atenolol (TENORMIN) tablet 50 mg  50 mg Oral Daily Jomarie Longs, MD   50 mg at 02/04/14 0753  . celecoxib (CELEBREX) capsule  200 mg  200 mg Oral BID Jomarie Longs, MD   200 mg at 02/04/14 0753  . clonazePAM (KLONOPIN) tablet 0.25 mg  0.25 mg Oral BID Jomarie Longs, MD   0.25 mg at 02/04/14 0752  . cloNIDine (CATAPRES) tablet 0.1 mg  0.1 mg Oral QID Jomarie Longs, MD   0.1 mg at 02/04/14 1158   Followed by  . [START ON 02/05/2014] cloNIDine (CATAPRES) tablet 0.1 mg  0.1 mg Oral BH-qamhs Jomarie Longs, MD       Followed by  . [START ON 02/08/2014] cloNIDine (CATAPRES) tablet 0.1 mg  0.1 mg Oral QAC breakfast Saramma Eappen, MD      . dicyclomine (BENTYL) tablet 20 mg  20 mg Oral Q6H PRN Jomarie Longs, MD      . doxepin (SINEQUAN) capsule 10 mg  10 mg Oral QHS Jomarie Longs, MD   10 mg at 02/03/14 2143  . DULoxetine (CYMBALTA) DR capsule 60 mg  60 mg Oral Daily Jomarie Longs, MD   60 mg at 02/04/14 0753  . elvitegravir-cobicistat-emtricitabine-tenofovir (STRIBILD) 150-150-200-300 MG tablet 1 tablet  1 tablet Oral Q breakfast Jomarie Longs, MD   1 tablet at 02/04/14 0759  . FLUoxetine (PROZAC) capsule 20 mg  20 mg Oral Daily Jomarie Longs, MD   20 mg at 02/04/14 0753  . gabapentin (NEURONTIN) capsule 800 mg  800 mg Oral TID Kristeen Mans, NP   800 mg at 02/04/14 0752  . gemfibrozil (LOPID) tablet 600 mg  600 mg Oral BID AC Jomarie Longs, MD   600 mg at 02/04/14 1191  . hydrOXYzine (ATARAX/VISTARIL) tablet 50 mg  50 mg Oral Q6H PRN Mojeed Akintayo   50 mg at 02/04/14 1158  . loperamide (IMODIUM) capsule 2-4 mg  2-4 mg Oral PRN Jomarie Longs, MD   4 mg at 02/04/14 0756  . magnesium hydroxide (MILK OF MAGNESIA) suspension 30 mL  30 mL Oral Daily PRN Mojeed Akintayo      . methocarbamol (ROBAXIN) tablet 500 mg  500 mg Oral Q8H PRN Jomarie Longs, MD   500 mg at 02/03/14 2144  . ondansetron (ZOFRAN-ODT) disintegrating tablet 4 mg  4 mg Oral Q6H PRN Jomarie Longs, MD   4 mg at 02/03/14 1107  . pantoprazole (PROTONIX) EC tablet 40 mg  40 mg Oral QAC breakfast Jomarie Longs, MD   40 mg at 02/04/14 4782    Lab Results:  No results found for this or any previous visit (from the past 48 hour(s)).  Physical Findings: AIMS: Facial and Oral Movements Muscles of Facial Expression: None, normal Lips and Perioral Area: None, normal Jaw: None, normal Tongue: None, normal,Extremity Movements Upper (arms, wrists, hands, fingers): None, normal Lower (legs, knees, ankles, toes): None, normal, Trunk Movements Neck, shoulders, hips: None, normal, Overall Severity Severity of abnormal movements (highest score from questions above): None, normal Incapacitation due to abnormal movements: None, normal Patient's awareness of abnormal movements (rate only patient's report): No Awareness, Dental Status Current problems with teeth and/or dentures?: No Does patient  usually wear dentures?: No  CIWA:  CIWA-Ar Total: 0 COWS:  COWS Total Score: 3  Treatment Plan Summary: Daily contact with patient to assess and evaluate symptoms and progress in treatment Medication management  Plan: Continue detox treatment, Duloxetine for depression, Fluoxetine 20 mg for anxiety/depression, gabapentin for substance withdrawal syndrome. Will transfer to the ED for evaluation of the broken needles lodged to left arm.  Medical Decision Making Problem Points:  Established problem, stable/improving (1), Review of last therapy session (1) and Review of psycho-social stressors (1) Data Points:  Review of medication regiment & side effects (2) Review of new medications or change in dosage (2)  I certify that inpatient services furnished can reasonably be expected to improve the patient's condition.   Armandina Stammer I, PMHNP-BC 02/04/2014, 4:05 PM  I agreed with the findings, treatment and disposition plan of this patient. Kathryne Sharper, MD

## 2014-02-04 NOTE — Progress Notes (Signed)
[facility] Group Notes:  (Nursing/MHT/Case Management/Adjunct)  Date:  02/04/2014  Time:  8:47 PM  Type of Therapy:  Psychoeducational Skills  Participation Level:  Active  Participation Quality:  Appropriate  Affect:  Appropriate  Cognitive:  Appropriate  Insight:  Good  Engagement in Group:  Engaged  Modes of Intervention:  Education  Summary of Progress/Problems: The patient expressed in group this evening that he had a good day overall. First of all, he indicated that he had a good visit with his partner. Secondly, he mentioned that he spent some of his time reading a book. As a theme for the day, his coping skill is to listen to music Ladona Ridgel Swift).   CAM, DAUPHIN S 02/04/2014, 8:47 PM

## 2014-02-05 DIAGNOSIS — M25559 Pain in unspecified hip: Secondary | ICD-10-CM

## 2014-02-05 MED ORDER — GABAPENTIN 400 MG PO CAPS
800.0000 mg | ORAL_CAPSULE | Freq: Three times a day (TID) | ORAL | Status: DC
  Administered 2014-02-05 – 2014-02-09 (×16): 800 mg via ORAL
  Filled 2014-02-05 (×24): qty 2

## 2014-02-05 MED ORDER — ACETAMINOPHEN 500 MG PO TABS
1000.0000 mg | ORAL_TABLET | Freq: Four times a day (QID) | ORAL | Status: DC | PRN
  Administered 2014-02-05 – 2014-02-06 (×2): 1000 mg via ORAL
  Filled 2014-02-05 (×2): qty 2

## 2014-02-05 NOTE — Progress Notes (Signed)
Patient ID: Devin Crawford, male   DOB: [birth year], 33 y.o.   MRN: [id number] [facility] MD Progress Note  02/05/2014 2:46 PM Devin Crawford  MRN:  [id number]  Subjective: Devin Crawford reports that he is having increased pain to left hip, says his pain radiates to his lower left leg. He says his moo is improving"  Objective:  Patient is seen and chart is reviewed. Devin Crawford is up & about participating in group sessions. Today, he is complaining of increased pain episodes to his left hip. He says his still feeling pain to the cites whereby he had the broken IV drug needles. According to standing notes from the staff, EDP has instructed for patient to follow-up care with a local orthopedic for the evaluation of the broken needles in his left arm.   Diagnosis:   DSM5: Schizophrenia Disorders:  NA Obsessive-Compulsive Disorders:  NA Trauma-Stressor Disorders:  NA Substance/Addictive Disorders:  Polysubstance (including opioids) dependence without physiological dependence  Depressive Disorders:  GAD Total Time spent with patient: 35  Axis I: Polysubstance (including opioids) dependence without physiological dependence Axis II: Deferred Axis III:  Past Medical History  Diagnosis Date  . HIV (human immunodeficiency virus infection)   . Blood transfusion 2007  . Arthritis   . Kidney calculus   . Narcotic addiction   . Multiple pelvic fractures   . History of tonsillectomy   . hip fracture left     rod placement 04-2006  . History of spleen injury     removal 2007  . Mental disorder   . Depression    Axis IV: other psychosocial or environmental problems and polysubstance dependence Axis V: 41-50 serious symptoms  ADL's:  Intact  Sleep: Fair  Appetite:  Good  Suicidal Ideation:  Plan:  Denies Intent:  Denies Means:  Denies Homicidal Ideation:  Plan:  Denies Intent:  Denies Means:  Denies AEB (as evidenced by):  Psychiatric Specialty Exam: Physical Exam  Skin:       Review  of Systems  Constitutional: Positive for chills and malaise/fatigue.  HENT: Negative.   Eyes: Negative.   Respiratory: Negative.   Cardiovascular: Negative.   Gastrointestinal: Negative.   Genitourinary: Negative.   Musculoskeletal: Positive for myalgias.  Skin:       Broken IV needles lodged to inner left arm, xray confirmed.  Neurological: Positive for dizziness.  Endo/Heme/Allergies: Negative.   Psychiatric/Behavioral: Positive for depression and substance abuse. Negative for suicidal ideas, hallucinations and memory loss. The patient is nervous/anxious.     Blood pressure 103/69, pulse 67, temperature 97.6 F (36.4 C), temperature source Oral, resp. rate 16, height 5' 8.25" (1.734 m), weight 98.884 kg (218 lb).Body mass index is 32.89 kg/(m^2).  General Appearance: Casual and Fairly Groomed  Patent attorney::  Good  Speech:  Clear and Coherent  Volume:  Normal  Mood:  Anxious and Depressed  Affect:  Non-Congruent  Thought Process:  Coherent  Orientation:  Full (Time, Place, and Person)  Thought Content:  Rumination  Suicidal Thoughts:  No  Homicidal Thoughts:  No  Memory:  Immediate;   Good Recent;   Good Remote;   Good  Judgement:  Fair  Insight:  Fair  Psychomotor Activity:  Worrying  Concentration:  Fair  Recall:  Good  Fund of Knowledge:Poor  Language: Fair  Akathisia:  No  Handed:  Right  AIMS (if indicated):     Assets:  Desire for Improvement  Sleep:  Number of Hours: 6.75   Musculoskeletal: Strength & Muscle  Tone: within normal limits Gait & Station: normal Patient leans: N/A  Current Medications: Current Facility-Administered Medications  Medication Dose Route Frequency Provider Last Rate Last Dose  . acetaminophen (TYLENOL) tablet 1,000 mg  1,000 mg Oral Q6H PRN Sanjuana Kava, NP   1,000 mg at 02/05/14 1214  . alum & mag hydroxide-simeth (MAALOX/MYLANTA) 200-200-20 MG/5ML suspension 30 mL  30 mL Oral Q4H PRN Mojeed Akintayo   30 mL at 02/03/14 1400  .  atenolol (TENORMIN) tablet 50 mg  50 mg Oral Daily Jomarie Longs, MD   50 mg at 02/05/14 0809  . celecoxib (CELEBREX) capsule 200 mg  200 mg Oral BID Jomarie Longs, MD   200 mg at 02/05/14 0809  . clonazePAM (KLONOPIN) tablet 0.25 mg  0.25 mg Oral BID Jomarie Longs, MD   0.25 mg at 02/05/14 0809  . cloNIDine (CATAPRES) tablet 0.1 mg  0.1 mg Oral BH-qamhs Jomarie Longs, MD       Followed by  . [START ON 02/08/2014] cloNIDine (CATAPRES) tablet 0.1 mg  0.1 mg Oral QAC breakfast Saramma Eappen, MD      . dicyclomine (BENTYL) tablet 20 mg  20 mg Oral Q6H PRN Jomarie Longs, MD      . doxepin (SINEQUAN) capsule 10 mg  10 mg Oral QHS Jomarie Longs, MD   10 mg at 02/04/14 2105  . DULoxetine (CYMBALTA) DR capsule 60 mg  60 mg Oral Daily Jomarie Longs, MD   60 mg at 02/05/14 0810  . elvitegravir-cobicistat-emtricitabine-tenofovir (STRIBILD) 150-150-200-300 MG tablet 1 tablet  1 tablet Oral Q breakfast Jomarie Longs, MD   1 tablet at 02/05/14 0809  . FLUoxetine (PROZAC) capsule 20 mg  20 mg Oral Daily Jomarie Longs, MD   20 mg at 02/05/14 0809  . gabapentin (NEURONTIN) capsule 800 mg  800 mg Oral TID PC & HS Sanjuana Kava, NP      . gemfibrozil (LOPID) tablet 600 mg  600 mg Oral BID AC Saramma Eappen, MD   600 mg at 02/05/14 0629  . hydrOXYzine (ATARAX/VISTARIL) tablet 50 mg  50 mg Oral Q6H PRN Mojeed Akintayo   50 mg at 02/04/14 1158  . loperamide (IMODIUM) capsule 2-4 mg  2-4 mg Oral PRN Jomarie Longs, MD   4 mg at 02/04/14 0756  . magnesium hydroxide (MILK OF MAGNESIA) suspension 30 mL  30 mL Oral Daily PRN Mojeed Akintayo      . methocarbamol (ROBAXIN) tablet 500 mg  500 mg Oral Q8H PRN Jomarie Longs, MD   500 mg at 02/05/14 0629  . ondansetron (ZOFRAN-ODT) disintegrating tablet 4 mg  4 mg Oral Q6H PRN Jomarie Longs, MD   4 mg at 02/03/14 1107  . pantoprazole (PROTONIX) EC tablet 40 mg  40 mg Oral QAC breakfast Jomarie Longs, MD   40 mg at 02/05/14 1610    Lab Results: No results found for  this or any previous visit (from the past 48 hour(s)).  Physical Findings: AIMS: Facial and Oral Movements Muscles of Facial Expression: None, normal Lips and Perioral Area: None, normal Jaw: None, normal Tongue: None, normal,Extremity Movements Upper (arms, wrists, hands, fingers): None, normal Lower (legs, knees, ankles, toes): None, normal, Trunk Movements Neck, shoulders, hips: None, normal, Overall Severity Severity of abnormal movements (highest score from questions above): None, normal Incapacitation due to abnormal movements: None, normal Patient's awareness of abnormal movements (rate only patient's report): No Awareness, Dental Status Current problems with teeth and/or dentures?: No Does patient usually wear dentures?: No  CIWA:  CIWA-Ar Total: 0 COWS:  COWS Total Score: 1  Treatment Plan Summary: Daily contact with patient to assess and evaluate symptoms and progress in treatment Medication management  Plan: Continue detox treatment, Duloxetine for depression, Fluoxetine 20 mg for anxiety/depression. Increased gabapentin 800 mg qid for pain/substance withdrawal syndrome. EDP instructed for Mr. Marcille Blanco to follow-up with an Orthopedic for the broken needles to his left arm  Medical Decision Making Problem Points:  Established problem, stable/improving (1), Review of last therapy session (1) and Review of psycho-social stressors (1) Data Points:  Review of medication regiment & side effects (2) Review of new medications or change in dosage (2)  I certify that inpatient services furnished can reasonably be expected to improve the patient's condition.   Armandina Stammer I, PMHNP-BC 02/05/2014, 2:46 PM  I agreed with the findings, treatment and disposition plan of this patient. Kathryne Sharper, MD

## 2014-02-05 NOTE — Progress Notes (Signed)
[facility] Group Notes:  (Nursing/MHT/Case Management/Adjunct)  Date:  02/05/2014  Time:  10:06 PM  Type of Therapy:  Psychoeducational Skills  Participation Level:  Active  Participation Quality:  Appropriate  Affect:  Appropriate  Cognitive:  Appropriate  Insight:  Lacking  Engagement in Group:  Engaged  Modes of Intervention:  Education  Summary of Progress/Problems: The patient had a bright affect but had little to share with regards to his day. The patient mentioned that he had a good visit with his partner this evening and that he was feeling better. In terms of the theme for the day, his support system will consist of his partner, mother, and possibly his father.   Carleigh Buccieri, Neythan S 02/05/2014, 10:06 PM

## 2014-02-05 NOTE — [facility] Group Notes (Signed)
[facility] Group Notes:  (Nursing/MHT/Case Management/Adjunct)  Date:  02/05/2014  Time:  11:34 AM  Type of Therapy:  Psychoeducational Skills  Participation Level:  Active  Participation Quality:  Appropriate  Affect:  Appropriate  Cognitive:  Appropriate  Insight:  Appropriate  Engagement in Group:  Engaged  Modes of Intervention:  Discussion  Summary of Progress/Problems: Pt did attend self inventory group.   Jacquelyne Balint Shanta 02/05/2014, 11:34 AM

## 2014-02-05 NOTE — [facility] Group Notes (Signed)
[facility] Group Notes:  (Nursing/MHT/Case Management/Adjunct)  Date:  02/05/2014  Time:  11:34 AM  Type of Therapy:  Psychoeducational Skills  Participation Level:  Active  Participation Quality:  Appropriate  Affect:  Appropriate  Cognitive:  Appropriate  Insight:  Appropriate  Engagement in Group:  Engaged  Modes of Intervention:  Discussion  Summary of Progress/Problems: Pt did attend healthy support systems.   Jacquelyne Balint Shanta 02/05/2014, 11:34 AM

## 2014-02-05 NOTE — Progress Notes (Signed)
Patient ID: Devin Crawford, male   DOB: [birth year], 33 y.o.   MRN: [id number] D. Patient presents with anxious mood, affect congruent again today. [name] reports that he is ''doing better, still having some aches and pains but I'm doing ok.'' Patient has been interactive on the unit today, attending unit programming. He did complete self inventory and reports his depression is improving, rating at 5/10 on depression scale. He denies any SI. A. Medications given as ordered. Support and encouragement provided. Discussed patient progress and concerns with A. Nwoko NP . R. Patient is safe on the unit, in no acute distress at this time. No further voiced concerns at this time. Will continue to monitor q 15 minutes for safety.

## 2014-02-05 NOTE — Progress Notes (Signed)
Patient ID: Devin Crawford, male   DOB: 02/23/1981, 33 y.o.   MRN: [id number] D)  Has been sleeping tonight, eyes closed, resp reg, unlabored, no c/o's voiced.  A)  Will continue to monitor q 15 minutes for safety, continue POC R)  Safety maintained.

## 2014-02-05 NOTE — Progress Notes (Signed)
Patient ID: Devin Crawford, male   DOB: August 19, 1980, 33 y.o.   MRN: [id number] D)  Has been out and about on the hall this evening,  Stated had good day, stated hadn't realized how his behavior had effected his partner's family, trying to resolve issues.  Attended group and participated, has been pleasant and cooperative, interacting appropriately with staff and peers, and has been compliant with meds. A)  Continue to offer support, encourage to think about discharge planning, continue q 15 minute checks for safety, R)  Safety maintained.

## 2014-02-05 NOTE — [facility] Group Notes (Signed)
[facility] Group Notes:  (Clinical Social Work)  02/05/2014   11:15am-12:00pm  Summary of Progress/Problems:  The main focus of today's process group was to listen to a variety of genres of music and to identify that different types of music provoke different responses.  The patient then was able to identify personally what was soothing for them, as well as energizing.  Handouts were used to record feelings evoked, as well as how patient can personally use this knowledge in sleep habits, with depression, and with other symptoms.  The patient expressed understanding of concepts, as well as knowledge of how each type of music affected him and how this can be used at home as a wellness/recovery tool.  Devin Crawford was fully engaged throughout group, and was very aware and articulate about how different musical pieces made him feel.  Type of Therapy:  Music Therapy   Participation Level:  Active  Participation Quality:  Attentive and Sharing  Affect:  Blunted  Cognitive:  Oriented  Insight:  Engaged  Engagement in Therapy:  Engaged  Modes of Intervention:   Activity, Exploration  Ambrose Mantle, LCSW 02/05/2014, 12:30pm

## 2014-02-06 MED ORDER — FLUOXETINE HCL 20 MG PO CAPS
40.0000 mg | ORAL_CAPSULE | Freq: Every day | ORAL | Status: DC
  Administered 2014-02-07 – 2014-02-09 (×3): 40 mg via ORAL
  Filled 2014-02-06: qty 28
  Filled 2014-02-06 (×4): qty 2

## 2014-02-06 MED ORDER — CLONAZEPAM 0.5 MG PO TABS
0.2500 mg | ORAL_TABLET | Freq: Every day | ORAL | Status: DC
  Administered 2014-02-07: 0.25 mg via ORAL
  Filled 2014-02-06: qty 1

## 2014-02-06 MED ORDER — DULOXETINE HCL 30 MG PO CPEP
30.0000 mg | ORAL_CAPSULE | Freq: Every day | ORAL | Status: DC
  Administered 2014-02-07 – 2014-02-09 (×3): 30 mg via ORAL
  Filled 2014-02-06 (×3): qty 1
  Filled 2014-02-06: qty 14
  Filled 2014-02-06: qty 1

## 2014-02-06 MED ORDER — FLUOXETINE HCL 20 MG PO CAPS
20.0000 mg | ORAL_CAPSULE | Freq: Once | ORAL | Status: AC
  Administered 2014-02-06: 20 mg via ORAL
  Filled 2014-02-06: qty 1

## 2014-02-06 NOTE — [facility] Group Notes (Signed)
[facility] LCSW Aftercare Discharge Planning Group Note   02/06/2014 10:14 AM  Participation Quality:  Appropriate   Mood/Affect:  Appropriate  Depression Rating:  4  Anxiety Rating:  6-7  Thoughts of Suicide:  No Will you contract for safety?   Yes  Current AVH:  No  Plan for Discharge/Comments:  Devin Crawford shared that his partner has been visiting him daily and has been telling him more about what happened prior to his admission to [facility]. Devin Crawford stated that he is ashamed and embarrassed by his behavior. He is hoping to d/c Tuesday and wants to return to work immediately (the following day). Pt plans to follow up at [facility] for med management and is requesting appt with [facility] for PCP. Aggie also suggested that pt get appt at this clinic so they can refer him to orthopedic doctor in order to address his medical issues (needle pieces lodged in arm).   Transportation Means: sig other  Supports: sig other/some family supports   Counselling psychologist, Oncologist

## 2014-02-06 NOTE — Progress Notes (Addendum)
Patient ID: Devin Crawford, male   DOB: 1980/06/22, 33 y.o.   MRN: [id number] [facility] MD Progress Note  02/06/2014 11:31 AM EUGINE BUBB  MRN:  [id number]  Subjective: Rankin reports that " I am doing better , I would like to get help with substance abuse ,but needs to go back to work on Wednesday and hence would like to explore my options".  Objective:  Patient is seen and chart is reviewed. Samrat today reports that he has been doing well on his medications and has better and more stable moods. Patient reports sleep as better and appetite as fair. Denies SI/AH/VH/HI.  Patient had xray ,arm done on Friday for possible foreign body when he was abusing Heroin . According to standing notes from the staff, EDP has instructed for patient to follow-up care with a local orthopedic for the evaluation of the broken needles in his left arm.   Per staff patient is compliant on medications. Denies side effects.  Diagnosis:   DSM5:  Primary psychiatric diagnosis:  Major depressive disorder ,recurrent ,severe,without psychotic features   Secondary psychiatric diagnosis:  Panic disorder  Opioid use disorder,severe  Opioid withdrawal (on clonidine)  Cannabis use disorder  Stimulant (cociane ,amphetamine) use disorder  Tobacco use disorder   Non psychiatric diagnosis:  HIV  Chronic pain  Hx of multiple pelvic fracture(2007)  Hx of splenic injury (2007)         Total Time spent with patient: 30 minutes   Past Medical History  Diagnosis Date  . HIV (human immunodeficiency virus infection)   . Blood transfusion 2007  . Arthritis   . Kidney calculus   . Narcotic addiction   . Multiple pelvic fractures   . History of tonsillectomy   . hip fracture left     rod placement 04-2006  . History of spleen injury     removal 2007  . Mental disorder   . Depression      ADL's:  Intact  Sleep: Fair  Appetite:  Good  Suicidal Ideation:  Plan:  Denies Intent:  Denies Means:   Denies Homicidal Ideation:  Plan:  Denies Intent:  Denies Means:  Denies AEB (as evidenced by):  Psychiatric Specialty Exam: Physical Exam  Skin:       Review of Systems  HENT: Negative.   Eyes: Negative.   Respiratory: Negative.   Cardiovascular: Negative.   Gastrointestinal: Negative.   Genitourinary: Negative.   Musculoskeletal: Positive for myalgias.  Skin:       Broken IV needles lodged to inner left arm, xray confirmed.  Neurological: Negative.   Endo/Heme/Allergies: Negative.   Psychiatric/Behavioral: Positive for depression (improving) and substance abuse. Negative for suicidal ideas, hallucinations and memory loss. The patient is nervous/anxious.     Blood pressure 105/70, pulse 62, temperature 97.7 F (36.5 C), temperature source Oral, resp. rate 16, height 5' 8.25" (1.734 m), weight 98.884 kg (218 lb).Body mass index is 32.89 kg/(m^2).  General Appearance: Casual and Fairly Groomed  Patent attorney::  Good  Speech:  Clear and Coherent  Volume:  Normal  Mood:  Anxious and Depressed improving  Affect:  Non-Congruent  Thought Process:  Coherent  Orientation:  Full (Time, Place, and Person)  Thought Content:  Rumination  Suicidal Thoughts:  No  Homicidal Thoughts:  No  Memory:  Immediate;   Good Recent;   Good Remote;   Good  Judgement:  Fair  Insight:  Fair  Psychomotor Activity:  Worrying  Concentration:  Fair  Recall:  Good  Fund of Knowledge:Poor  Language: Fair  Akathisia:  No  Handed:  Right  AIMS (if indicated):     Assets:  Desire for Improvement  Sleep:  Number of Hours: 6.5   Musculoskeletal: Strength & Muscle Tone: within normal limits Gait & Station: normal Patient leans: N/A  Current Medications: Current Facility-Administered Medications  Medication Dose Route Frequency Provider Last Rate Last Dose  . acetaminophen (TYLENOL) tablet 1,000 mg  1,000 mg Oral Q6H PRN Sanjuana Kava, NP   1,000 mg at 02/05/14 1214  . alum & mag  hydroxide-simeth (MAALOX/MYLANTA) 200-200-20 MG/5ML suspension 30 mL  30 mL Oral Q4H PRN Mojeed Akintayo   30 mL at 02/03/14 1400  . atenolol (TENORMIN) tablet 50 mg  50 mg Oral Daily Jomarie Longs, MD   50 mg at 02/06/14 0805  . celecoxib (CELEBREX) capsule 200 mg  200 mg Oral BID Jomarie Longs, MD   200 mg at 02/06/14 0805  . clonazePAM (KLONOPIN) tablet 0.25 mg  0.25 mg Oral BID Jomarie Longs, MD   0.25 mg at 02/06/14 0804  . cloNIDine (CATAPRES) tablet 0.1 mg  0.1 mg Oral BH-qamhs Altheria Shadoan, MD   0.1 mg at 02/06/14 0805   Followed by  . [START ON 02/08/2014] cloNIDine (CATAPRES) tablet 0.1 mg  0.1 mg Oral QAC breakfast Mose Colaizzi, MD      . dicyclomine (BENTYL) tablet 20 mg  20 mg Oral Q6H PRN Jomarie Longs, MD      . doxepin (SINEQUAN) capsule 10 mg  10 mg Oral QHS Jomarie Longs, MD   10 mg at 02/05/14 2100  . DULoxetine (CYMBALTA) DR capsule 60 mg  60 mg Oral Daily Jomarie Longs, MD   60 mg at 02/06/14 0805  . elvitegravir-cobicistat-emtricitabine-tenofovir (STRIBILD) 150-150-200-300 MG tablet 1 tablet  1 tablet Oral Q breakfast Jomarie Longs, MD   1 tablet at 02/06/14 0805  . FLUoxetine (PROZAC) capsule 20 mg  20 mg Oral Daily Jomarie Longs, MD   20 mg at 02/06/14 0805  . gabapentin (NEURONTIN) capsule 800 mg  800 mg Oral TID PC & HS Sanjuana Kava, NP   800 mg at 02/06/14 0804  . gemfibrozil (LOPID) tablet 600 mg  600 mg Oral BID AC Vashti Bolanos, MD   600 mg at 02/06/14 0619  . hydrOXYzine (ATARAX/VISTARIL) tablet 50 mg  50 mg Oral Q6H PRN Mojeed Akintayo   50 mg at 02/04/14 1158  . loperamide (IMODIUM) capsule 2-4 mg  2-4 mg Oral PRN Jomarie Longs, MD   4 mg at 02/04/14 0756  . magnesium hydroxide (MILK OF MAGNESIA) suspension 30 mL  30 mL Oral Daily PRN Mojeed Akintayo      . methocarbamol (ROBAXIN) tablet 500 mg  500 mg Oral Q8H PRN Jomarie Longs, MD   500 mg at 02/06/14 0620  . ondansetron (ZOFRAN-ODT) disintegrating tablet 4 mg  4 mg Oral Q6H PRN Jomarie Longs, MD    4 mg at 02/03/14 1107  . pantoprazole (PROTONIX) EC tablet 40 mg  40 mg Oral QAC breakfast Jomarie Longs, MD   40 mg at 02/06/14 0620    Lab Results: No results found for this or any previous visit (from the past 48 hour(s)).  Physical Findings: AIMS: Facial and Oral Movements Muscles of Facial Expression: None, normal Lips and Perioral Area: None, normal Jaw: None, normal Tongue: None, normal,Extremity Movements Upper (arms, wrists, hands, fingers): None, normal Lower (legs, knees, ankles, toes): None, normal, Trunk Movements Neck, shoulders, hips:  None, normal, Overall Severity Severity of abnormal movements (highest score from questions above): None, normal Incapacitation due to abnormal movements: None, normal Patient's awareness of abnormal movements (rate only patient's report): No Awareness, Dental Status Current problems with teeth and/or dentures?: No Does patient usually wear dentures?: No  CIWA:  CIWA-Ar Total: 0 COWS:  COWS Total Score: 0  Treatment Plan Summary: Daily contact with patient to assess and evaluate symptoms and progress in treatment Medication management  Plan:  Will increase Prozac to 40 mg po daily for affective symptoms. Will reduce cymbalta 30 mg today. Could DC ,once he is stable on Prozac.  Will continue Doxepin 10 mg po qhs for sleep.  Will reduce Klonopin po , and will slowly taper off once stable on Prozac.  Will continue Gabapentin at 800 mg ,his current dose for chronic pain.  Will continue clonidine protocol as well as place him on withdrawal precautions.  Will continue his pain medications as scheduled.  Will restart his antiretroviral medications as per his home dosage.  Patient is motivated to get help with his substance abuse issues.  Patient to be referred for substance abuse program once stable.   Patient had Xray arm done on 02/03/14 for possible foreign body in his arm - Xray shows FB positive. Patient was advised by EDP to follow up  with outpatient Orthopedics. Consulted Hospitalist regarding this and per them - patient to be referred to outpatient orthopedics - Dr.Fred Melvyn Novas - at [facility].     Medical Decision Making Problem Points:  Established problem, stable/improving (1), Review of last therapy session (1) and Review of psycho-social stressors (1) Data Points:  Review of medication regiment & side effects (2) Review of new medications or change in dosage (2)  I certify that inpatient services furnished can reasonably be expected to improve the patient's condition.   Javarus Dorner,  02/06/2014, 11:31 AM

## 2014-02-06 NOTE — Progress Notes (Signed)
Adult Psychoeducational Group Note  Date:  02/06/2014 Time:  11:20 PM  Group Topic/Focus:  Wrap-Up Group:   The focus of this group is to help patients review their daily goal of treatment and discuss progress on daily workbooks.  Participation Level:  Minimal  Participation Quality:  Appropriate  Affect:  Appropriate  Cognitive:  Appropriate  Insight: Limited  Engagement in Group:  Limited  Modes of Intervention:  Socialization and Support  Additional Comments:  Patient attended and participated in group tonight. He reports having a good day. He went to his groups, took his medication, spoke with his mother and his partner visited with him today. He spoke with his partner today about going to rehab and he is supportive.   Lita Mains [facility] 02/06/2014, 11:20 PM

## 2014-02-06 NOTE — Tx Team (Signed)
  Interdisciplinary Treatment Plan Update   Date Reviewed:  02/06/2014  Time Reviewed:  11:47 AM  Progress in Treatment:   Attending groups: Yes Participating in groups: Yes Taking medication as prescribed: Yes  Tolerating medication: Yes Family/Significant other contact made: Yes  Patient understands diagnosis: Yes  Discussing patient identified problems/goals with staff: Yes  See initial care plan Medical problems stabilized or resolved: Yes Denies suicidal/homicidal ideation: Yes  In tx team Patient has not harmed self or others: Yes  For review of initial/current patient goals, please see plan of care.  Estimated Length of Stay:  Likely d/c tomorrow  Reason for Continuation of Hospitalization:   New Problems/Goals identified:  N/A  Discharge Plan or Barriers:   return home, follow up outpt  Additional Comments:  Attendees:  Signature: Ivin Booty, MD 02/06/2014 11:47 AM   Signature: Richelle Ito, LCSW 02/06/2014 11:47 AM  Signature: Fransisca Kaufmann, NP 02/06/2014 11:47 AM  Signature: Joslyn Devon, RN 02/06/2014 11:47 AM  Signature: Liborio Nixon, RN 02/06/2014 11:47 AM  Signature:  02/06/2014 11:47 AM  Signature:   02/06/2014 11:47 AM  Signature:    Signature:    Signature:    Signature:    Signature:    Signature:      Scribe for Treatment Team:   Richelle Ito, LCSW  02/06/2014 11:47 AM

## 2014-02-06 NOTE — [facility] Group Notes (Signed)
[facility] LCSW Group Therapy  02/06/2014 1:15 pm  Type of Therapy: Process Group Therapy  Participation Level:  Active  Participation Quality:  Appropriate  Affect:  Flat  Cognitive:  Oriented  Insight:  Improving  Engagement in Group:  Limited  Engagement in Therapy:  Limited  Modes of Intervention:  Activity, Clarification, Education, Problem-solving and Support  Summary of Progress/Problems: Today's group addressed the issue of overcoming obstacles.  Patients were asked to identify their biggest obstacle post d/c that stands in the way of their on-going success, and then problem solve as to how to manage this.  Devin Crawford was engaged throughout.  He talked about forgiving his father as an obstacle he has already overcome, and attributed his ability to do this to maturity and an ability to separate the man from his illness.  He also talked about the damage he did to the relationship between he and the family of his partner.  He admitted to saying some regrettable things to and about them when he was under the influence of drugs prior to admission.  He was remorseful and willing to take responsibility, and others gave him positive feedback for this.  In fact, he got a lot of feedback, some relevant and some not, but he listened attentively and respectfully to everyone.  [name] 02/06/2014   3:42 PM

## 2014-02-06 NOTE — Progress Notes (Signed)
Patient ID: Devin Crawford, male   DOB: [birth year], 33 y.o.   MRN: [id number]  D: Pt informed the writer that he spoke to his partner who told him he "should do the [facility] thing". Pt stated that initially he wasn't sure if he would go to [facility], but that after speaking with his partner he plans to do the 28 day program as suggested by his counselor.  A:  Support and encouragement was offered. 15 min checks continued for safety.  R: Pt remains safe.  [name]

## 2014-02-06 NOTE — Progress Notes (Signed)
D: Pt presents with flat affect and depressed mood. Pt has minimal interaction on the unit. Pt reports depression 4/10. Pt denies SI/HI/AVH. Pt c/o of chronic hip pain this morning and afternoon. Pt given prn meds as ordered per MD. MD made aware of pt complaint. Pt offered heat packs. Pt reported hot flashes but denies any other withdrawal symptoms. Pt compliant with taking meds and attending groups.  A: Medications administered as ordered per MD. Pt made aware of med changes. Verbal support given. Pt encouraged to attend groups. 15 minute checks performed for safety.  R: Pt receptive to treatment. Pt safety maintained at this time.

## 2014-02-07 MED ORDER — CLONAZEPAM 0.5 MG PO TABS
0.2500 mg | ORAL_TABLET | Freq: Every day | ORAL | Status: DC | PRN
  Administered 2014-02-07 – 2014-02-08 (×2): 0.25 mg via ORAL
  Filled 2014-02-07 (×2): qty 1

## 2014-02-07 NOTE — Clinical Social Work Note (Signed)
CSW met with pt individually to discuss his interest in rehab. Pt stated that he now wants referral to [facility]. CSW informed pt that we could do referral but could not guarentee direct admission from [facility]. Pt stated that he could return home with his partner until admitted. CSW submitted referral this morning. Intake person stated that Merry Proud will call this afternoon to complete screening. CSW to followup this afternoon.  [place], [place] 02/07/2014 11:57 AM

## 2014-02-07 NOTE — [facility] Group Notes (Signed)
[facility] LCSW Group Therapy  02/07/2014 , 1:25 PM   Type of Therapy:  Group Therapy  Participation Level:  Active  Participation Quality:  Attentive  Affect:  Appropriate  Cognitive:  Alert  Insight:  Improving  Engagement in Therapy:  Engaged  Modes of Intervention:  Discussion, Exploration and Socialization  Summary of Progress/Problems: Today's group focused on the term Diagnosis.  Participants were asked to define the term, and then pronounce whether it is a negative, positive or neutral term.  Devin Crawford talked about accepting his diagnosis of substance abuse fairly easily, but admitted that he has still not accepted his HIV diagnosis, even though it was initially given 10 years ago.  Stated he is fearful of what others say and how they feel about him, and subsequently treat him.  Group was very supportive around this.  Led to an interesting discussion about stigma, and how education is one way to combat it.  Devin Crawford B 02/07/2014 , 1:25 PM

## 2014-02-07 NOTE — Progress Notes (Signed)
D: Pt presents with flat affect and depressed mood. Pt rates depression 5/10. Pt reports anxiety 8/10.  Pt denies SI/HI. No withdrawal symptoms noted. Pt compliant with taking meds and attending groups. Pt voiced concerns about having his CD4 level checked. MD made aware of pts request. Pt continues to c/o of left leg/hip pain. A: Medications administered as ordered per MD. Verbal support given. Pt encouraged to attend groups. MD ordered CD4 blood draw as requested by pt. 15 minute checks performed for safety. R: Pt safety maintained. Pt receptive to treatment.
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
## 2014-02-07 NOTE — Tx Team (Signed)
  Interdisciplinary Treatment Plan Update   Date Reviewed:  02/07/2014  Time Reviewed:  2:05 PM  Progress in Treatment:   Attending groups: Yes Participating in groups: Yes Taking medication as prescribed: Yes  Tolerating medication: Yes Family/Significant other contact made: Yes  Patient understands diagnosis: Yes  Discussing patient identified problems/goals with staff: Yes  See initial care plan Medical problems stabilized or resolved: Yes Denies suicidal/homicidal ideation: Yes  In tx team Patient has not harmed self or others: Yes  For review of initial/current patient goals, please see plan of care.  Estimated Length of Stay:  1-2 days  Reason for Continuation of Hospitalization: Depression Medication stabilization  New Problems/Goals identified:  N/A  Discharge Plan or Barriers:   CSW is referring to [facility] rehab.  Pt agrees that going home first, and then to rehab when bed becomes available, is an acceptable plan.  Additional Comments:  " I am doing better , my partner was here yesterday and we had a discussion. I have decided to go to an inpatient facility for my substance abuse care.   Devin Crawford today reports that he has been doing well on his medications. He is more motivated to go to a substance abuse facility and get inpatient care and reports having better insight in to his drug use issues. Patient reports sleep as better and appetite as fair. Denies SI/AH/VH/HI.    Attendees:  Signature: Ivin Booty, MD 02/07/2014 2:05 PM   Signature: Richelle Ito, LCSW 02/07/2014 2:05 PM  Signature: Fransisca Kaufmann, NP 02/07/2014 2:05 PM  Signature: Joslyn Devon, RN 02/07/2014 2:05 PM  Signature: Liborio Nixon, RN 02/07/2014 2:05 PM  Signature:  02/07/2014 2:05 PM  Signature:   02/07/2014 2:05 PM  Signature:    Signature:    Signature:    Signature:    Signature:    Signature:      Scribe for Treatment Team:   Richelle Ito, LCSW  02/07/2014 2:05 PM

## 2014-02-07 NOTE — Progress Notes (Signed)
Adult Psychoeducational Group Note  Date:  02/07/2014 Time:  9:59 PM  Group Topic/Focus:  Wrap-Up Group:   The focus of this group is to help patients review their daily goal of treatment and discuss progress on daily workbooks.  Participation Level:  Active  Participation Quality:  Appropriate  Affect:  Appropriate  Cognitive:  Appropriate  Insight: Appropriate  Engagement in Group:  Engaged  Modes of Intervention:  Socialization and Support  Additional Comments:  Patient attended and participated in group tonight. He reports having a good day. His partner visited and brought his ring back to him. He advised that today was telling. He open up a lot in group today. Overall he had a good day.   Lita Mains [facility] 02/07/2014, 9:59 PM

## 2014-02-07 NOTE — [facility] Suicide Risk Assessment (Signed)
[facility] INPATIENT:  Family/Significant Other Suicide Prevention Education  Suicide Prevention Education:  Education Completed; Adela Glimpse, mother, [phone] has been identified by the patient as the family member/significant other with whom the patient will be residing, and identified as the person(s) who will aid the patient in the event of a mental health crisis (suicidal ideations/suicide attempt).  With written consent from the patient, the family member/significant other has been provided the following suicide prevention education, prior to the and/or following the discharge of the patient.  The suicide prevention education provided includes the following:  Suicide risk factors  Suicide prevention and interventions  National Suicide Hotline telephone number  [facility] assessment telephone number  [facility] Emergency Assistance 911  [facility] and/or Residential Mobile Crisis Unit telephone number  Request made of family/significant other to:  Remove weapons (e.g., guns, rifles, knives), all items previously/currently identified as safety concern.    Remove drugs/medications (over-the-counter, prescriptions, illicit drugs), all items previously/currently identified as a safety concern.  The family member/significant other verbalizes understanding of the suicide prevention education information provided.  The family member/significant other agrees to remove the items of safety concern listed above.  Daryel Gerald B 02/07/2014, 3:47 PM

## 2014-02-08 LAB — T-HELPER CELLS (CD4) COUNT (NOT AT ARMC)
CD4 T CELL HELPER: 20 % — AB (ref 33–55)
CD4 T Cell Abs: 1250 /uL (ref 400–2700)

## 2014-02-08 MED ORDER — ACETAMINOPHEN 325 MG PO TABS
650.0000 mg | ORAL_TABLET | Freq: Four times a day (QID) | ORAL | Status: DC | PRN
  Administered 2014-02-08 – 2014-02-09 (×3): 650 mg via ORAL
  Filled 2014-02-08 (×3): qty 2

## 2014-02-08 MED ORDER — METAXALONE 800 MG PO TABS
400.0000 mg | ORAL_TABLET | Freq: Three times a day (TID) | ORAL | Status: DC | PRN
  Administered 2014-02-08 – 2014-02-09 (×4): 400 mg via ORAL
  Filled 2014-02-08: qty 6
  Filled 2014-02-08 (×4): qty 1

## 2014-02-08 MED ORDER — LIDOCAINE 5 % EX PTCH
2.0000 | MEDICATED_PATCH | CUTANEOUS | Status: DC
  Administered 2014-02-09: 2 via TRANSDERMAL
  Filled 2014-02-08 (×4): qty 2

## 2014-02-08 MED ORDER — IBUPROFEN 600 MG PO TABS
600.0000 mg | ORAL_TABLET | Freq: Three times a day (TID) | ORAL | Status: DC
  Filled 2014-02-08: qty 1

## 2014-02-08 MED ORDER — CYCLOBENZAPRINE HCL 5 MG PO TABS: 5.0000 mg | ORAL_TABLET | Freq: Three times a day (TID) | ORAL | Status: DC

## 2014-02-08 MED ORDER — DICLOFENAC SODIUM 1 % TD GEL
4.0000 g | Freq: Three times a day (TID) | TRANSDERMAL | Status: DC | PRN
  Filled 2014-02-08: qty 100

## 2014-02-08 NOTE — Progress Notes (Signed)
Patient ID: Devin Crawford, male   DOB: 08-05-1980, 33 y.o.   MRN: [id number]   D: Pt informed the writer that his admission to [facility], "didn't work out". Stated, "my diagnosis is major depression instead of substance abuse."  After acknowledging pt's disappointment pt stated, "i know and I quit my job for that". Explained that he informed to his supervisor that he needed 28 days off and was informed by his supervisor that he would no longer have a job.Explained that he is now trying to get into [employer].  A:  Support and encouragement was offered. 15 min checks continued for safety.  R: Pt remains safe.

## 2014-02-08 NOTE — [facility] Group Notes (Signed)
[facility] LCSW Group Therapy  02/08/2014 2:50 PM  Type of Therapy:  Group Therapy  Participation Level:  Active  Participation Quality:  Attentive  Affect:  Appropriate  Cognitive:  Alert and Oriented  Insight:  Improving  Engagement in Therapy:  Engaged  Modes of Intervention:  Confrontation, Discussion, Education, Exploration, Limit-setting, Problem-solving, Rapport Building, Socialization and Support  Summary of Progress/Problems: Emotion Regulation: This group focused on both positive and negative emotion identification and allowed group members to process ways to identify feelings, regulate negative emotions, and find healthy ways to manage internal/external emotions. Group members were asked to reflect on a time when their reaction to an emotion led to a negative outcome and explored how alternative responses using emotion regulation would have benefited them. Group members were also asked to discuss a time when emotion regulation was utilized when a negative emotion was experienced. Devin Crawford was attentive and engaged during today's processing group. He shows progress in the group setting and improving insight AEB his ability to identify an emotion that he struggles with regulating "frustration." Devin Crawford was further able to describe how frustration feels to him both physically and mentally. "I feel anxious, sad, and angry toward myself." Devin Crawford shared a time when he turned to suicide when overwhelmed with a negative emotion and explained that at the time, he did not think his depression and angst would go away. Devin Crawford shows improving insight AEB his ability to process how all emotions are transient and how making a permanent decision for a temporary emotion was unhealthy and potentially fatal.   Smart, Vernice Bowker LCSWA 02/08/2014, 2:50 PM

## 2014-02-08 NOTE — Progress Notes (Signed)
Patient ID: Devin Crawford, male   DOB: 19-Aug-1980, 33 y.o.   MRN: [id number]  D: Pt. Denies SI/HI and A/V Hallucinations. Patient reports pain and received PRN medication for this complaint. Patient seems to be preoccupied with his pain medications. Patient rates his depression 5-6/10, hopelessness 4-5/10 and anxiety 10/10 for the day. Patient received PRN Vistaril for anxiety and reported its effectiveness upon reassessment.   A: Support and encouragement provided to the patient to remain calm and. Medications administered to patient per physician's orders.  R: Patient is receptive and cooperative with Clinical research associate but can be medication seeking at times with pain medication. Patient is seen in the milieu and is attending some groups. Q15 minute checks are maintained for safety.

## 2014-02-08 NOTE — Plan of Care (Signed)
Problem: Alteration in mood; excessive anxiety as evidenced by: Goal: STG-Patient can identify triggers for anxiety Outcome: Progressing Patient was able to identify that the reason he was anxious earlier today was because he felt like he was not being heard by MD. Patient was able to reevaluate and reported feeling better after his anxiety was under control and apologized.

## 2014-02-08 NOTE — [facility] Group Notes (Signed)
[facility] LCSW Aftercare Discharge Planning Group Note   02/08/2014 10:10 AM  Participation Quality:  Active   Mood/Affect:  Irritable  Depression Rating:  6  Anxiety Rating:  10  Thoughts of Suicide:  No Will you contract for safety?   NA  Current AVH:  No  Plan for Discharge/Comments:  Pt will be discharged tomorrow. He expressed concerns about returning home, stating he will relapse if he does not directly go to [facility] after being discharged.  He also expressed concerns about pain in his left leg. He is requesting medication to relieve his pain. He became very irritable and left group.   Transportation Means: Partner   Supports: Family, Partner  Hyatt,Candace

## 2014-02-08 NOTE — Progress Notes (Signed)
   D: Pt informed the writer that he was told to have outpatient follow up for the embedded needles in his left forearm. Pt states he's not sure if how he'll get it done because at this time he doesn't have insurance.  Pt also states that he prefers to be discharged from [facility] and go immediately to [facility]. Stated, "if I go home I'll think I don't need this". Pt also complained of left leg pain asking if there was something "stronger than flexeril and ultram".   A:  Support and encouragement was offered. 15 min checks continued for safety.  R: Pt remains safe.
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
## 2014-02-09 MED ORDER — GABAPENTIN 400 MG PO CAPS: 800.0000 mg | ORAL_CAPSULE | Freq: Three times a day (TID) | ORAL | Status: DC

## 2014-02-09 MED ORDER — OMEPRAZOLE 40 MG PO CPDR: 40.0000 mg | DELAYED_RELEASE_CAPSULE | Freq: Every day | ORAL | Status: DC

## 2014-02-09 MED ORDER — CELECOXIB 200 MG PO CAPS: 200.0000 mg | ORAL_CAPSULE | Freq: Two times a day (BID) | ORAL | Status: AC

## 2014-02-09 MED ORDER — ELVITEG-COBIC-EMTRICIT-TENOFDF 150-150-200-300 MG PO TABS: 1.0000 | ORAL_TABLET | Freq: Every day | ORAL | Status: DC

## 2014-02-09 MED ORDER — GABAPENTIN 800 MG PO TABS
800.0000 mg | ORAL_TABLET | Freq: Four times a day (QID) | ORAL | Status: DC
  Filled 2014-02-09 (×4): qty 30

## 2014-02-09 MED ORDER — MULTI-VITAMIN/MINERALS PO TABS
1.0000 | ORAL_TABLET | Freq: Every day | ORAL | Status: DC
Start: 2014-02-09 — End: 2014-03-25

## 2014-02-09 MED ORDER — DOXEPIN HCL 10 MG PO CAPS: 10.0000 mg | ORAL_CAPSULE | Freq: Every day | ORAL | Status: DC

## 2014-02-09 MED ORDER — FLUOXETINE HCL 40 MG PO CAPS: 40.0000 mg | ORAL_CAPSULE | Freq: Every day | ORAL | Status: DC

## 2014-02-09 MED ORDER — METAXALONE 400 MG PO TABS: 400.0000 mg | ORAL_TABLET | Freq: Three times a day (TID) | ORAL | Status: DC | PRN

## 2014-02-09 MED ORDER — MULTI-VITAMIN/MINERALS PO TABS: 1.0000 | ORAL_TABLET | Freq: Every day | ORAL | Status: DC

## 2014-02-09 MED ORDER — ATENOLOL 50 MG PO TABS: 50.0000 mg | ORAL_TABLET | Freq: Every day | ORAL | Status: AC

## 2014-02-09 MED ORDER — GEMFIBROZIL 600 MG PO TABS: 600.0000 mg | ORAL_TABLET | Freq: Two times a day (BID) | ORAL | Status: AC

## 2014-02-09 MED ORDER — ATENOLOL 50 MG PO TABS: 50.0000 mg | ORAL_TABLET | Freq: Every day | ORAL | Status: DC

## 2014-02-09 MED ORDER — HYDROXYZINE HCL 50 MG PO TABS: 50.0000 mg | ORAL_TABLET | Freq: Four times a day (QID) | ORAL | Status: DC | PRN

## 2014-02-09 MED ORDER — DULOXETINE HCL 30 MG PO CPEP: 30.0000 mg | ORAL_CAPSULE | Freq: Every day | ORAL | Status: DC

## 2014-02-09 MED ORDER — GEMFIBROZIL 600 MG PO TABS: 600.0000 mg | ORAL_TABLET | Freq: Two times a day (BID) | ORAL | Status: DC

## 2014-02-09 NOTE — Progress Notes (Signed)
Patient ID: Devin Crawford, male   DOB: 1981-02-18, 33 y.o.   MRN: [id number]  Pt. Denies SI/HI and A/V hallucinations. Patient reports that he slept poor last night due to his roommate but reported a normal energy level. Belongings returned to patient at time of discharge. Patient denies any new onset of pain or discomfort. Discharge instructions and medications were reviewed with patient. Patient verbalized understanding of both medications and discharge instructions. Q15 minute safety checks maintained until discharge. No distress noted upon discharge.

## 2014-02-09 NOTE — Clinical Social Work Note (Signed)
Pt declined admission at [facility] due to primary diagnosis of mental illness. Pt referral sent to [facility] yesterday afternoon. CSW left message for Melissa (intake coordinator) at [facility] this morning to check status of referral. Pt scheduled for d/c today and plans to live with his sig other until admission. Pt aware that he must call [facility] daily around 9am to check waiting list. He plans to follow up at [facility] for med management and was given Mental Health Association info and NA list for [facility].   [employer], LCSWA  02/09/2014 9:45 AM

## 2014-02-09 NOTE — Progress Notes (Signed)
[facility] Adult Case Management Discharge Plan :  Will you be returning to the same living situation after discharge: Yes,  home  At discharge, do you have transportation home?:Yes,  mother or boyfriend Do you have the ability to pay for your medications:Yes,  mental health  Release of information consent forms completed and submitted to Medical Records by CSW. Patient to Follow up at: Follow-up Information   Follow up with [facility] for Infectious Disease On 04/06/2014. (Appt. with Dr. Drue Second at 10:45AM on this date. )    Contact information:   [address] [place], [place] [address] Phone: [phone] Fax: [phone]      Follow up with [facility] On 02/27/2014. (Appt with Shelly B. for med managment/assessment for services at 1:00PM.)    Contact information:   [address]   [place]  [336] [phone]      Follow up with [facility] On 02/13/2014. (Appt for hospital follow-up at 9:00AM. Make sure to bring photo ID and After-Visit Summary to this appt. )    Contact information:   [address] [place], [place] [address] Phone: [phone] Fax: [phone]      Follow up with [facility]. (referral sent: 02/08/14 3:30PM. No bed available today. Call Melissa at [facility] daily to check bed availability [phone]))    Contact information:   [address] [place], [place] [address] Phone: [phone] Fax: [phone]      Patient denies SI/HI:   Yes,  during group/self report.    Safety Planning and Suicide Prevention discussed:  Yes,  SPE completed with pt's mother. SPI pamphlet provided to pt and he was encouraged to share information with support network, ask questions, and talk about any concerns relating to SPE.  Smart, Bert Givans LCSWA 02/09/2014, 10:01 AM
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
## 2014-02-09 NOTE — [facility] Group Notes (Signed)
[facility] Group Notes:  (Nursing/MHT/Case Management/Adjunct)  Date:  02/09/2014  Time:  10:26 AM  Type of Therapy:  Nurse Education  Participation Level:  Active  Participation Quality:  Appropriate and Attentive  Affect:  Appropriate  Cognitive:  Appropriate  Insight:  Improving  Engagement in Group:  Engaged  Modes of Intervention:  Discussion  Summary of Progress/Problems: The purpose of this group is to speak about leisure and lifestyle change. Writer speaks about how nutrition, activity, and self care. Patient was attentive and was interested about Mental Health Association.  Ned Clines, Aprel Egelhoff E 02/09/2014, 10:26 AM

## 2014-02-09 NOTE — [facility] Suicide Risk Assessment (Signed)
Demographic Factors:  Caucasian  Total Time spent with patient: 20 minutes  Psychiatric Specialty Exam: Physical Exam  Constitutional: He is oriented to person, place, and time. He appears well-developed and well-nourished.  HENT:  Head: Normocephalic and atraumatic.  Neck: Normal range of motion. Neck supple.  Cardiovascular: Normal rate and regular rhythm.   Respiratory: Effort normal.  GI: Soft.  Musculoskeletal: Normal range of motion.  Neurological: He is alert and oriented to person, place, and time.  Skin: Skin is warm.  Psychiatric: Judgment normal. His mood appears anxious. His affect is labile. His speech is delayed. He is withdrawn. Thought content is paranoid. Cognition and memory are normal. He exhibits a depressed mood.    Review of Systems  Constitutional: Negative.   HENT: Negative.   Eyes: Negative.   Respiratory: Negative.   Cardiovascular: Negative.   Gastrointestinal: Negative.   Genitourinary: Negative.   Musculoskeletal: Negative.   Skin: Negative.   Neurological: Negative.   Psychiatric/Behavioral: Negative for suicidal ideas and substance abuse. The patient is not nervous/anxious.     Blood pressure 113/76, pulse 69, temperature 98.6 F (37 C), temperature source Oral, resp. rate 20, height 5' 8.25" (1.734 m), weight 98.884 kg (218 lb).Body mass index is 32.89 kg/(m^2).  General Appearance: Casual  Eye Contact::  Good  Speech:  Clear and Coherent  Volume:  Normal  Mood:  Euthymic  Affect:  Appropriate  Thought Process:  Coherent  Orientation:  Full (Time, Place, and Person)  Thought Content:  WDL  Suicidal Thoughts:  No  Homicidal Thoughts:  No  Memory:  Immediate;   Good Recent;   Good Remote;   Good  Judgement:  Fair  Insight:  Fair  Psychomotor Activity:  Normal  Concentration:  Fair  Recall:  Fair  Fund of Knowledge:Good  Language: Good  Akathisia:  No    AIMS (if indicated):     Assets:  Communication Skills Desire for  Improvement Housing Social Support  Sleep:  Number of Hours: 6    Musculoskeletal: Strength & Muscle Tone: within normal limits Gait & Station: normal Patient leans: N/A   Mental Status Per Nursing Assessment::   On Admission:     Current Mental Status by Physician: Denies SI/HI/AH/VH , patient is motivated to get help with his substance abuse problems. His partner is very supportive.  Loss Factors: Legal issues  Historical Factors: Prior suicide attempts and Impulsivity  Risk Reduction Factors:   Religious beliefs about death, Positive social support and Positive therapeutic relationship  Continued Clinical Symptoms:  Alcohol/Substance Abuse/Dependencies  Cognitive Features That Contribute To Risk:  Polarized thinking    Suicide Risk:  Minimal: No identifiable suicidal ideation.  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 HIV (human immunodeficiency virus infection)   . Blood transfusion 2007  . Arthritis   . Kidney calculus   . Narcotic addiction   . Multiple pelvic fractures   . History of tonsillectomy   .  hip fracture left     rod placement 04-2006  . History of spleen injury     removal 2007  . Mental disorder   . Depression     Plan Of Care/Follow-up recommendations:  Activity:  no restrictions  Is patient on multiple antipsychotic  therapies at discharge:  No   Has Patient had three or more failed trials of antipsychotic monotherapy by history:  No  Recommended Plan for Multiple Antipsychotic Therapies: NA    Marrisa Kimber 02/09/2014, 10:53 AM

## 2014-02-09 NOTE — Tx Team (Signed)
  Interdisciplinary Treatment Plan Update   Date Reviewed:  02/09/2014  Time Reviewed:  9:46 AM  Progress in Treatment:   Attending groups: Yes Participating in groups: Yes Taking medication as prescribed: Yes  Tolerating medication: Yes Family/Significant other contact made: Yes, SPE completed with pt's mother.  Patient understands diagnosis: Yes  Discussing patient identified problems/goals with staff: Yes  See initial care plan Medical problems stabilized or resolved: Yes Denies suicidal/homicidal ideation: Yes  In tx team Patient has not harmed self or others: Yes  For review of initial/current patient goals, please see plan of care.  Estimated Length of Stay:  d/c today   Reason for Continuation of Hospitalization: d/c today   New Problems/Goals identified:  N/A  Discharge Plan or Barriers:   Pt declined at [facility]. Referral sent to [facility] yesterday. Pt plans to return home with sig other until [facility] bed available. He plans to follow-up at [facility] for med management and [facility] for Primary care. Pt has appt scheduled at [facility]. He was provided with Mental Health Association resource guide and NA list for [facility] prior to d/c.   Additional Comments:    Attendees:  Signature: Ivin Booty, MD 02/09/2014 9:46 AM   Signature: Richelle Ito, LCSW 02/09/2014 9:46 AM  Signature: Trula Slade, LCSWA  02/09/2014 9:46 AM  Signature: Griffin Dakin RN 02/09/2014 9:46 AM  Signature: Christa RN 02/09/2014 9:46 AM  Signature: Charleston Ropes, CSW Intern 02/09/2014 9:46 AM  Signature:   02/09/2014 9:46 AM  Signature:    Signature:    Signature:    Signature:    Signature:    Signature:      Scribe for Treatment Team:   Trula Slade, LCSWA  02/09/2014 9:46 AM

## 2014-02-14 NOTE — Progress Notes (Signed)
Patient Discharge Instructions:  After Visit Summary (AVS):   Faxed to:  02/14/14 Discharge Summary Note:   Faxed to:  02/14/14 Psychiatric Admission Assessment Note:   Faxed to:  02/14/14 Suicide Risk Assessment - Discharge Assessment:   Faxed to:  02/14/14 Faxed/Sent to the Next Level Care provider:  02/14/14 Next Level Care Provider Has Access to the EMR, 02/14/14 [facility].  Per the SW documentation was already sent. Records provided to [facility] via [facility]/Epic access. [facility]ax[facility] to [facility] @ [phone]   Jerelene ReddenSheena E [place], 02/14/2014, 2:40 PM

## 2014-02-15 ENCOUNTER — Other Ambulatory Visit: Payer: Self-pay | Admitting: Internal Medicine

## 2014-02-26 ENCOUNTER — Ambulatory Visit (HOSPITAL_COMMUNITY)
Admission: RE | Admit: 2014-02-26 | Discharge: 2014-02-26 | Disposition: A | Discharge status: Home / Self Care | Payer: Federal, State, Local not specified - Other | Attending: Psychiatry | Admitting: Psychiatry

## 2014-02-26 ENCOUNTER — Emergency Department (HOSPITAL_COMMUNITY)
Admission: EM | Admit: 2014-02-26 | Discharge: 2014-02-27 | Disposition: A | Discharge status: Home / Self Care | Payer: Federal, State, Local not specified - Other | Attending: Emergency Medicine | Admitting: Emergency Medicine

## 2014-02-26 ENCOUNTER — Encounter (HOSPITAL_COMMUNITY): Payer: Self-pay | Admitting: Emergency Medicine

## 2014-02-26 DIAGNOSIS — Z609 Problem related to social environment, unspecified: Secondary | ICD-10-CM | POA: Insufficient documentation

## 2014-02-26 DIAGNOSIS — Z8781 Personal history of (healed) traumatic fracture: Secondary | ICD-10-CM | POA: Insufficient documentation

## 2014-02-26 DIAGNOSIS — M1990 Unspecified osteoarthritis, unspecified site: Secondary | ICD-10-CM | POA: Insufficient documentation

## 2014-02-26 DIAGNOSIS — Z87442 Personal history of urinary calculi: Secondary | ICD-10-CM | POA: Insufficient documentation

## 2014-02-26 DIAGNOSIS — Z791 Long term (current) use of non-steroidal anti-inflammatories (NSAID): Secondary | ICD-10-CM | POA: Insufficient documentation

## 2014-02-26 DIAGNOSIS — R4689 Other symptoms and signs involving appearance and behavior: Secondary | ICD-10-CM

## 2014-02-26 DIAGNOSIS — F1410 Cocaine abuse, uncomplicated: Secondary | ICD-10-CM | POA: Insufficient documentation

## 2014-02-26 DIAGNOSIS — F419 Anxiety disorder, unspecified: Secondary | ICD-10-CM

## 2014-02-26 DIAGNOSIS — F329 Major depressive disorder, single episode, unspecified: Secondary | ICD-10-CM | POA: Insufficient documentation

## 2014-02-26 DIAGNOSIS — Z720 Tobacco use: Secondary | ICD-10-CM | POA: Insufficient documentation

## 2014-02-26 DIAGNOSIS — Z21 Asymptomatic human immunodeficiency virus [HIV] infection status: Secondary | ICD-10-CM | POA: Insufficient documentation

## 2014-02-26 DIAGNOSIS — Z880 Allergy status to penicillin: Secondary | ICD-10-CM | POA: Insufficient documentation

## 2014-02-26 DIAGNOSIS — F1110 Opioid abuse, uncomplicated: Secondary | ICD-10-CM | POA: Insufficient documentation

## 2014-02-26 DIAGNOSIS — Z79899 Other long term (current) drug therapy: Secondary | ICD-10-CM | POA: Insufficient documentation

## 2014-02-26 DIAGNOSIS — F1910 Other psychoactive substance abuse, uncomplicated: Secondary | ICD-10-CM

## 2014-02-26 DIAGNOSIS — R4589 Other symptoms and signs involving emotional state: Secondary | ICD-10-CM

## 2014-02-26 DIAGNOSIS — B20 Human immunodeficiency virus [HIV] disease: Secondary | ICD-10-CM | POA: Insufficient documentation

## 2014-02-26 DIAGNOSIS — L237 Allergic contact dermatitis due to plants, except food: Secondary | ICD-10-CM

## 2014-02-26 DIAGNOSIS — F1920 Other psychoactive substance dependence, uncomplicated: Secondary | ICD-10-CM | POA: Diagnosis present

## 2014-02-26 DIAGNOSIS — F332 Major depressive disorder, recurrent severe without psychotic features: Secondary | ICD-10-CM | POA: Insufficient documentation

## 2014-02-26 DIAGNOSIS — F1510 Other stimulant abuse, uncomplicated: Secondary | ICD-10-CM | POA: Insufficient documentation

## 2014-02-26 DIAGNOSIS — F1994 Other psychoactive substance use, unspecified with psychoactive substance-induced mood disorder: Secondary | ICD-10-CM

## 2014-02-26 LAB — CBC
HCT: 40 % (ref 39.0–52.0)
Hemoglobin: 13.6 g/dL (ref 13.0–17.0)
MCH: 32.2 pg (ref 26.0–34.0)
MCHC: 34 g/dL (ref 30.0–36.0)
MCV: 94.8 fL (ref 78.0–100.0)
Platelets: 320 10*3/uL (ref 150–400)
RBC: 4.22 MIL/uL (ref 4.22–5.81)
RDW: 14.6 % (ref 11.5–15.5)
WBC: 7.7 10*3/uL (ref 4.0–10.5)

## 2014-02-26 LAB — RAPID URINE DRUG SCREEN, HOSP PERFORMED
AMPHETAMINES: POSITIVE — AB
BENZODIAZEPINES: NOT DETECTED
Barbiturates: NOT DETECTED
COCAINE: POSITIVE — AB
OPIATES: POSITIVE — AB
Tetrahydrocannabinol: NOT DETECTED

## 2014-02-26 LAB — COMPREHENSIVE METABOLIC PANEL
ALBUMIN: 3.7 g/dL (ref 3.5–5.2)
ALT: 34 U/L (ref 0–53)
AST: 21 U/L (ref 0–37)
Alkaline Phosphatase: 106 U/L (ref 39–117)
Anion gap: 12 (ref 5–15)
BUN: 12 mg/dL (ref 6–23)
CALCIUM: 9.4 mg/dL (ref 8.4–10.5)
CO2: 23 mEq/L (ref 19–32)
CREATININE: 0.97 mg/dL (ref 0.50–1.35)
Chloride: 107 mEq/L (ref 96–112)
GFR calc Af Amer: >90 mL/min (ref >90)
GFR calc non Af Amer: >90 mL/min (ref >90)
Glucose, Bld: 93 mg/dL (ref 70–99)
Potassium: 4.1 mEq/L (ref 3.7–5.3)
SODIUM: 142 meq/L (ref 137–147)
Total Bilirubin: <0.2 mg/dL — ABNORMAL LOW (ref 0.3–1.2)
Total Protein: 6.5 g/dL (ref 6.0–8.3)

## 2014-02-26 LAB — ETHANOL

## 2014-02-26 LAB — ACETAMINOPHEN LEVEL: Acetaminophen (Tylenol), Serum: <15 ug/mL (ref 10–30)

## 2014-02-26 LAB — SALICYLATE LEVEL: Salicylate Lvl: <2 mg/dL — ABNORMAL LOW (ref 2.8–20.0)

## 2014-02-26 MED ORDER — ACETAMINOPHEN 325 MG PO TABS: 650.0000 mg | ORAL_TABLET | ORAL | Status: DC | PRN

## 2014-02-26 MED ORDER — PREDNISONE (PAK) 10 MG PO TABS: 20.0000 mg | ORAL_TABLET | Freq: Every evening | ORAL | Status: DC

## 2014-02-26 MED ORDER — PREDNISONE (PAK) 10 MG PO TABS
10.0000 mg | ORAL_TABLET | ORAL | Status: AC
  Administered 2014-02-26: 10 mg via ORAL

## 2014-02-26 MED ORDER — ELVITEG-COBIC-EMTRICIT-TENOFDF 150-150-200-300 MG PO TABS
1.0000 | ORAL_TABLET | Freq: Every day | ORAL | Status: DC
Start: 2014-02-26 — End: 2014-02-27
  Administered 2014-02-26 – 2014-02-27 (×2): 1 via ORAL
  Filled 2014-02-26 (×3): qty 1

## 2014-02-26 MED ORDER — IBUPROFEN 200 MG PO TABS
600.0000 mg | ORAL_TABLET | Freq: Three times a day (TID) | ORAL | Status: DC | PRN
  Administered 2014-02-26 – 2014-02-27 (×2): 600 mg via ORAL
  Filled 2014-02-26 (×2): qty 3

## 2014-02-26 MED ORDER — HYDROCORTISONE 1 % EX CREA
TOPICAL_CREAM | Freq: Every day | CUTANEOUS | Status: DC
Start: 2014-02-26 — End: 2014-02-27
  Administered 2014-02-26 – 2014-02-27 (×2): via TOPICAL
  Filled 2014-02-26: qty 28

## 2014-02-26 MED ORDER — PREDNISONE (PAK) 10 MG PO TABS: 10.0000 mg | ORAL_TABLET | Freq: Four times a day (QID) | ORAL | Status: DC

## 2014-02-26 MED ORDER — ATENOLOL 50 MG PO TABS
50.0000 mg | ORAL_TABLET | Freq: Every day | ORAL | Status: DC
  Administered 2014-02-26 – 2014-02-27 (×2): 50 mg via ORAL
  Filled 2014-02-26 (×2): qty 1

## 2014-02-26 MED ORDER — ONDANSETRON HCL 4 MG PO TABS: 4.0000 mg | ORAL_TABLET | Freq: Three times a day (TID) | ORAL | Status: DC | PRN

## 2014-02-26 MED ORDER — PANTOPRAZOLE SODIUM 40 MG PO TBEC
40.0000 mg | DELAYED_RELEASE_TABLET | Freq: Every day | ORAL | Status: DC
  Administered 2014-02-26 – 2014-02-27 (×2): 40 mg via ORAL
  Filled 2014-02-26 (×2): qty 1

## 2014-02-26 MED ORDER — PREDNISONE (PAK) 10 MG PO TABS
10.0000 mg | ORAL_TABLET | ORAL | Status: AC
Start: 2014-02-26 — End: 2014-02-26
  Administered 2014-02-26: 10 mg via ORAL

## 2014-02-26 MED ORDER — PREDNISONE (PAK) 10 MG PO TABS
20.0000 mg | ORAL_TABLET | Freq: Every morning | ORAL | Status: AC
  Administered 2014-02-26: 20 mg via ORAL
  Filled 2014-02-26: qty 21

## 2014-02-26 MED ORDER — PREDNISONE (PAK) 10 MG PO TABS
10.0000 mg | ORAL_TABLET | Freq: Three times a day (TID) | ORAL | Status: DC
  Administered 2014-02-27 (×2): 10 mg via ORAL

## 2014-02-26 MED ORDER — LORAZEPAM 1 MG PO TABS
1.0000 mg | ORAL_TABLET | Freq: Three times a day (TID) | ORAL | Status: DC | PRN
Start: 2014-02-26 — End: 2014-02-27
  Administered 2014-02-26 – 2014-02-27 (×2): 1 mg via ORAL
  Filled 2014-02-26: qty 2
  Filled 2014-02-26: qty 1

## 2014-02-26 MED ORDER — GEMFIBROZIL 600 MG PO TABS
600.0000 mg | ORAL_TABLET | Freq: Two times a day (BID) | ORAL | Status: DC
  Administered 2014-02-26 – 2014-02-27 (×2): 600 mg via ORAL
  Filled 2014-02-26 (×4): qty 1

## 2014-02-26 MED ORDER — PREDNISONE (PAK) 10 MG PO TABS
20.0000 mg | ORAL_TABLET | Freq: Every evening | ORAL | Status: AC
  Administered 2014-02-26: 20 mg via ORAL

## 2014-02-26 NOTE — ED Notes (Signed)
Up on the phone 

## 2014-02-26 NOTE — ED Notes (Signed)
Pt sts he has not taken any his meds today

## 2014-02-26 NOTE — BH Assessment (Signed)
Tele Assessment Note   Devin Crawford is an 33 y.o. male that was assessed by CSW on this date via face to face, as pt presented voluntarily as a walk in to [facility].  Pt states that he has been depressed with thoughts of suicide.  Pt endorses SI at this time, with no Crawford or intent, as he decided to come here for help before it worsened.  Pt explained that he relapsed on cocaine 2 weeks ago but also started using meth, which is new for him, to substitute for cocaine when he was unable to get it.  Pt states that he was on a 6 day binge and can't recall exactly how much he used then but guesses 1/2 gram of cocaine and meth daily for that 6 day binge. Pt states that he has not used in 5 days.  Pt states that he accidentally text the wrong person which caused both his and his partner's families to find out about his relapse.  Pt states that his partner's family wants his partner to leave pt which is a big stressor to pt.  Pt's partner, Devin [facility], accompanied pt to [facility] and was in the lobby waiting for pt to be seen.  Devin Crawford can be reached at [phone].  Pt is worried about losing his partner of 7 years, but states that Devin Crawford continues to be supportive and wants pt to get help.    Pt states that he has been to [facility] 4 times this year.  Pt is current at [facility] and sees Devin Crawford for medication management.  Pt denies HI and A/V hallucinations.  Pt has sores on his legs and he states it is poison ivy, which he has had for 1 week.  Pt has not been seen by a medical provider about this.  Pt states that he is also HIV positive.    CSW and Verne Spurr, [place] agree that pt meets criteria for inpatient hospitalization.  Pt being transported by [facility] to [facility] ED for medical clearance.  TTS to seek placement for pt.    Axis I: Major Depression, Recurrent severe, Cocaine Use Disorder, Amphetamine Use Disorder Axis II: Deferred Axis III:  Past Medical History  Diagnosis Date  . HIV (human  immunodeficiency virus infection)   . Blood transfusion 2007  . Arthritis   . Kidney calculus   . Narcotic addiction   . Multiple pelvic fractures   . History of tonsillectomy   . hip fracture left     rod placement 04-2006  . History of spleen injury     removal 2007  . Mental disorder   . Depression    Axis IV: other psychosocial or environmental problems and problems with primary support group Axis V: 31-40 impairment in reality testing  Past Medical History:  Past Medical History  Diagnosis Date  . HIV (human immunodeficiency virus infection)   . Blood transfusion 2007  . Arthritis   . Kidney calculus   . Narcotic addiction   . Multiple pelvic fractures   . History of tonsillectomy   . hip fracture left     rod placement 04-2006  . History of spleen injury     removal 2007  . Mental disorder   . Depression     Past Surgical History  Procedure Laterality Date  . Splenectomy    . Hip fracture surgery    . Tonsillectomy      Family History:  Family History  Problem Relation  Age of Onset  . Multiple sclerosis Mother   . Stroke Father     Social History:  reports that he has been smoking Cigarettes.  He has a 1.8 pack-year smoking history. He has never used smokeless tobacco. He reports that he uses illicit drugs (Cocaine, Heroin, and Marijuana). He reports that he does not drink alcohol.  Additional Social History:  Alcohol / Drug Use Pain Medications: See MAR Prescriptions: See MAR Over the Counter: See MAR History of alcohol / drug use?: Yes Longest period of sobriety (when/how long): Unknown Negative Consequences of Use: Personal relationships Substance #1 Name of Substance 1: Cocaine 1 - Age of First Use: 24 1 - Amount (size/oz): 1/2 gram  1 - Frequency: daily - binged for 6 days a week ago 1 - Duration: off and on use for years, current binge 6 days 1 - Last Use / Amount: 5 days ago, 02/21/14 Substance #2 Name of Substance 2: Meth 2 - Age of  First Use: 89 - recently tried it, to subsitute when unable to get cocaine 2 - Amount (size/oz): 1/2 gram 2 - Frequency: daily - binged for 6 days a week ago 2 - Duration: 6 day binge 2 - Last Use / Amount: 5 days ago, 02/21/14  CIWA:   COWS:    PATIENT STRENGTHS: (choose at least two) Ability for insight Average or above average intelligence Capable of independent living Communication skills General fund of knowledge Motivation for treatment/growth Supportive family/friends  Allergies:  Allergies  Allergen Reactions  . Morphine And Related Other (See Comments)    Hallucinations   . Penicillins Other (See Comments)    edema  . Sulfa Antibiotics Hives  . Trazodone And Nefazodone Other (See Comments)    Night terrors  . Capsaicin Rash  . Sustiva [Efavirenz] Rash    Whole body rash     Home Medications:  (Not in a Crawford admission)  OB/GYN Status:  No LMP for male patient.  General Assessment Data Location of Assessment: [facility] Assessment Services ACT Assessment: Yes Is this a Tele or Face-to-Face Assessment?: Face-to-Face Is this an Initial Assessment or a Re-assessment for this encounter?: Initial Assessment Living Arrangements: Spouse/significant other Can pt return to current living arrangement?: Yes Admission Status: Voluntary Is patient capable of signing voluntary admission?: Yes Transfer from: Home Referral Source: Self/Family/Friend  Medical Screening Exam [facility] Walk-in ONLY) Medical Exam completed: No Reason for MSE not completed: Other:  [facility]in [facility] Living Arrangements: Spouse/significant other Name of Psychiatrist:  Ezra [facility]) Name of Therapist:  (none reported)  Education Status Is patient currently in school?: No Current Grade:  (N/A) Highest grade of school patient has completed:  (2 years of college) Name of school:  (N/A) Contact person:  (N/A)  Risk to self with the past 6 months Suicidal Ideation: Yes-Currently  Present Suicidal Intent: Yes-Currently Present Is patient at risk for suicide?: Yes Suicidal Crawford?: No Access to Means: Yes Specify Access to Suicidal Means:  (access to household items at home) What has been your use of drugs/alcohol within the last 12 months?:  (cocaine and meth abuse) Previous Attempts/Gestures: Yes How many times?:  (4) Other Self Harm Risks:  (family conflict) Triggers for Past Attempts: Other (Comment) (drug relapse) Intentional Self Injurious Behavior: None Family Suicide History: Unknown Recent stressful life event(s): Conflict (Comment);Other (Comment) (drug relapse, family found out) Persecutory voices/beliefs?: No Depression: Yes Depression Symptoms: Guilt;Feeling worthless/self pity Substance abuse history and/or treatment for substance abuse?: Yes Suicide  prevention information given to non-admitted patients: Not applicable  Risk to Others within the past 6 months Homicidal Ideation: No Thoughts of Harm to Others: No Current Homicidal Intent: No Current Homicidal Crawford: No Access to Homicidal Means: No Identified Victim:  (N/A) History of harm to others?: No Assessment of Violence: None Noted Violent Behavior Description:  (None noted) Does patient have access to weapons?: No Criminal Charges Pending?: No Does patient have a court date: No  Psychosis Hallucinations: None noted Delusions: None noted  Mental Status Report Appear/Hygiene: Unremarkable Eye Contact: Good Motor Activity: Unremarkable;Freedom of movement Speech: Logical/coherent;Unremarkable Level of Consciousness: Alert Mood: Depressed;Guilty;Sad;Worthless, low self-esteem Affect: Sad;Appropriate to circumstance;Depressed Anxiety Level: None Thought Processes: Coherent;Relevant Judgement: Unimpaired Orientation: Person;Place;Time;Situation;Appropriate for developmental age Obsessive Compulsive Thoughts/Behaviors: None  Cognitive Functioning Concentration: Normal Memory:  Recent Intact;Remote Intact IQ: Average Insight: Good Impulse Control: Fair Appetite: Good Weight Loss:  (none reported) Weight Gain:  (none reported) Sleep: No Change Total Hours of Sleep:  (unknown) Vegetative Symptoms: None  ADLScreening [facility] Assessment Services) Patient's cognitive ability adequate to safely complete daily activities?: Yes Patient able to express need for assistance with ADLs?: Yes Independently performs ADLs?: Yes (appropriate for developmental age)  Prior Inpatient Therapy Prior Inpatient Therapy: Yes Prior Therapy Dates:  [facility] Prior Therapy Facilty/Provider(s):  [facility] Reason for Treatment:  (depression, substance abuse)  Prior Outpatient Therapy Prior Outpatient Therapy: Yes Prior Therapy Dates:  (ongoing, current) Prior Therapy Facilty/Provider(s):  ([facility]) Reason for Treatment:  (med management)  ADL Screening (condition at time of admission) Patient's cognitive ability adequate to safely complete daily activities?: Yes Is the patient deaf or have difficulty hearing?: No Does the patient have difficulty seeing, even when wearing glasses/contacts?: No Does the patient have difficulty concentrating, remembering, or making decisions?: No Patient able to express need for assistance with ADLs?: Yes Does the patient have difficulty dressing or bathing?: No Independently performs ADLs?: Yes (appropriate for developmental age) Does the patient have difficulty walking or climbing stairs?: No Weakness of Legs: None Weakness of Arms/Hands: None  Home Assistive Devices/Equipment Home Assistive Devices/Equipment: None  Therapy Consults (therapy consults require a physician order) PT Evaluation Needed: No OT Evalulation Needed: No SLP Evaluation Needed: No Abuse/Neglect Assessment (Assessment to be complete while patient is alone) Physical Abuse: Denies Verbal Abuse: Denies Sexual Abuse:  Denies Exploitation of patient/patient's resources: Denies Self-Neglect: Denies Values / Beliefs Cultural Requests During Hospitalization: None Spiritual Requests During Hospitalization: None Consults Spiritual Care Consult Needed: No Social Work Consult Needed: No   Nutrition Screen- [facility] Adult/WL/AP Patient's home diet: Regular  Additional Information 1:1 In Past 12 Months?: No CIRT Risk: No Elopement Risk: No Does patient have medical clearance?: No  Child/Adolescent Assessment Running Away Risk: Denies Bed-Wetting: Denies Destruction of Property: Denies Cruelty to Animals: Denies Stealing: Denies Rebellious/Defies Authority: Denies Satanic Involvement: Denies Archivistire Setting: Denies Problems at [employer]: Denies Gang Involvement: Denies  Disposition:  Disposition Initial Assessment Completed for this Encounter: Yes Disposition of Patient: Inpatient treatment program Type of inpatient treatment program: Adult  Carmina MillerHorton, Lakia Gritton Nicole 02/26/2014 12:59 PM

## 2014-02-26 NOTE — ED Notes (Signed)
RT is looking at chart for report.

## 2014-02-26 NOTE — ED Provider Notes (Signed)
CSN: [id number]     Arrival date & time 02/26/14  1255 History   First MD Initiated Contact with Patient 02/26/14 1320     Chief Complaint  Patient presents with  . Suicidal  . Drug Problem     (Consider location/radiation/quality/duration/timing/severity/associated sxs/prior Treatment) HPI Comments: Pt comes in today after being sent over from [facility] for cocaine and crystal meth detox and suicidal thought. Pt states that he has been having suicidal thought because he is depressed related to"bad choices":no plan at this time. Was in inpatient treatment about 1 months ago. Denies using any drugs in the last 5 days.  The history is provided by the patient. No language interpreter was used.    Past Medical History  Diagnosis Date  . HIV (human immunodeficiency virus infection)   . Blood transfusion 2007  . Arthritis   . Kidney calculus   . Narcotic addiction   . Multiple pelvic fractures   . History of tonsillectomy   . hip fracture left     rod placement 04-2006  . History of spleen injury     removal 2007  . Mental disorder   . Depression    Past Surgical History  Procedure Laterality Date  . Splenectomy    . Hip fracture surgery    . Tonsillectomy     Family History  Problem Relation Age of Onset  . Multiple sclerosis Mother   . Stroke Father    History  Substance Use Topics  . Smoking status: Current Every Day Smoker -- 0.20 packs/day for 9 years    Types: Cigarettes    Last Attempt to Quit: 08/03/2013  . Smokeless tobacco: Never Used     Comment: restarted 2 weeks ago; encouraged to quit  . Alcohol Use: No    Review of Systems  Constitutional: Negative.   Respiratory: Negative.   Cardiovascular: Negative.       Allergies  Morphine and related; Penicillins; Sulfa antibiotics; Trazodone and nefazodone; Capsaicin; and Sustiva  Home Medications   Prior to Admission medications   Medication Sig Start Date End Date Taking? Authorizing Provider  atenolol  (TENORMIN) 50 MG tablet Take 1 tablet (50 mg total) by mouth daily. For high blood pressure 02/09/14  Yes Velna HatchetSheila May Agustin, NP  celecoxib (CELEBREX) 200 MG capsule Take 1 capsule (200 mg total) by mouth 2 (two) times daily. For arthritis 02/09/14  Yes Velna HatchetSheila May Agustin, NP  doxepin (SINEQUAN) 10 MG capsule Take 1 capsule (10 mg total) by mouth at bedtime. For mood stability 02/09/14  Yes Velna HatchetSheila May Agustin, NP  DULoxetine (CYMBALTA) 30 MG capsule Take 1 capsule (30 mg total) by mouth daily. For depression 02/09/14  Yes Velna HatchetSheila May Agustin, NP  elvitegravir-cobicistat-emtricitabine-tenofovir (STRIBILD) 150-150-200-300 MG TABS tablet Take 1 tablet by mouth daily with breakfast. For anti-viral medication/HIV 02/09/14  Yes Velna HatchetSheila May Agustin, NP  FLUoxetine (PROZAC) 40 MG capsule Take 1 capsule (40 mg total) by mouth daily. For depression 02/09/14  Yes Velna HatchetSheila May Agustin, NP  gabapentin (NEURONTIN) 400 MG capsule Take 2 capsules (800 mg total) by mouth 4 (four) times daily - after meals and at bedtime. For muscle pain 02/09/14  Yes Velna HatchetSheila May Agustin, NP  gemfibrozil (LOPID) 600 MG tablet Take 1 tablet (600 mg total) by mouth 2 (two) times daily before a meal. For cholesterol 02/09/14  Yes Velna HatchetSheila May Agustin, NP  hydrOXYzine (ATARAX/VISTARIL) 50 MG tablet Take 1 tablet (50 mg total) by mouth every 6 (six) hours as needed for  anxiety. For anxiety 02/09/14  Yes Velna HatchetSheila May Agustin, NP  metaxalone (SKELAXIN) 400 MG tablet Take 1 tablet (400 mg total) by mouth 3 (three) times daily with meals as needed for muscle spasms. For muscle/skeletal spasms 02/09/14  Yes Velna HatchetSheila May Agustin, NP  Multiple Vitamins-Minerals (MULTIVITAMIN WITH MINERALS) tablet Take 1 tablet by mouth daily. For vitamin supplementation 02/09/14  Yes Velna HatchetSheila May Agustin, NP  omeprazole (PRILOSEC) 40 MG capsule TAKE ONE CAPSULE BY MOUTH EVERY DAY 02/15/14  Yes Judyann Munsonynthia Snider, MD   BP 112/67  Pulse 86  Temp(Src) 98.4 F (36.9 C) (Oral)  Resp 20  SpO2  98% Physical Exam  Vitals reviewed. Constitutional: He is oriented to person, place, and time. He appears well-developed and well-nourished.  Cardiovascular: Regular rhythm.   Pulmonary/Chest: Effort normal and breath sounds normal.  Abdominal: Soft. Bowel sounds are normal.  Musculoskeletal: Normal range of motion.  Neurological: He is oriented to person, place, and time. Coordination normal.  Skin:  Vesicular rash noted to the right lower leg and bilateral forearm  Psychiatric: He expresses suicidal ideation.    ED Course  Procedures (including critical care time) Labs Review Labs Reviewed  URINE RAPID DRUG SCREEN (HOSP PERFORMED) - Abnormal; Notable for the following:    Opiates POSITIVE (*)    Cocaine POSITIVE (*)    Amphetamines POSITIVE (*)    All other components within normal limits  CBC  COMPREHENSIVE METABOLIC PANEL  ETHANOL  SALICYLATE LEVEL  ACETAMINOPHEN LEVEL    Imaging Review No results found.   EKG Interpretation   Date/Time:  Sunday February 26 2014 13:41:03 EDT Ventricular Rate:  78 PR Interval:  141 QRS Duration: 92 QT Interval:  387 QTC Calculation: 441 R Axis:   -20 Text Interpretation:  Sinus rhythm Borderline left axis deviation  Confirmed by Rubin PayorPICKERING  MD, Harrold DonathNATHAN [phone] on 02/26/2014 2:23:30 PM      MDM   Final diagnoses:  Polysubstance abuse  Suicidal behavior  Poison ivy    Pt to go over to psych ed. Dose pack ordered for poison ivy    Teressa LowerVrinda Advaith Lamarque, NP 02/26/14 1430

## 2014-02-26 NOTE — ED Notes (Signed)
[facility]. Pt. Alert and oriented in no distress denies SI, HI, AVH. Will continue to monitor for safety. Pt. Instructed to come to me with problems or concerns. Q 15 minute checks continue.

## 2014-02-26 NOTE — ED Notes (Signed)
Policy was explained  To pt, he was placed in scrubs, wanded by security, 1 shirt, one pair of shorts, 1 pair of flip flops and items placed at nurses station

## 2014-02-26 NOTE — ED Notes (Signed)
Peanut butter/crackers/milk given

## 2014-02-26 NOTE — ED Notes (Signed)
Pt wants detox from cocaine and crystal meth.  Pt also has depression and is on depression meds.  Pt is also stating he has had thoughts of hurting self.  No plan.  Long time ago history of attempted suicide.

## 2014-02-26 NOTE — ED Provider Notes (Signed)
Medical screening examination/treatment/procedure(s) were performed by non-physician practitioner and as supervising physician I was immediately available for consultation/collaboration.   EKG Interpretation   Date/Time:  Sunday February 26 2014 13:41:03 EDT Ventricular Rate:  78 PR Interval:  141 QRS Duration: 92 QT Interval:  387 QTC Calculation: 441 R Axis:   -20 Text Interpretation:  Sinus rhythm Borderline left axis deviation  Confirmed by Rubin PayorPICKERING  MD, Harrold DonathNATHAN [id number] on 02/26/2014 2:23:30 PM      Devoria AlbeIva Oveda Dadamo, MD, Franz DellFACEP   Saben Donigan L Sharnice Bosler, MD 02/26/14 1434

## 2014-02-26 NOTE — ED Notes (Signed)
Policy explained to pt, placed in scrubs, security paged, pt was waunded and his items have been placed at nurses station, 1 blue shirt, 1 pair of shorts, and flip flpos

## 2014-02-27 ENCOUNTER — Encounter (HOSPITAL_COMMUNITY): Payer: Self-pay | Admitting: Psychiatry

## 2014-02-27 DIAGNOSIS — F1994 Other psychoactive substance use, unspecified with psychoactive substance-induced mood disorder: Secondary | ICD-10-CM

## 2014-02-27 MED ORDER — DULOXETINE HCL 30 MG PO CPEP
30.0000 mg | ORAL_CAPSULE | Freq: Every day | ORAL | Status: DC
  Administered 2014-02-27: 30 mg via ORAL
  Filled 2014-02-27: qty 1

## 2014-02-27 NOTE — Consult Note (Signed)
[facility] Consult   Reason for Consult:  Suicidal ideations, polysubstance dependence Referring Physician:  EDP  Devin Crawford is an 33 y.o. male. Total Time spent with patient: 20 minutes  Assessment: AXIS I:  Substance Induced Mood Disorder; Polysubstance disorder AXIS II:  Deferred AXIS III:   Past Medical History  Diagnosis Date  . HIV (human immunodeficiency virus infection)   . Blood transfusion 2007  . Arthritis   . Kidney calculus   . Narcotic addiction   . Multiple pelvic fractures   . History of tonsillectomy   . hip fracture left     rod placement 04-2006  . History of spleen injury     removal 2007  . Mental disorder   . Depression    AXIS IV:  other psychosocial or environmental problems, problems related to social environment and problems with primary support group AXIS V:  61-70 mild symptoms  Plan:  No evidence of imminent risk to self or others at present.  Dr. Darleene Cleaver assessed the patient and concurs with the plan.  Subjective:   Devin Crawford is a 33 y.o. male patient does not warrant admission.  HPI:  The patient denies suicidal ideations today, along with homicidal ideations, hallucinations, and alcohol abuse.  He wants polysubstance detox (cocaine, opiates, and amphetamines).  Devin Crawford stated he did not follow-up with [facility] after his last admission and did not take the Prozac that was ordered.  He and his boyfriend are still together but having issues with his boyfriend's family.   HPI Elements:   Location:  generalized. Quality:  acute. Severity:  mild. Timing:  intermittent. Duration:  since discharge. Context:  stressors.  Past Psychiatric History: Past Medical History  Diagnosis Date  . HIV (human immunodeficiency virus infection)   . Blood transfusion 2007  . Arthritis   . Kidney calculus   . Narcotic addiction   . Multiple pelvic fractures   . History of tonsillectomy   . hip fracture left     rod placement  04-2006  . History of spleen injury     removal 2007  . Mental disorder   . Depression     reports that he has been smoking Cigarettes.  He has a 1.8 pack-year smoking history. He has never used smokeless tobacco. He reports that he uses illicit drugs (Cocaine, Heroin, and Marijuana). He reports that he does not drink alcohol. Family History  Problem Relation Age of Onset  . Multiple sclerosis Mother   . Stroke Father            Allergies:   Allergies  Allergen Reactions  . Morphine And Related Other (See Comments)    Hallucinations   . Penicillins Other (See Comments)    edema  . Sulfa Antibiotics Hives  . Trazodone And Nefazodone Other (See Comments)    Night terrors  . Capsaicin Rash  . Sustiva [Efavirenz] Rash    Whole body rash     ACT Assessment Complete:  Yes:    Educational Status    Risk to Self: Risk to self with the past 6 months Is patient at risk for suicide?: Yes Substance abuse history and/or treatment for substance abuse?: Yes  Risk to Others:    Abuse:    Prior Inpatient Therapy:    Prior Outpatient Therapy:    Additional Information:                    Objective: Blood pressure 123/69, pulse 95,  temperature 97.8 F (36.6 C), temperature source Oral, resp. rate 18, SpO2 98.00%.There is no weight on file to calculate BMI. Results for orders placed during the hospital encounter of 02/26/14 (from the past 72 hour(s))  CBC     Status: None   Collection Time    02/26/14  1:44 PM      Result Value Ref Range   WBC 7.7  4.0 - 10.5 K/uL   RBC 4.22  4.22 - 5.81 MIL/uL   Hemoglobin 13.6  13.0 - 17.0 g/dL   HCT 40.0  39.0 - 52.0 %   MCV 94.8  78.0 - 100.0 fL   MCH 32.2  26.0 - 34.0 pg   MCHC 34.0  30.0 - 36.0 g/dL   RDW 14.6  11.5 - 15.5 %   Platelets 320  150 - 400 K/uL  COMPREHENSIVE METABOLIC PANEL     Status: Abnormal   Collection Time    02/26/14  1:44 PM      Result Value Ref Range   Sodium 142  137 - 147 mEq/L   Potassium 4.1   3.7 - 5.3 mEq/L   Chloride 107  96 - 112 mEq/L   CO2 23  19 - 32 mEq/L   Glucose, Bld 93  70 - 99 mg/dL   BUN 12  6 - 23 mg/dL   Creatinine, Ser 0.97  0.50 - 1.35 mg/dL   Calcium 9.4  8.4 - 10.5 mg/dL   Total Protein 6.5  6.0 - 8.3 g/dL   Albumin 3.7  3.5 - 5.2 g/dL   AST 21  0 - 37 U/L   ALT 34  0 - 53 U/L   Alkaline Phosphatase 106  39 - 117 U/L   Total Bilirubin <0.2 (*) 0.3 - 1.2 mg/dL   GFR calc non Af Amer >90  >90 mL/min   GFR calc Af Amer >90  >90 mL/min   Comment: (NOTE)     The eGFR has been calculated using the CKD EPI equation.     This calculation has not been validated in all clinical situations.     eGFR's persistently <90 mL/min signify possible Chronic Kidney     Disease.   Anion gap 12  5 - 15  ETHANOL     Status: None   Collection Time    02/26/14  1:44 PM      Result Value Ref Range   Alcohol, Ethyl (B) <11  0 - 11 mg/dL   Comment:            LOWEST DETECTABLE LIMIT FOR     SERUM ALCOHOL IS 11 mg/dL     FOR MEDICAL PURPOSES ONLY  SALICYLATE LEVEL     Status: Abnormal   Collection Time    02/26/14  1:44 PM      Result Value Ref Range   Salicylate Lvl <3.7 (*) 2.8 - 20.0 mg/dL  ACETAMINOPHEN LEVEL     Status: None   Collection Time    02/26/14  1:44 PM      Result Value Ref Range   Acetaminophen (Tylenol), Serum <15.0  10 - 30 ug/mL   Comment:            THERAPEUTIC CONCENTRATIONS VARY     SIGNIFICANTLY. A RANGE OF 10-30     ug/mL MAY BE AN EFFECTIVE     CONCENTRATION FOR MANY PATIENTS.     HOWEVER, SOME ARE BEST TREATED     AT CONCENTRATIONS OUTSIDE THIS  RANGE.     ACETAMINOPHEN CONCENTRATIONS     >150 ug/mL AT 4 HOURS AFTER     INGESTION AND >50 ug/mL AT 12     HOURS AFTER INGESTION ARE     OFTEN ASSOCIATED WITH TOXIC     REACTIONS.  URINE RAPID DRUG SCREEN (HOSP PERFORMED)     Status: Abnormal   Collection Time    02/26/14  1:53 PM      Result Value Ref Range   Opiates POSITIVE (*) NONE DETECTED   Cocaine POSITIVE (*) NONE DETECTED    Benzodiazepines NONE DETECTED  NONE DETECTED   Amphetamines POSITIVE (*) NONE DETECTED   Tetrahydrocannabinol NONE DETECTED  NONE DETECTED   Barbiturates NONE DETECTED  NONE DETECTED   Comment:            DRUG SCREEN FOR MEDICAL PURPOSES     ONLY.  IF CONFIRMATION IS NEEDED     FOR ANY PURPOSE, NOTIFY LAB     WITHIN 5 DAYS.                LOWEST DETECTABLE LIMITS     FOR URINE DRUG SCREEN     Drug Class       Cutoff (ng/mL)     Amphetamine      1000     Barbiturate      200     Benzodiazepine   782     Tricyclics       956     Opiates          300     Cocaine          300     THC              50   Labs are reviewed and are pertinent for no medical issues.  Current Facility-Administered Medications  Medication Dose Route Frequency Provider Last Rate Last Dose  . acetaminophen (TYLENOL) tablet 650 mg  650 mg Oral Q4H PRN Glendell Docker, NP      . atenolol (TENORMIN) tablet 50 mg  50 mg Oral Daily Glendell Docker, NP   50 mg at 02/27/14 1106  . DULoxetine (CYMBALTA) DR capsule 30 mg  30 mg Oral Daily Waylan Boga, NP   30 mg at 02/27/14 1106  . elvitegravir-cobicistat-emtricitabine-tenofovir (STRIBILD) 150-150-200-300 MG tablet 1 tablet  1 tablet Oral Q breakfast Glendell Docker, NP   1 tablet at 02/27/14 [phone]  . gemfibrozil (LOPID) tablet 600 mg  600 mg Oral BID AC Glendell Docker, NP   600 mg at 02/27/14 [id number]  . hydrocortisone cream 1 %   Topical Daily Johnna Acosta, MD      . ibuprofen (ADVIL,MOTRIN) tablet 600 mg  600 mg Oral Q8H PRN Glendell Docker, NP   600 mg at 02/27/14 0836  . LORazepam (ATIVAN) tablet 1 mg  1 mg Oral Q8H PRN Glendell Docker, NP   1 mg at 02/27/14 0836  . ondansetron (ZOFRAN) tablet 4 mg  4 mg Oral Q8H PRN Glendell Docker, NP      . pantoprazole (PROTONIX) EC tablet 40 mg  40 mg Oral Daily Glendell Docker, NP   40 mg at 02/27/14 1106  . predniSONE (STERAPRED UNI-PAK) tablet 10 mg  10 mg Oral 3 x daily with food Glendell Docker, NP   10 mg at  02/27/14 0834  . [START ON 02/28/2014] predniSONE (STERAPRED UNI-PAK) tablet 10 mg  10 mg Oral 4X daily taper Glendell Docker, NP      .  predniSONE (STERAPRED UNI-PAK) tablet 20 mg  20 mg Oral Nightly Glendell Docker, NP       Current Outpatient Prescriptions  Medication Sig Dispense Refill  . atenolol (TENORMIN) 50 MG tablet Take 1 tablet (50 mg total) by mouth daily. For high blood pressure      . celecoxib (CELEBREX) 200 MG capsule Take 1 capsule (200 mg total) by mouth 2 (two) times daily. For arthritis  60 capsule  0  . doxepin (SINEQUAN) 10 MG capsule Take 1 capsule (10 mg total) by mouth at bedtime. For mood stability  30 capsule  0  . DULoxetine (CYMBALTA) 30 MG capsule Take 1 capsule (30 mg total) by mouth daily. For depression  30 capsule  0  . elvitegravir-cobicistat-emtricitabine-tenofovir (STRIBILD) 150-150-200-300 MG TABS tablet Take 1 tablet by mouth daily with breakfast. For anti-viral medication/HIV  30 tablet    . FLUoxetine (PROZAC) 40 MG capsule Take 1 capsule (40 mg total) by mouth daily. For depression  30 capsule  0  . gabapentin (NEURONTIN) 400 MG capsule Take 2 capsules (800 mg total) by mouth 4 (four) times daily - after meals and at bedtime. For muscle pain  240 capsule  0  . gemfibrozil (LOPID) 600 MG tablet Take 1 tablet (600 mg total) by mouth 2 (two) times daily before a meal. For cholesterol      . hydrOXYzine (ATARAX/VISTARIL) 50 MG tablet Take 1 tablet (50 mg total) by mouth every 6 (six) hours as needed for anxiety. For anxiety  30 tablet  0  . metaxalone (SKELAXIN) 400 MG tablet Take 1 tablet (400 mg total) by mouth 3 (three) times daily with meals as needed for muscle spasms. For muscle/skeletal spasms  15 tablet  0  . Multiple Vitamins-Minerals (MULTIVITAMIN WITH MINERALS) tablet Take 1 tablet by mouth daily. For vitamin supplementation      . omeprazole (PRILOSEC) 40 MG capsule TAKE ONE CAPSULE BY MOUTH EVERY DAY  30 capsule  5    Psychiatric Specialty  Exam:     Blood pressure 123/69, pulse 95, temperature 97.8 F (36.6 C), temperature source Oral, resp. rate 18, SpO2 98.00%.There is no weight on file to calculate BMI.  General Appearance: Casual  Eye Contact::  Good  Speech:  Normal Rate  Volume:  Normal  Mood:  Anxious  Affect:  Congruent  Thought Process:  Coherent  Orientation:  Full (Time, Place, and Person)  Thought Content:  WDL  Suicidal Thoughts:  No  Homicidal Thoughts:  No  Memory:  Immediate;   Good Recent;   Good Remote;   Good  Judgement:  Fair  Insight:  Fair  Psychomotor Activity:  Normal  Concentration:  Good  Recall:  Good  Fund of Knowledge:Good  Language: Good  Akathisia:  No  Handed:  Right  AIMS (if indicated):     Assets:  Communication Skills Desire for Improvement Financial Resources/Insurance Housing Intimacy Leisure Time Physical Health Resilience Social Support Transportation  Sleep:      Musculoskeletal: Strength & Muscle Tone: within normal limits Gait & Station: normal Patient leans: N/A  Treatment Plan Summary: Discharge home with follow-up with [facility] and other resources.  Waylan Boga, [place] 02/27/2014 12:38 PM  Patient seen, evaluated and I agree with notes by Nurse Practitioner. Corena Pilgrim, MD

## 2014-02-27 NOTE — ED Notes (Signed)
Pt d/c from the hospital with his friend. All items returned. D/C instructions given and prednisone pack along with hydrocortisone. Per NP ok to give these medications to the pt. Pt denies si and hi.

## 2014-02-27 NOTE — [facility] Suicide Risk Assessment (Signed)
Suicide Risk Assessment  Discharge Assessment     Demographic Factors:  Male, Caucasian and Gay, lesbian, or bisexual orientation  Total Time spent with patient: 20 minutes  Psychiatric Specialty Exam:     Blood pressure 123/69, pulse 95, temperature 97.8 F (36.6 C), temperature source Oral, resp. rate 18, SpO2 98.00%.There is no weight on file to calculate BMI.  General Appearance: Casual  Eye Contact::  Good  Speech:  Normal Rate  Volume:  Normal  Mood:  Anxious  Affect:  Congruent  Thought Process:  Coherent  Orientation:  Full (Time, Place, and Person)  Thought Content:  WDL  Suicidal Thoughts:  No  Homicidal Thoughts:  No  Memory:  Immediate;   Good Recent;   Good Remote;   Good  Judgement:  Fair  Insight:  Fair  Psychomotor Activity:  Normal  Concentration:  Good  Recall:  Good  Fund of Knowledge:Good  Language: Good  Akathisia:  No  Handed:  Right  AIMS (if indicated):     Assets:  Communication Skills Desire for Improvement Financial Resources/Insurance Housing Intimacy Leisure Time Physical Health Resilience Social Support Transportation  Sleep:      Musculoskeletal: Strength & Muscle Tone: within normal limits Gait & Station: normal Patient leans: N/A  Mental Status Per Nursing Assessment::   On Admission:   Suicidal ideations  Current Mental Status by Physician: NA  Loss Factors: NA  Historical Factors: NA  Risk Reduction Factors:   Sense of responsibility to family, Living with another person, especially a relative and Positive social support  Continued Clinical Symptoms:  Some anxiety  Cognitive Features That Contribute To Risk:  None  Suicide Risk:  Minimal: No identifiable suicidal ideation.  Patients presenting with no risk factors but with morbid ruminations; may be classified as minimal risk based on the severity of the depressive symptoms  Discharge Diagnoses:   AXIS I:  Substance Induced Mood Disorder; polysubstance  dependence AXIS II:  Deferred AXIS III:   Past Medical History  Diagnosis Date  . HIV (human immunodeficiency virus infection)   . Blood transfusion 2007  . Arthritis   . Kidney calculus   . Narcotic addiction   . Multiple pelvic fractures   . History of tonsillectomy   . hip fracture left     rod placement 04-2006  . History of spleen injury     removal 2007  . Mental disorder   . Depression    AXIS IV:  other psychosocial or environmental problems, problems related to social environment and problems with primary support group AXIS V:  61-70 mild symptoms  Plan Of Care/Follow-up recommendations:  Activity:  as tolerated Diet:  low-sodium heart healthy diet  Is patient on multiple antipsychotic therapies at discharge:  No   Has Patient had three or more failed trials of antipsychotic monotherapy by history:  No  Recommended Plan for Multiple Antipsychotic Therapies: NA    LORD, JAMISON, PMH-NP 02/27/2014, 12:44 PM

## 2014-02-27 NOTE — ED Notes (Signed)
Pt reports that he attempted to contact [facility] for three days after he left the hospital on his previous stay and he relapsed waiting on a bed. Pt reports that he wants help to stay off of drugs. He denies si and hi. Safety maintained in the [facility].

## 2014-02-27 NOTE — BH Assessment (Signed)
Discharge home to follow up with [facility] for residential substance abuse treatment. Patient placed on their wait list. His bed is expected to become available in a few days. [facility]

## 2014-03-19 ENCOUNTER — Emergency Department (HOSPITAL_COMMUNITY)
Admission: EM | Admit: 2014-03-19 | Discharge: 2014-03-19 | Disposition: A | Discharge status: Home / Self Care | Payer: Self-pay | Attending: Emergency Medicine | Admitting: Emergency Medicine

## 2014-03-19 ENCOUNTER — Encounter (HOSPITAL_COMMUNITY): Payer: Self-pay | Admitting: Emergency Medicine

## 2014-03-19 DIAGNOSIS — Z87442 Personal history of urinary calculi: Secondary | ICD-10-CM | POA: Insufficient documentation

## 2014-03-19 DIAGNOSIS — Z791 Long term (current) use of non-steroidal anti-inflammatories (NSAID): Secondary | ICD-10-CM | POA: Insufficient documentation

## 2014-03-19 DIAGNOSIS — Z720 Tobacco use: Secondary | ICD-10-CM | POA: Insufficient documentation

## 2014-03-19 DIAGNOSIS — M1990 Unspecified osteoarthritis, unspecified site: Secondary | ICD-10-CM | POA: Insufficient documentation

## 2014-03-19 DIAGNOSIS — Z79899 Other long term (current) drug therapy: Secondary | ICD-10-CM | POA: Insufficient documentation

## 2014-03-19 DIAGNOSIS — Z21 Asymptomatic human immunodeficiency virus [HIV] infection status: Secondary | ICD-10-CM | POA: Insufficient documentation

## 2014-03-19 DIAGNOSIS — Z8781 Personal history of (healed) traumatic fracture: Secondary | ICD-10-CM | POA: Insufficient documentation

## 2014-03-19 DIAGNOSIS — Z87828 Personal history of other (healed) physical injury and trauma: Secondary | ICD-10-CM | POA: Insufficient documentation

## 2014-03-19 DIAGNOSIS — Z880 Allergy status to penicillin: Secondary | ICD-10-CM | POA: Insufficient documentation

## 2014-03-19 DIAGNOSIS — F1994 Other psychoactive substance use, unspecified with psychoactive substance-induced mood disorder: Secondary | ICD-10-CM

## 2014-03-19 DIAGNOSIS — F329 Major depressive disorder, single episode, unspecified: Secondary | ICD-10-CM | POA: Insufficient documentation

## 2014-03-19 DIAGNOSIS — F1514 Other stimulant abuse with stimulant-induced mood disorder: Secondary | ICD-10-CM | POA: Insufficient documentation

## 2014-03-19 LAB — COMPREHENSIVE METABOLIC PANEL
ALT: 33 U/L (ref 0–53)
AST: 42 U/L — ABNORMAL HIGH (ref 0–37)
Albumin: 4.7 g/dL (ref 3.5–5.2)
Alkaline Phosphatase: 111 U/L (ref 39–117)
Anion gap: 17 — ABNORMAL HIGH (ref 5–15)
BUN: 15 mg/dL (ref 6–23)
CALCIUM: 9.8 mg/dL (ref 8.4–10.5)
CHLORIDE: 101 meq/L (ref 96–112)
CO2: 22 meq/L (ref 19–32)
CREATININE: 1.48 mg/dL — AB (ref 0.50–1.35)
GFR, EST AFRICAN AMERICAN: 71 mL/min — AB (ref >90)
GFR, EST NON AFRICAN AMERICAN: 62 mL/min — AB (ref >90)
GLUCOSE: 96 mg/dL (ref 70–99)
Potassium: 3.2 mEq/L — ABNORMAL LOW (ref 3.7–5.3)
Sodium: 140 mEq/L (ref 137–147)
Total Bilirubin: 1.3 mg/dL — ABNORMAL HIGH (ref 0.3–1.2)
Total Protein: 7.7 g/dL (ref 6.0–8.3)

## 2014-03-19 LAB — URINALYSIS, ROUTINE W REFLEX MICROSCOPIC
Bilirubin Urine: NEGATIVE
Glucose, UA: NEGATIVE mg/dL
Hgb urine dipstick: NEGATIVE
Ketones, ur: NEGATIVE mg/dL
LEUKOCYTES UA: NEGATIVE
NITRITE: NEGATIVE
PH: 5.5 (ref 5.0–8.0)
PROTEIN: NEGATIVE mg/dL
Specific Gravity, Urine: 1.017 (ref 1.005–1.030)
Urobilinogen, UA: 0.2 mg/dL (ref 0.0–1.0)

## 2014-03-19 LAB — CBC WITH DIFFERENTIAL/PLATELET
Basophils Absolute: 0 10*3/uL (ref 0.0–0.1)
Basophils Relative: 0 % (ref 0–1)
EOS PCT: 0 % (ref 0–5)
Eosinophils Absolute: 0.1 10*3/uL (ref 0.0–0.7)
HEMATOCRIT: 40.4 % (ref 39.0–52.0)
HEMOGLOBIN: 14.1 g/dL (ref 13.0–17.0)
LYMPHS ABS: 2.9 10*3/uL (ref 0.7–4.0)
LYMPHS PCT: 22 % (ref 12–46)
MCH: 32.9 pg (ref 26.0–34.0)
MCHC: 34.9 g/dL (ref 30.0–36.0)
MCV: 94.4 fL (ref 78.0–100.0)
MONOS PCT: 11 % (ref 3–12)
Monocytes Absolute: 1.4 10*3/uL — ABNORMAL HIGH (ref 0.1–1.0)
NEUTROS ABS: 9 10*3/uL — AB (ref 1.7–7.7)
Neutrophils Relative %: 67 % (ref 43–77)
Platelets: 292 10*3/uL (ref 150–400)
RBC: 4.28 MIL/uL (ref 4.22–5.81)
RDW: 13.9 % (ref 11.5–15.5)
WBC: 13.4 10*3/uL — AB (ref 4.0–10.5)

## 2014-03-19 LAB — RAPID URINE DRUG SCREEN, HOSP PERFORMED
Amphetamines: POSITIVE — AB
BENZODIAZEPINES: NOT DETECTED
Barbiturates: NOT DETECTED
Cocaine: NOT DETECTED
Opiates: NOT DETECTED
Tetrahydrocannabinol: NOT DETECTED

## 2014-03-19 LAB — CK: CK TOTAL: 862 U/L — AB (ref 7–232)

## 2014-03-19 LAB — ETHANOL: Alcohol, Ethyl (B): <11 mg/dL (ref 0–11)

## 2014-03-19 MED ORDER — SODIUM CHLORIDE 0.9 % IV BOLUS (SEPSIS)
1000.0000 mL | Freq: Once | INTRAVENOUS | Status: AC
  Administered 2014-03-19: 1000 mL via INTRAVENOUS

## 2014-03-19 MED ORDER — ALPRAZOLAM 0.25 MG PO TABS: 0.2500 mg | ORAL_TABLET | ORAL | Status: DC | PRN

## 2014-03-19 MED ORDER — LORAZEPAM 2 MG/ML IJ SOLN
2.0000 mg | Freq: Once | INTRAMUSCULAR | Status: AC
  Administered 2014-03-19: 2 mg via INTRAVENOUS
  Filled 2014-03-19: qty 1

## 2014-03-19 MED ORDER — SODIUM CHLORIDE 0.9 % IV BOLUS (SEPSIS)
30.0000 mL/kg | Freq: Once | INTRAVENOUS | Status: AC
  Administered 2014-03-19: 1000 mL via INTRAVENOUS

## 2014-03-19 MED ORDER — SODIUM CHLORIDE 0.9 % IV BOLUS (SEPSIS): 1000.0000 mL | Freq: Once | INTRAVENOUS | Status: DC

## 2014-03-19 NOTE — ED Notes (Signed)
Mother told him that he cant come to her house due to his drug use.  Pt attempting to reach other relatives/friends.

## 2014-03-19 NOTE — ED Notes (Addendum)
He is awake, slightly drowsy and in no distress.  We have gotten him some food and drink, for which he thanks us.  Dr. Freida BusmanAllen has just re-evaluated him and spoke with him at some length.
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
## 2014-03-19 NOTE — ED Notes (Signed)
He refuses any v.s. Or any further intervention of any kind.  He is escorted to our lobby at this time with three security guards.

## 2014-03-19 NOTE — ED Notes (Signed)
Pt at desk using profanity in room and while talking on phone. Writer instructed pt to use less profanity and wait for ride in room. Pt become louder and started Field seismologistcalling writer "f---- B-----" Due to behavior and attitude he was escorted to lobby then off the premises.

## 2014-03-19 NOTE — Discharge Instructions (Signed)
Stimulant Use Disorder-Amphetamines  °Amphetamines are one of a group of powerful drugs known as stimulants. Amphetamines have a number of medical uses, including the treatment of a daytime sleepiness disorder due to narcolepsy or sleep apnea, attention deficit hyperactivity disorder, and chronic fatigue syndrome. However, amphetamines also are often misused because of the effects they produce. These effects include: °· A feeling of extreme pleasure (euphoria). °· Alertness. °· Increased attention. °· High energy. °· Loss of appetite for weight loss. °Common street names for these drugs include speed and crank. Amphetamines are taken by mouth, crushed and snorted, or dissolved in water and injected. °Stimulants are addictive because they activate regions of the brain that are responsible for producing both the pleasurable sensation of "reward" and psychological dependence. Together, these actions account for loss of control and the rapid development of drug dependence. This means you will become ill without the drug (withdrawal) and need to keep using it to function.  °Stimulant use disorder is use of stimulants that disrupts your daily life. It disrupts relationships with family and friends and how you do your job. Amphetamines increase blood pressure and heart rate. Use can lead to heart attack or stroke. Use can also cause death from irregular heart rate, seizures, or dangerously high body temperature. °SIGNS AND SYMPTOMS  °Symptoms of stimulant use disorder with amphetamines include: °· Use of amphetamines in larger amounts or over a longer period than intended. °· Unsuccessful attempts to cut down or control amphetamine use. °· A lot of time spent obtaining, using, or recovering from the effects of amphetamines. °· A strong desire or urge to use amphetamines (craving). °· Continued use of amphetamines in spite of major problems at work, school, or home because of use. °· Continued use of amphetamines in spite  of relationship problems because of use. °· Giving up or cutting down on important life activities because of amphetamine use. °· Use of amphetamines over and over in situations when it is physically hazardous, such as driving a car. °· Continued use of amphetamines in spite of a physical problem that is likely related to amphetamine use. Physical problems can include: °¨ Unintended weight loss. °¨ High blood pressure. °¨ Chest pain. °¨ Infections such as human immunodeficiency virus and hepatitis (from injecting amphetamines). °· Continued use of amphetamines in spite of mental problems that are likely related to use. Mental problems can include: °¨ Anxiety. °¨ Sleep problems. °¨ Schizophrenia-like symptoms. °¨ Depression. °¨ Bipolar mood swings. °¨ Violent behavior. °· Need to use more and more amphetamines to get the same effect, or lessened effect over time with use of the same amount (tolerance). °· Having withdrawal symptoms when amphetamine use is stopped, or using amphetamines to reduce or avoid withdrawal symptoms. Withdrawal symptoms include: °¨ Depressed mood. °¨ Low energy or restlessness. °¨ Bad dreams. °¨ Too little or too much sleep. °¨ Increased appetite. °DIAGNOSIS  °Stimulant use disorder is diagnosed by your health care provider. You may be asked questions about your amphetamine use and how it affects your life. A physical exam may be done. A drug screen may be ordered. You may be referred to a mental health professional. The diagnosis of stimulant use disorder requires two or more symptoms within 12 months. The type of stimulant use disorder you have depends on the number of signs and symptoms you have. The type may be: °· Mild. Two or three signs and symptoms. °· Moderate. Four or five signs and symptoms. °· Severe. Six or more signs   and symptoms. TREATMENT  The treatment for most problems related to stimulant use disorder with amphetamines may be divided into two types:  Short-term medical  treatment. This helps to preserve life and prevent or minimize damage from physical or mental problems related to use.  Long-term substance abuse treatment. This focuses on recovery from use disorder. It is provided by mental health professionals who have training in substance use disorders. It is usually a combination of counseling, support groups, and nonaddictive medicines that can reduce cravings or block the effects of amphetamines. HOME CARE INSTRUCTIONS   Take medicines only as directed by your health care provider.  Identify the people and activities that trigger your amphetamine use and avoid them.  Keep all follow-up visits as directed by your health care provider. SEEK MEDICAL CARE IF:  Your symptoms get worse or you relapse.  You are not able to take medicines as directed. SEEK IMMEDIATE MEDICAL CARE IF:   You have serious thoughts about hurting yourself or others.  You have a seizure, chest pain, sudden weakness, or loss of speech or vision. FOR MORE INFORMATION  National Institute on Drug Abuse: http://www.price-smith.com/www.drugabuse.gov  Substance Abuse and Mental Health Services Administration: SkateOasis.com.ptwww.samhsa.gov Document Released: 04/29/2001 Document Revised: 09/19/2013 Document Reviewed: 05/18/2013 [facility] Patient Information 2015 [place], [place]. This information is not intended to replace advice given to you by your health care provider. Make sure you discuss any questions you have with your health care provider. Substance Abuse Treatment Programs  Intensive Outpatient Programs [facility]     [address]      [place], [place]                   [phone]       [facility] [address] [place], [place] [phone]  [facility]     (Inpatient and outpatient)     [address]           [phone]    [facility] [phone] (Suboxone and Methadone)  [address]      [place], [place] [address]      [phone]       [address] [place], [place] [phone]  [facility] (Outpatient/Inpatient, [facility])    (insurance only) [phone]             [facility] (Groups & Residential) [place], [place] [phone]     [facility]     [address]     [place], [place]      [phone]       [facility] (for caregivers and family) [phone] [address] [place], [place] [phone]      Residential Treatment Programs [facility]      [address], [place], [place] [address]  [phone]       [facility] [address] [place], [place] [address] [phone]  [facility]        [phone]       [facility] [phone]  [facility] ([facility]             [address]                                         [place], [place]  [phone] or [place], [address] [phone]  [facility] [place]    [address]      [place], [place]       [facility] [place], [place]  [place]   [address] [place], [place] [address]     [phone]      Admissions: 8am-3pm M-F  [facility] (RTS) [address] [place], [place]  [facility] Program: Residential Program [phone] Days)   [place], [place] or [phone]     [facility]: [facility] [place], [place] (Walk in Hours over the weekend or by referral)  [facility] [place], [place], [place] [address] [phone]  Crisis Mobile: [facility]:  [phone] (for crisis response 24 hours a day) [facility] Hotline:      [phone] Outpatient Psychiatry and  Counseling  [facility]: Mobile Crisis Management 24 hours:  [phone]  [facility] [employer] sliding scale fee and walk in schedule: M-F 8am-12pm/1pm-3pm [place], [place] [address] [address] [place], [place] [address] [phone]  [facility])- new patient walk-in appointments available Monday - Friday 8am -3pm.          [address] [place], [place] [address] [phone] or crisis line- [facility]/ Intensive Outpatient Therapy Program [place], [place] [address] [place]      [phone] [address] [place], [place] [address]                 [place]   [facility] [phone]. [place], [place] [address]   [facility]          [address] Johnette Abraham  [place], [place] [address]       [phone]  [facility] [address] [place], [place] [address] [phone]  [facility]    [address] [place], [place] [address]     [place], [place] Joycelyn Man     [place] [address]     [phone]       [facility] [place] [address]  [facility]     [address], [place], [place] [address] [place], [place] [address] [place]     [address]     [place], [place] [address]     [phone]       [facility] [address] [place], [place] [address] [phone] Resources for Temporary  Residential Assistance/Crisis  [facility]) M-F 8am-3pm   [address] [place], [place] [address]   [phone] Services include: laundry, barbering, support groups, case management, phone  & computer access, showers, AA/NA mtgs, mental health/substance abuse nurse, job skills class, disability information, VA assistance, spiritual classes, etc.   [facility]   [address], [place]     [place]              [employer] (women and children)       [place]. [place], [place] [address] [phone] [email or web address] for application and process Application Required  [facility]   [address]    [place] [address]     [phone]                    [place], [place] [address] [phone](BWIOMBTD application appt.) Application Required  [facility] (women only)    [address]     [place], [place] [address]     [phone]      Intake starts 6pm daily Need valid ID, SSC, & Police report [facility] [address] [place], [place] [phone] Application Required  [employer] (men only)     [place].      [place], [place]       [place] (Pregnant women only) [address] [place], [place]  [facility]      [address]      [place], [place] [address]     [phone]             [facility] [address] [place], [place] 90 day commitment/SA/Application process  [facility](men only)     [address]     [place], [place]       Check-in at [facility] [address] [place] [address] [place], [place] [address] [phone] Men/Women/Women and Children must be there by 7  pm  [place], [place]

## 2014-03-19 NOTE — ED Notes (Signed)
He had formerly been thrashing about, confabulating as if hallucinating; however, now he is sound asleep and is comfortable in appearance.

## 2014-03-19 NOTE — ED Notes (Signed)
We are holding pt. Until we contact his ride home.  He has a partner named Thayer OhmChris, whom we have been attempting to phone without success thus far.
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
## 2014-03-19 NOTE — ED Notes (Signed)
Pt requesting BH referral for drug problem, EDP notified

## 2014-03-19 NOTE — ED Notes (Signed)
Pt thrashing about in bed mumbling in conversation, spoke with Dr Freida BusmanAllen regarding agitation, he requested we feed pt,  sandwich and soda given.

## 2014-03-23 ENCOUNTER — Emergency Department (HOSPITAL_COMMUNITY): Payer: Self-pay

## 2014-03-23 ENCOUNTER — Emergency Department (HOSPITAL_COMMUNITY)
Admission: EM | Admit: 2014-03-23 | Discharge: 2014-03-25 | Disposition: A | Discharge status: Other Acute Inpatient Hospital | Payer: Federal, State, Local not specified - Other | Attending: Emergency Medicine | Admitting: Emergency Medicine

## 2014-03-23 ENCOUNTER — Encounter (HOSPITAL_COMMUNITY): Payer: Self-pay | Admitting: Emergency Medicine

## 2014-03-23 DIAGNOSIS — T50901A Poisoning by unspecified drugs, medicaments and biological substances, accidental (unintentional), initial encounter: Secondary | ICD-10-CM | POA: Diagnosis present

## 2014-03-23 DIAGNOSIS — T375X2A Poisoning by antiviral drugs, intentional self-harm, initial encounter: Secondary | ICD-10-CM | POA: Insufficient documentation

## 2014-03-23 DIAGNOSIS — T1491XA Suicide attempt, initial encounter: Secondary | ICD-10-CM | POA: Diagnosis present

## 2014-03-23 DIAGNOSIS — Z8781 Personal history of (healed) traumatic fracture: Secondary | ICD-10-CM | POA: Insufficient documentation

## 2014-03-23 DIAGNOSIS — F1920 Other psychoactive substance dependence, uncomplicated: Secondary | ICD-10-CM | POA: Diagnosis present

## 2014-03-23 DIAGNOSIS — S0993XA Unspecified injury of face, initial encounter: Secondary | ICD-10-CM

## 2014-03-23 DIAGNOSIS — F332 Major depressive disorder, recurrent severe without psychotic features: Secondary | ICD-10-CM | POA: Diagnosis present

## 2014-03-23 DIAGNOSIS — Z21 Asymptomatic human immunodeficiency virus [HIV] infection status: Secondary | ICD-10-CM | POA: Insufficient documentation

## 2014-03-23 DIAGNOSIS — F329 Major depressive disorder, single episode, unspecified: Secondary | ICD-10-CM | POA: Insufficient documentation

## 2014-03-23 DIAGNOSIS — T50902A Poisoning by unspecified drugs, medicaments and biological substances, intentional self-harm, initial encounter: Secondary | ICD-10-CM

## 2014-03-23 DIAGNOSIS — M1990 Unspecified osteoarthritis, unspecified site: Secondary | ICD-10-CM | POA: Insufficient documentation

## 2014-03-23 DIAGNOSIS — Y9289 Other specified places as the place of occurrence of the external cause: Secondary | ICD-10-CM | POA: Insufficient documentation

## 2014-03-23 DIAGNOSIS — Y9389 Activity, other specified: Secondary | ICD-10-CM | POA: Insufficient documentation

## 2014-03-23 DIAGNOSIS — S0990XA Unspecified injury of head, initial encounter: Secondary | ICD-10-CM | POA: Insufficient documentation

## 2014-03-23 DIAGNOSIS — F1120 Opioid dependence, uncomplicated: Secondary | ICD-10-CM | POA: Diagnosis present

## 2014-03-23 DIAGNOSIS — Z87442 Personal history of urinary calculi: Secondary | ICD-10-CM | POA: Insufficient documentation

## 2014-03-23 LAB — RAPID URINE DRUG SCREEN, HOSP PERFORMED
Amphetamines: POSITIVE — AB
BENZODIAZEPINES: NOT DETECTED
Barbiturates: NOT DETECTED
COCAINE: NOT DETECTED
Opiates: NOT DETECTED
Tetrahydrocannabinol: NOT DETECTED

## 2014-03-23 LAB — COMPREHENSIVE METABOLIC PANEL
ALK PHOS: 100 U/L (ref 39–117)
ALT: 28 U/L (ref 0–53)
AST: 28 U/L (ref 0–37)
Albumin: 3.9 g/dL (ref 3.5–5.2)
Anion gap: 15 (ref 5–15)
BUN: 9 mg/dL (ref 6–23)
CO2: 25 mEq/L (ref 19–32)
Calcium: 9.7 mg/dL (ref 8.4–10.5)
Chloride: 100 mEq/L (ref 96–112)
Creatinine, Ser: 1.21 mg/dL (ref 0.50–1.35)
GFR calc Af Amer: >90 mL/min (ref >90)
GFR calc non Af Amer: 78 mL/min — ABNORMAL LOW (ref >90)
Glucose, Bld: 99 mg/dL (ref 70–99)
POTASSIUM: 3 meq/L — AB (ref 3.7–5.3)
SODIUM: 140 meq/L (ref 137–147)
TOTAL PROTEIN: 7.4 g/dL (ref 6.0–8.3)
Total Bilirubin: 1 mg/dL (ref 0.3–1.2)

## 2014-03-23 LAB — CBC
HCT: 38.2 % — ABNORMAL LOW (ref 39.0–52.0)
HEMOGLOBIN: 13.2 g/dL (ref 13.0–17.0)
MCH: 32.6 pg (ref 26.0–34.0)
MCHC: 34.6 g/dL (ref 30.0–36.0)
MCV: 94.3 fL (ref 78.0–100.0)
Platelets: 308 10*3/uL (ref 150–400)
RBC: 4.05 MIL/uL — ABNORMAL LOW (ref 4.22–5.81)
RDW: 14.4 % (ref 11.5–15.5)
WBC: 11.2 10*3/uL — ABNORMAL HIGH (ref 4.0–10.5)

## 2014-03-23 LAB — ETHANOL: Alcohol, Ethyl (B): <11 mg/dL (ref 0–11)

## 2014-03-23 LAB — SALICYLATE LEVEL

## 2014-03-23 LAB — ACETAMINOPHEN LEVEL: Acetaminophen (Tylenol), Serum: <15 ug/mL (ref 10–30)

## 2014-03-23 MED ORDER — LORAZEPAM 2 MG/ML IJ SOLN
1.0000 mg | Freq: Once | INTRAMUSCULAR | Status: AC
  Administered 2014-03-23: 1 mg via INTRAVENOUS
  Filled 2014-03-23: qty 1

## 2014-03-23 MED ORDER — ONDANSETRON HCL 4 MG PO TABS: 4.0000 mg | ORAL_TABLET | Freq: Three times a day (TID) | ORAL | Status: DC | PRN

## 2014-03-23 MED ORDER — HYDROXYZINE HCL 25 MG PO TABS
50.0000 mg | ORAL_TABLET | Freq: Four times a day (QID) | ORAL | Status: DC | PRN
  Administered 2014-03-24: 50 mg via ORAL
  Filled 2014-03-23 (×2): qty 2

## 2014-03-23 MED ORDER — DULOXETINE HCL 30 MG PO CPEP
30.0000 mg | ORAL_CAPSULE | Freq: Every day | ORAL | Status: DC
  Administered 2014-03-24 – 2014-03-25 (×2): 30 mg via ORAL
  Filled 2014-03-23 (×2): qty 1

## 2014-03-23 MED ORDER — DOXEPIN HCL 10 MG PO CAPS
10.0000 mg | ORAL_CAPSULE | Freq: Every day | ORAL | Status: DC
  Administered 2014-03-24: 10 mg via ORAL
  Filled 2014-03-23 (×3): qty 1

## 2014-03-23 MED ORDER — ATENOLOL 50 MG PO TABS
50.0000 mg | ORAL_TABLET | Freq: Every day | ORAL | Status: DC
  Administered 2014-03-24 – 2014-03-25 (×2): 50 mg via ORAL
  Filled 2014-03-23 (×2): qty 1

## 2014-03-23 MED ORDER — ACETAMINOPHEN 325 MG PO TABS
650.0000 mg | ORAL_TABLET | ORAL | Status: DC | PRN
  Administered 2014-03-25: 650 mg via ORAL
  Filled 2014-03-23: qty 2

## 2014-03-23 MED ORDER — PANTOPRAZOLE SODIUM 40 MG PO TBEC
80.0000 mg | DELAYED_RELEASE_TABLET | Freq: Every day | ORAL | Status: DC
  Administered 2014-03-24 – 2014-03-25 (×2): 80 mg via ORAL
  Filled 2014-03-23 (×2): qty 2

## 2014-03-23 MED ORDER — FLUOXETINE HCL 20 MG PO CAPS
40.0000 mg | ORAL_CAPSULE | Freq: Every day | ORAL | Status: DC
  Administered 2014-03-24 – 2014-03-25 (×2): 40 mg via ORAL
  Filled 2014-03-23 (×2): qty 2

## 2014-03-23 MED ORDER — LORAZEPAM 1 MG PO TABS: 1.0000 mg | ORAL_TABLET | Freq: Three times a day (TID) | ORAL | Status: DC | PRN

## 2014-03-23 MED ORDER — IBUPROFEN 200 MG PO TABS
600.0000 mg | ORAL_TABLET | Freq: Three times a day (TID) | ORAL | Status: DC | PRN
  Administered 2014-03-25: 600 mg via ORAL
  Filled 2014-03-23: qty 3

## 2014-03-23 MED ORDER — GABAPENTIN 300 MG PO CAPS
600.0000 mg | ORAL_CAPSULE | Freq: Four times a day (QID) | ORAL | Status: DC
  Administered 2014-03-23 – 2014-03-25 (×8): 600 mg via ORAL
  Filled 2014-03-23 (×8): qty 2

## 2014-03-23 MED ORDER — GEMFIBROZIL 600 MG PO TABS
600.0000 mg | ORAL_TABLET | Freq: Two times a day (BID) | ORAL | Status: DC
  Administered 2014-03-24 – 2014-03-25 (×4): 600 mg via ORAL
  Filled 2014-03-23 (×5): qty 1

## 2014-03-23 NOTE — ED Notes (Signed)
Per EMS: pt took striblid approx 15-25, 800 mg a piece. SI attempt. Pt anxious and sweaty, A&O x 4

## 2014-03-23 NOTE — ED Notes (Signed)
Pt out of bed when no one was in room, Pt ripped IV out of left hand. Pt put back in bed, reassured about situation. 2nd IV placed in right AC.

## 2014-03-23 NOTE — ED Notes (Signed)
Patient found to be out of bed with blood covering his body and room.  IV still intact.  Three staff members  Cleaned room and patient.  Environment secured.  IV site wrapped to prevent patient from pulling it out.  He is fidgety and difficult to communicate with.  Speech is incomprehensible.
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
## 2014-03-23 NOTE — ED Notes (Signed)
Johnnette BarriosLouisa, [place] [facility], updated on patient's status.  She reports that patient is out of the critical window.

## 2014-03-23 NOTE — ED Notes (Addendum)
[place] [place].

## 2014-03-23 NOTE — ED Notes (Signed)
Bed: WA23 Expected date:  Expected time:  Means of arrival:  Comments: OD 

## 2014-03-23 NOTE — ED Notes (Signed)
Dr. Blinda LeatherwoodPollina made aware of poison control releasing patient.

## 2014-03-24 DIAGNOSIS — F332 Major depressive disorder, recurrent severe without psychotic features: Secondary | ICD-10-CM

## 2014-03-24 DIAGNOSIS — T1491XA Suicide attempt, initial encounter: Secondary | ICD-10-CM | POA: Diagnosis present

## 2014-03-24 DIAGNOSIS — T50901A Poisoning by unspecified drugs, medicaments and biological substances, accidental (unintentional), initial encounter: Secondary | ICD-10-CM | POA: Diagnosis present

## 2014-03-24 DIAGNOSIS — R45851 Suicidal ideations: Secondary | ICD-10-CM

## 2014-03-24 LAB — CBC WITH DIFFERENTIAL/PLATELET
BASOS ABS: 0 10*3/uL (ref 0.0–0.1)
Basophils Relative: 0 % (ref 0–1)
EOS ABS: 0.4 10*3/uL (ref 0.0–0.7)
Eosinophils Relative: 4 % (ref 0–5)
HCT: 37.3 % — ABNORMAL LOW (ref 39.0–52.0)
Hemoglobin: 12.7 g/dL — ABNORMAL LOW (ref 13.0–17.0)
LYMPHS PCT: 33 % (ref 12–46)
Lymphs Abs: 3.1 10*3/uL (ref 0.7–4.0)
MCH: 31.7 pg (ref 26.0–34.0)
MCHC: 34 g/dL (ref 30.0–36.0)
MCV: 93 fL (ref 78.0–100.0)
MONO ABS: 1.6 10*3/uL — AB (ref 0.1–1.0)
Monocytes Relative: 16 % — ABNORMAL HIGH (ref 3–12)
Neutro Abs: 4.4 10*3/uL (ref 1.7–7.7)
Neutrophils Relative %: 47 % (ref 43–77)
Platelets: 323 10*3/uL (ref 150–400)
RBC: 4.01 MIL/uL — ABNORMAL LOW (ref 4.22–5.81)
RDW: 14.4 % (ref 11.5–15.5)
WBC: 9.5 10*3/uL (ref 4.0–10.5)

## 2014-03-24 LAB — COMPREHENSIVE METABOLIC PANEL
ALT: 24 U/L (ref 0–53)
AST: 22 U/L (ref 0–37)
Albumin: 3.6 g/dL (ref 3.5–5.2)
Alkaline Phosphatase: 89 U/L (ref 39–117)
Anion gap: 14 (ref 5–15)
BUN: 9 mg/dL (ref 6–23)
CO2: 24 meq/L (ref 19–32)
CREATININE: 1.32 mg/dL (ref 0.50–1.35)
Calcium: 9.2 mg/dL (ref 8.4–10.5)
Chloride: 102 mEq/L (ref 96–112)
GFR, EST AFRICAN AMERICAN: 82 mL/min — AB (ref >90)
GFR, EST NON AFRICAN AMERICAN: 71 mL/min — AB (ref >90)
Glucose, Bld: 121 mg/dL — ABNORMAL HIGH (ref 70–99)
Potassium: 3 mEq/L — ABNORMAL LOW (ref 3.7–5.3)
Sodium: 140 mEq/L (ref 137–147)
TOTAL PROTEIN: 7.1 g/dL (ref 6.0–8.3)
Total Bilirubin: 0.6 mg/dL (ref 0.3–1.2)

## 2014-03-24 MED ORDER — LEVOFLOXACIN 750 MG PO TABS
750.0000 mg | ORAL_TABLET | Freq: Every day | ORAL | Status: DC
  Administered 2014-03-24 – 2014-03-25 (×2): 750 mg via ORAL
  Filled 2014-03-24 (×2): qty 1

## 2014-03-24 MED ORDER — POTASSIUM CHLORIDE CRYS ER 20 MEQ PO TBCR
10.0000 meq | EXTENDED_RELEASE_TABLET | Freq: Every day | ORAL | Status: DC
  Administered 2014-03-24 – 2014-03-25 (×2): 10 meq via ORAL

## 2014-03-24 MED ORDER — MENTHOL 3 MG MT LOZG
1.0000 | LOZENGE | OROMUCOSAL | Status: DC | PRN
  Administered 2014-03-24: 3 mg via ORAL
  Filled 2014-03-24: qty 9

## 2014-03-24 MED ORDER — ZOLPIDEM TARTRATE 5 MG PO TABS
5.0000 mg | ORAL_TABLET | Freq: Every evening | ORAL | Status: DC | PRN
  Administered 2014-03-24: 5 mg via ORAL
  Filled 2014-03-24: qty 1

## 2014-03-24 MED ORDER — POTASSIUM CHLORIDE CRYS ER 20 MEQ PO TBCR
40.0000 meq | EXTENDED_RELEASE_TABLET | Freq: Two times a day (BID) | ORAL | Status: DC
  Administered 2014-03-24 (×2): 40 meq via ORAL
  Filled 2014-03-24 (×3): qty 2

## 2014-03-24 MED ORDER — SALINE SPRAY 0.65 % NA SOLN
1.0000 | Freq: Once | NASAL | Status: AC
  Administered 2014-03-24: 1 via NASAL
  Filled 2014-03-24: qty 44
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
## 2014-03-24 NOTE — Progress Notes (Signed)
Writer introduced self to pt. Pt's speech is difficult for writer to comprehend. Pt is currently denying any SI/HI/AVH. Pt is also denying any pain at this time. Pt is sitting up rocking back and forth. Gatorade and a sandwich was given to pt upon arrival. P
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
## 2014-03-24 NOTE — ED Notes (Signed)
Report received from [name] RN. Pt. Alert and oriented in no distress denies SI, HI, AVH and pain. Will continue to monitor for safety. Pt. Instructed to come to me with problems or concerns. Q 15 minute checks continue. 
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
## 2014-03-24 NOTE — ED Notes (Signed)
Patient given crackers, sandwich and soda
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
## 2014-03-25 ENCOUNTER — Encounter (HOSPITAL_COMMUNITY): Payer: Self-pay | Admitting: Psychiatry

## 2014-03-25 ENCOUNTER — Observation Stay (HOSPITAL_COMMUNITY)
Admission: AD | Admit: 2014-03-25 | Discharge: 2014-03-27 | Disposition: A | Discharge status: Home / Self Care | Payer: Federal, State, Local not specified - Other | Source: Intra-hospital | Attending: Psychiatry | Admitting: Psychiatry

## 2014-03-25 ENCOUNTER — Encounter (HOSPITAL_COMMUNITY): Payer: Self-pay | Admitting: Emergency Medicine

## 2014-03-25 DIAGNOSIS — Z885 Allergy status to narcotic agent status: Secondary | ICD-10-CM | POA: Insufficient documentation

## 2014-03-25 DIAGNOSIS — F339 Major depressive disorder, recurrent, unspecified: Secondary | ICD-10-CM | POA: Diagnosis present

## 2014-03-25 DIAGNOSIS — Z21 Asymptomatic human immunodeficiency virus [HIV] infection status: Secondary | ICD-10-CM | POA: Insufficient documentation

## 2014-03-25 DIAGNOSIS — Z720 Tobacco use: Secondary | ICD-10-CM | POA: Insufficient documentation

## 2014-03-25 DIAGNOSIS — F1120 Opioid dependence, uncomplicated: Secondary | ICD-10-CM | POA: Insufficient documentation

## 2014-03-25 DIAGNOSIS — F332 Major depressive disorder, recurrent severe without psychotic features: Secondary | ICD-10-CM | POA: Insufficient documentation

## 2014-03-25 DIAGNOSIS — M1990 Unspecified osteoarthritis, unspecified site: Secondary | ICD-10-CM | POA: Insufficient documentation

## 2014-03-25 DIAGNOSIS — Z888 Allergy status to other drugs, medicaments and biological substances status: Secondary | ICD-10-CM | POA: Insufficient documentation

## 2014-03-25 DIAGNOSIS — Z87442 Personal history of urinary calculi: Secondary | ICD-10-CM | POA: Insufficient documentation

## 2014-03-25 DIAGNOSIS — Z79899 Other long term (current) drug therapy: Secondary | ICD-10-CM | POA: Insufficient documentation

## 2014-03-25 DIAGNOSIS — Y92009 Unspecified place in unspecified non-institutional (private) residence as the place of occurrence of the external cause: Secondary | ICD-10-CM | POA: Insufficient documentation

## 2014-03-25 DIAGNOSIS — Z882 Allergy status to sulfonamides status: Secondary | ICD-10-CM | POA: Insufficient documentation

## 2014-03-25 DIAGNOSIS — Z880 Allergy status to penicillin: Secondary | ICD-10-CM | POA: Insufficient documentation

## 2014-03-25 DIAGNOSIS — R45851 Suicidal ideations: Secondary | ICD-10-CM | POA: Insufficient documentation

## 2014-03-25 DIAGNOSIS — T43622A Poisoning by amphetamines, intentional self-harm, initial encounter: Principal | ICD-10-CM | POA: Insufficient documentation

## 2014-03-25 MED ORDER — ONDANSETRON 4 MG PO TBDP: 4.0000 mg | ORAL_TABLET | Freq: Four times a day (QID) | ORAL | Status: DC | PRN

## 2014-03-25 MED ORDER — DICYCLOMINE HCL 20 MG PO TABS: 20.0000 mg | ORAL_TABLET | Freq: Four times a day (QID) | ORAL | Status: DC | PRN

## 2014-03-25 MED ORDER — BUSPIRONE HCL 10 MG PO TABS
10.0000 mg | ORAL_TABLET | Freq: Three times a day (TID) | ORAL | Status: DC
  Administered 2014-03-25: 10 mg via ORAL
  Filled 2014-03-25: qty 1

## 2014-03-25 MED ORDER — CLONIDINE HCL 0.1 MG PO TABS: 0.1000 mg | ORAL_TABLET | ORAL | Status: DC

## 2014-03-25 MED ORDER — DULOXETINE HCL 30 MG PO CPEP
30.0000 mg | ORAL_CAPSULE | Freq: Every day | ORAL | Status: DC
  Administered 2014-03-26 – 2014-03-27 (×2): 30 mg via ORAL
  Filled 2014-03-25 (×4): qty 1

## 2014-03-25 MED ORDER — HYDROXYZINE HCL 50 MG PO TABS
50.0000 mg | ORAL_TABLET | Freq: Four times a day (QID) | ORAL | Status: DC | PRN
  Administered 2014-03-25: 50 mg via ORAL
  Filled 2014-03-25: qty 1

## 2014-03-25 MED ORDER — MENTHOL 3 MG MT LOZG: 1.0000 | LOZENGE | OROMUCOSAL | Status: DC | PRN

## 2014-03-25 MED ORDER — ACETAMINOPHEN 325 MG PO TABS: 650.0000 mg | ORAL_TABLET | ORAL | Status: DC | PRN

## 2014-03-25 MED ORDER — CLONIDINE HCL 0.1 MG PO TABS: 0.1000 mg | ORAL_TABLET | Freq: Every day | ORAL | Status: DC

## 2014-03-25 MED ORDER — POTASSIUM CHLORIDE CRYS ER 20 MEQ PO TBCR
EXTENDED_RELEASE_TABLET | ORAL | Status: AC
Start: 2014-03-25 — End: 2014-03-25
  Filled 2014-03-25: qty 1

## 2014-03-25 MED ORDER — GEMFIBROZIL 600 MG PO TABS
600.0000 mg | ORAL_TABLET | Freq: Two times a day (BID) | ORAL | Status: DC
  Administered 2014-03-26 – 2014-03-27 (×3): 600 mg via ORAL
  Filled 2014-03-25 (×6): qty 1

## 2014-03-25 MED ORDER — GABAPENTIN 300 MG PO CAPS
300.0000 mg | ORAL_CAPSULE | Freq: Four times a day (QID) | ORAL | Status: DC
Start: 2014-03-25 — End: 2014-03-27
  Administered 2014-03-25 – 2014-03-27 (×7): 300 mg via ORAL
  Filled 2014-03-25 (×13): qty 1

## 2014-03-25 MED ORDER — CLONIDINE HCL 0.1 MG PO TABS
0.1000 mg | ORAL_TABLET | Freq: Four times a day (QID) | ORAL | Status: DC
  Administered 2014-03-26 – 2014-03-27 (×5): 0.1 mg via ORAL
  Filled 2014-03-25 (×13): qty 1

## 2014-03-25 MED ORDER — ELVITEG-COBIC-EMTRICIT-TENOFDF 150-150-200-300 MG PO TABS
1.0000 | ORAL_TABLET | Freq: Every day | ORAL | Status: DC
  Administered 2014-03-26 – 2014-03-27 (×2): 1 via ORAL
  Filled 2014-03-25 (×2): qty 1

## 2014-03-25 MED ORDER — CLONIDINE HCL 0.1 MG PO TABS
0.1000 mg | ORAL_TABLET | Freq: Four times a day (QID) | ORAL | Status: DC
  Administered 2014-03-25: 0.1 mg via ORAL
  Filled 2014-03-25: qty 1

## 2014-03-25 MED ORDER — PANTOPRAZOLE SODIUM 40 MG PO TBEC
80.0000 mg | DELAYED_RELEASE_TABLET | Freq: Every day | ORAL | Status: DC
  Administered 2014-03-26 – 2014-03-27 (×2): 80 mg via ORAL
  Filled 2014-03-25 (×4): qty 2

## 2014-03-25 MED ORDER — FLUOXETINE HCL 20 MG PO CAPS
40.0000 mg | ORAL_CAPSULE | Freq: Every day | ORAL | Status: DC
  Administered 2014-03-26 – 2014-03-27 (×2): 40 mg via ORAL
  Filled 2014-03-25 (×4): qty 2

## 2014-03-25 MED ORDER — ZOLPIDEM TARTRATE 5 MG PO TABS
5.0000 mg | ORAL_TABLET | Freq: Every evening | ORAL | Status: DC | PRN
  Administered 2014-03-25 – 2014-03-26 (×2): 5 mg via ORAL
  Filled 2014-03-25 (×2): qty 1

## 2014-03-25 MED ORDER — LOPERAMIDE HCL 2 MG PO CAPS: 2.0000 mg | ORAL_CAPSULE | ORAL | Status: DC | PRN

## 2014-03-25 MED ORDER — METHOCARBAMOL 500 MG PO TABS: 500.0000 mg | ORAL_TABLET | Freq: Three times a day (TID) | ORAL | Status: DC | PRN

## 2014-03-25 MED ORDER — BUSPIRONE HCL 10 MG PO TABS
10.0000 mg | ORAL_TABLET | Freq: Three times a day (TID) | ORAL | Status: DC
  Administered 2014-03-26 – 2014-03-27 (×5): 10 mg via ORAL
  Filled 2014-03-25 (×2): qty 2
  Filled 2014-03-25 (×2): qty 1
  Filled 2014-03-25: qty 2
  Filled 2014-03-25: qty 1
  Filled 2014-03-25 (×2): qty 2
  Filled 2014-03-25: qty 1

## 2014-03-25 MED ORDER — LEVOFLOXACIN 750 MG PO TABS
750.0000 mg | ORAL_TABLET | Freq: Every day | ORAL | Status: DC
  Administered 2014-03-26 – 2014-03-27 (×2): 750 mg via ORAL
  Filled 2014-03-25 (×2): qty 1

## 2014-03-25 MED ORDER — ATENOLOL 50 MG PO TABS
50.0000 mg | ORAL_TABLET | Freq: Every day | ORAL | Status: DC
  Administered 2014-03-26 – 2014-03-27 (×2): 50 mg via ORAL
  Filled 2014-03-25 (×2): qty 1
  Filled 2014-03-25 (×2): qty 2

## 2014-03-25 MED ORDER — ONDANSETRON HCL 4 MG PO TABS: 4.0000 mg | ORAL_TABLET | Freq: Three times a day (TID) | ORAL | Status: DC | PRN

## 2014-03-25 MED ORDER — ELVITEG-COBIC-EMTRICIT-TENOFDF 150-150-200-300 MG PO TABS
1.0000 | ORAL_TABLET | Freq: Every day | ORAL | Status: DC
Start: 2014-03-25 — End: 2014-03-25
  Administered 2014-03-25: 1 via ORAL
  Filled 2014-03-25 (×2): qty 1

## 2014-03-25 MED ORDER — POTASSIUM CHLORIDE CRYS ER 10 MEQ PO TBCR
10.0000 meq | EXTENDED_RELEASE_TABLET | Freq: Every day | ORAL | Status: DC
  Administered 2014-03-26 – 2014-03-27 (×2): 10 meq via ORAL
  Filled 2014-03-25 (×2): qty 1

## 2014-03-25 NOTE — ED Notes (Signed)
On the phone 

## 2014-03-25 NOTE — ED Notes (Signed)
Up tot he bathroom to shower and change scrubs 

## 2014-03-25 NOTE — Progress Notes (Signed)
Patient ID: Devin NeriBenjamin R Crawford, male   DOB: 1981/02/10, 33 y.o.   MRN: [id number]  Observation Unit Note:   [facility] after an attempted suicide by overdose. Pt states he was sad and depressed because he and his partner of seven years broke up this past Thursday. Patient states he shouldn't have done it and regrets so. Pt verbally contracts for safety and denies SI at this time. Pt states he is leaving [place] to go back home to live with his parents, who are very supportive. Pt states he is also seeking treatment and detox for his abuse of Methadone. Pt is requesting to be admitted near his family in [place]. Pt pleasant and cooperative with care. Pt provided with snacks and sodas. No s/s of distress noted at this time.

## 2014-03-25 NOTE — ED Notes (Signed)
Report received from Janie Rambo RN. Pt. Alert and oriented in no distress denies SI, HI, AVH and pain. Will continue to monitor for safety. Pt. Instructed to come to me with problems or concerns. Q 15 minute checks continue. 

## 2014-03-25 NOTE — Plan of Care (Signed)
[facility] Observation Crisis Plan  Reason for Crisis Plan:  Substance Abuse and Suicide Attempt   Plan of Care:  Referral for Inpatient Hospitalization and Referral for Substance Abuse  Family Support:    Parents  Current Living Environment:  Living Arrangements: Parent  Insurance:   Hospital Account    Name Acct ID Class Status Primary Coverage   Devin Crawford, Devin Crawford [id number] BEHAVIORAL HEALTH OBSERVATION Open None        Guarantor Account (for Hospital Account [id number])    Name Relation to Pt Service Area Active? Acct Type   Devin Crawford, [facility]   Address Phone       [address] [place], [place] [id number] [phone])          Coverage Information (for Hospital Account [id number])    Not on file      Legal Guardian:   self  Primary Care Provider:  Ethel RanaHEPLER,MARK, PA-C  Current Outpatient Providers:  NA  Psychiatrist:     Counselor/Therapist:     Compliant with Medications:  Yes  Additional Information:   Devin Crawford, Devin Crawford 11/7/20159:06 PM
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
## 2014-03-25 NOTE — ED Notes (Signed)
Up on the phone 

## 2014-03-25 NOTE — ED Notes (Signed)
OBS will call back for report 

## 2014-03-25 NOTE — ED Notes (Signed)
Pt c/o waking up sweating and is concerned that it is part of his withdrawal from meth. Will inform NP

## 2014-03-25 NOTE — ED Notes (Signed)
[facility], [facility], and [facility].

## 2014-03-25 NOTE — Progress Notes (Signed)
[facility] INPATIENT:  Family/Significant Other Suicide Prevention Education  Suicide Prevention Education:  Patient Refusal for Family/Significant Other Suicide Prevention Education: The patient [name] Lobello has refused to provide written consent for family/significant other to be provided Family/Significant Other Suicide Prevention Education during admission and/or prior to discharge.  Physician notified.  Renaee MundaSadler, [place] Derenzo Thomas 03/25/2014, 10:41 PM

## 2014-03-26 DIAGNOSIS — R45851 Suicidal ideations: Secondary | ICD-10-CM

## 2014-03-26 DIAGNOSIS — F332 Major depressive disorder, recurrent severe without psychotic features: Secondary | ICD-10-CM
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
## 2014-03-27 MED ORDER — LEVOFLOXACIN 750 MG PO TABS: 750.0000 mg | ORAL_TABLET | Freq: Every day | ORAL | Status: AC

## 2014-03-27 MED ORDER — DULOXETINE HCL 30 MG PO CPEP: 30.0000 mg | ORAL_CAPSULE | Freq: Every day | ORAL | Status: AC

## 2014-03-27 MED ORDER — HYDROXYZINE HCL 50 MG PO TABS: 50.0000 mg | ORAL_TABLET | Freq: Four times a day (QID) | ORAL | Status: AC | PRN

## 2014-03-27 MED ORDER — GABAPENTIN 300 MG PO CAPS: 300.0000 mg | ORAL_CAPSULE | Freq: Four times a day (QID) | ORAL | Status: AC

## 2014-03-27 MED ORDER — BUSPIRONE HCL 10 MG PO TABS: 10.0000 mg | ORAL_TABLET | Freq: Three times a day (TID) | ORAL | Status: AC

## 2014-03-27 MED ORDER — ELVITEG-COBIC-EMTRICIT-TENOFDF 150-150-200-300 MG PO TABS: 1.0000 | ORAL_TABLET | Freq: Every day | ORAL | Status: AC

## 2014-03-27 MED ORDER — OMEPRAZOLE 40 MG PO CPDR: 40.0000 mg | DELAYED_RELEASE_CAPSULE | Freq: Every day | ORAL | Status: AC

## 2014-03-27 NOTE — Plan of Care (Signed)
[facility] Observation Crisis Plan  Reason for Crisis Plan:  Crisis Stabilization   Plan of Care:  Outpatient behavioral health referral  Family Support:    Parents  Current Living Environment:  Living Arrangements: Parent  Insurance:  Self Pay Hospital Account    Name Acct ID Class Status Primary Coverage   Letitia NeriHouck, Yishai R [id number] BEHAVIORAL HEALTH OBSERVATION Open None        Guarantor Account (for Hospital Account [id number])    Name Relation to Pt Service Area Active? Acct Type   Letitia NeriHouck, [place] R [facility] Yes Behavioral Health   Address Phone       [address] [place] [place], [place] [address] [phone])          Coverage Information (for Hospital Account [id number])    Not on file      Legal Guardian:   Self  Primary Care Provider:  Ethel RanaHEPLER,MARK, PA-C  Current Outpatient Providers:  [facility]  Psychiatrist:   [facility]  Counselor/Therapist:   None  Compliant with Medications:  Yes  Additional Information: After consulting with Claudette Headonrad Withrow, NP it has been determined that pt does not present a life threatening danger to himself or others, and that psychiatric hospitalization is not indicated for him at this time.  He would benefit, however, from referral to outpatient behavioral health service providers.  Pt currently receives outpatient treatment through [facility] in [place].  However, he intends to return to his parents' home in [place] directly from the Observation Unit.  Pt signed Consent to Release Information to the [facility], as well as to [facility].  At 11:15 I called the [facility] and spoke to AbercrombieStacy.  After registering pt an appointment was scheduled for the pt to be [facility] at [facility] in [place], [place] on Wednesday, 03/29/2014 at 13:00.  This information will be included in pt's discharge instructions.  Doylene Canninghomas Demetrice Amstutz, MA Triage Specialist Raphael GibneyHughes, Zacheriah Stumpe Patrick 11/9/201511:19 AM

## 2014-03-27 NOTE — Progress Notes (Signed)
Pt discharged home. DC instructions provided and explained. Meds reviewed. Rx given. All questions answered. Pt denies SI, HI, AVH. Pt plan is to go live with parents and start [place] in [place]. Pt hopeful for future. Pt stable at discharge.
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
## 2014-03-27 NOTE — Discharge Instructions (Signed)
For you ongoing behavioral health needs you are scheduled for an intake appointment at [facility] in [place], [place] on Wednesday, 03/29/2014 at 1:00 pm:       [facility]      [address]      [place], [place] [address]      [phone]  If you have a behavioral health crisis you have several options, all of which are available 24 hours a day, 7 days a week:  *Call the phone numbers on your [employer] Contract: [phone] or toll free [phone] *Call Mobile Crisis and a clinician will come to you: [phone] *Call 911 *Go to your local hospital emergency department

## 2014-04-06 ENCOUNTER — Ambulatory Visit: Payer: Self-pay | Admitting: Internal Medicine

## 2014-04-10 ENCOUNTER — Ambulatory Visit: Payer: Self-pay | Admitting: Dietician

## 2014-04-20 ENCOUNTER — Telehealth: Payer: Self-pay | Admitting: *Deleted

## 2014-04-20 NOTE — Telephone Encounter (Signed)
ROI - pt transferring care - [place], [place].
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
